# Patient Record
Sex: Female | Born: 1955 | Hispanic: No | Smoking: Former smoker
Health system: Southern US, Community
[De-identification: ages and names within clinical notes are randomized; demographics above are authoritative.]

## PROBLEM LIST (undated history)

## (undated) DIAGNOSIS — N3281 Overactive bladder: Secondary | ICD-10-CM

## (undated) DIAGNOSIS — E785 Hyperlipidemia, unspecified: Secondary | ICD-10-CM

## (undated) DIAGNOSIS — T7840XA Allergy, unspecified, initial encounter: Secondary | ICD-10-CM

## (undated) DIAGNOSIS — M8580 Other specified disorders of bone density and structure, unspecified site: Secondary | ICD-10-CM

## (undated) DIAGNOSIS — S42009A Fracture of unspecified part of unspecified clavicle, initial encounter for closed fracture: Secondary | ICD-10-CM

## (undated) DIAGNOSIS — D649 Anemia, unspecified: Secondary | ICD-10-CM

## (undated) DIAGNOSIS — J45909 Unspecified asthma, uncomplicated: Secondary | ICD-10-CM

## (undated) DIAGNOSIS — D72819 Decreased white blood cell count, unspecified: Secondary | ICD-10-CM

## (undated) HISTORY — DX: Hyperlipidemia, unspecified: E78.5

## (undated) HISTORY — DX: Decreased white blood cell count, unspecified: D72.819

## (undated) HISTORY — PX: ABDOMINAL HYSTERECTOMY: SHX81

## (undated) HISTORY — DX: Allergy, unspecified, initial encounter: T78.40XA

## (undated) HISTORY — DX: Other specified disorders of bone density and structure, unspecified site: M85.80

## (undated) HISTORY — DX: Unspecified asthma, uncomplicated: J45.909

---

## 2000-11-09 ENCOUNTER — Other Ambulatory Visit: Admission: RE | Admit: 2000-11-09 | Discharge: 2000-11-09 | Payer: Self-pay | Admitting: Family Medicine

## 2005-07-21 ENCOUNTER — Ambulatory Visit (HOSPITAL_COMMUNITY): Admission: RE | Admit: 2005-07-21 | Discharge: 2005-07-21 | Payer: Self-pay | Admitting: Gastroenterology

## 2005-09-10 ENCOUNTER — Ambulatory Visit: Payer: Self-pay | Admitting: Internal Medicine

## 2006-08-18 ENCOUNTER — Encounter: Payer: Self-pay | Admitting: Internal Medicine

## 2011-10-09 ENCOUNTER — Telehealth: Payer: Self-pay

## 2011-10-09 NOTE — Telephone Encounter (Signed)
I will set up the colonoscopy but I have to have a reason

## 2011-10-09 NOTE — Telephone Encounter (Signed)
Pt is going to come into the walkin clinic on 7/24 to see dr Milus Glazier. But pt wants to know if dr can go ahead and make a referral for the patient to have a colonoscopy Please call pt to advise

## 2011-10-10 NOTE — Telephone Encounter (Signed)
Left message on machine letting patient know we can set up a colonoscopy but to call back.  When patient calls back please ask if patient has had any GI symptoms or an abnormal colonoscopy in the past.

## 2011-10-10 NOTE — Telephone Encounter (Signed)
Pt CB and LM on nurse VM. Tried to call pt back but had to [facility] to CB

## 2011-10-22 ENCOUNTER — Ambulatory Visit (INDEPENDENT_AMBULATORY_CARE_PROVIDER_SITE_OTHER): Payer: BC Managed Care – PPO | Admitting: Family Medicine

## 2011-10-22 VITALS — BP 126/78 | HR 59 | Temp 98.9°F | Resp 16 | Ht 66.25 in | Wt 151.6 lb

## 2011-10-22 DIAGNOSIS — Z0000 Encounter for general adult medical examination without abnormal findings: Secondary | ICD-10-CM

## 2011-10-22 DIAGNOSIS — K635 Polyp of colon: Secondary | ICD-10-CM

## 2011-10-22 LAB — COMPREHENSIVE METABOLIC PANEL
ALT: 16 U/L (ref 0–35)
AST: 17 U/L (ref 0–37)
Albumin: 4.3 g/dL (ref 3.5–5.2)
Alkaline Phosphatase: 51 U/L (ref 39–117)
BUN: 12 mg/dL (ref 6–23)
CO2: 27 mEq/L (ref 19–32)
Calcium: 9.4 mg/dL (ref 8.4–10.5)
Chloride: 103 mEq/L (ref 96–112)
Creat: 0.6 mg/dL (ref 0.50–1.10)
Glucose, Bld: 84 mg/dL (ref 70–99)
Potassium: 4.1 mEq/L (ref 3.5–5.3)
Sodium: 137 mEq/L (ref 135–145)
Total Bilirubin: 0.5 mg/dL (ref 0.3–1.2)
Total Protein: 7.1 g/dL (ref 6.0–8.3)

## 2011-10-22 LAB — POCT CBC
Granulocyte percent: 64.8 %G (ref 37–80)
HCT, POC: 42.5 % (ref 37.7–47.9)
Hemoglobin: 12.9 g/dL (ref 12.2–16.2)
Lymph, poc: 1.3 (ref 0.6–3.4)
MCH, POC: 27.7 pg (ref 27–31.2)
MCHC: 30.4 g/dL — AB (ref 31.8–35.4)
MCV: 91.1 fL (ref 80–97)
MID (cbc): 0.2 (ref 0–0.9)
MPV: 8.7 fL (ref 0–99.8)
POC Granulocyte: 2.8 (ref 2–6.9)
POC LYMPH PERCENT: 30.3 %L (ref 10–50)
POC MID %: 4.9 %M (ref 0–12)
Platelet Count, POC: 311 10*3/uL (ref 142–424)
RBC: 4.66 M/uL (ref 4.04–5.48)
RDW, POC: 14.5 %
WBC: 4.3 10*3/uL — AB (ref 4.6–10.2)

## 2011-10-22 LAB — POCT UA - MICROSCOPIC ONLY
Casts, Ur, LPF, POC: NEGATIVE
Crystals, Ur, HPF, POC: NEGATIVE
Yeast, UA: NEGATIVE

## 2011-10-22 LAB — POCT URINALYSIS DIPSTICK
Bilirubin, UA: NEGATIVE
Blood, UA: NEGATIVE
Glucose, UA: NEGATIVE
Ketones, UA: NEGATIVE
Leukocytes, UA: NEGATIVE
Nitrite, UA: NEGATIVE
Protein, UA: NEGATIVE
Spec Grav, UA: 1.02
Urobilinogen, UA: 0.2
pH, UA: 5

## 2011-10-22 LAB — LIPID PANEL
Cholesterol: 272 mg/dL — ABNORMAL HIGH (ref 0–200)
HDL: 76 mg/dL (ref >39)
LDL Cholesterol: 163 mg/dL — ABNORMAL HIGH (ref 0–99)
Total CHOL/HDL Ratio: 3.6 Ratio
Triglycerides: 167 mg/dL — ABNORMAL HIGH (ref <150)
VLDL: 33 mg/dL (ref 0–40)

## 2011-10-22 MED ORDER — GLUCOSAMINE-CHONDROITIN 500-400 MG PO TABS: 1.0000 | ORAL_TABLET | ORAL | Status: DC

## 2011-10-22 MED ORDER — ESTRADIOL 2 MG PO TABS: 2.0000 mg | ORAL_TABLET | Freq: Every day | ORAL | Status: DC

## 2011-10-22 NOTE — Progress Notes (Signed)
@[email or web address] @  Patient ID: RUT BETTERTON MRN: [id number], DOB: 13-Oct-1955, 56 y.o. Date of Encounter: 10/22/2011, 9:11 AM  Primary Physician: No primary provider on file.  Chief Complaint: Physical (CPE)  HPI: 56 y.o. y/o female with history of noted below here for CPE.  Doing well. No issues/complaints.   Review of Systems: Consitutional: No fever, chills, fatigue, night sweats, lymphadenopathy, or weight changes. Eyes: No visual changes, eye redness, or discharge. ENT/Mouth: Ears: No otalgia, tinnitus, hearing loss, discharge. Nose: No congestion, rhinorrhea, sinus pain, or epistaxis. Throat: No sore throat, post nasal drip, or teeth pain. Cardiovascular: No CP, palpitations, diaphoresis, DOE, edema, orthopnea, PND. Respiratory: No cough, hemoptysis, SOB, or wheezing. Gastrointestinal: No anorexia, dysphagia, reflux, pain, nausea, vomiting, hematemesis, diarrhea, constipation, BRBPR, or melena. Breast: No discharge, pain, swelling, or mass. Genitourinary: No dysuria, frequency, urgency, hematuria, incontinence, nocturia, amenorrhea, vaginal discharge, pruritis, burning, abnormal bleeding, or pain. Musculoskeletal: No decreased ROM, myalgias, stiffness, joint swelling, or weakness. Skin: No rash, erythema, lesion changes, pain, warmth, jaundice, or pruritis. Neurological: No headache, dizziness, syncope, seizures, tremors, memory loss, coordination problems, or paresthesias. Psychological: No anxiety, depression, hallucinations, SI/HI. Endocrine: No fatigue, polydipsia, polyphagia, polyuria, or known diabetes. All other systems were reviewed and are otherwise negative.  No past medical history on file.   Past Surgical History  Procedure Date  . Abdominal hysterectomy     Home Meds:  Prior to Admission medications   Medication Sig Start Date End Date Taking? Authorizing Provider  estradiol (ESTRACE) 2 MG tablet Take 2 mg by mouth daily.   Yes Historical Provider, MD    glucosamine-chondroitin 500-400 MG tablet Take 1 tablet by mouth once a week.   Yes Historical Provider, MD    Allergies:  Allergies  Allergen Reactions  . Codeine     History   Social History  . Marital Status: Divorced    Spouse Name: N/A    Number of Children: N/A  . Years of Education: N/A   Occupational History  . Not on file.   Social History Main Topics  . Smoking status: Former Smoker    Quit date: 10/22/1991  . Smokeless tobacco: Not on file  . Alcohol Use: Not on file  . Drug Use: Not on file  . Sexually Active: Not on file   Other Topics Concern  . Not on file   Social History Narrative  . No narrative on file    Family History  Problem Relation Age of Onset  . Hyperlipidemia Mother   . Cancer Father     Physical Exam: Blood pressure 126/78, pulse 59, temperature 98.9 F (37.2 C), temperature source Oral, resp. rate 16, height 5' 6.25" (1.683 m), weight 151 lb 9.6 oz (68.765 kg), SpO2 98.00%., Body mass index is 24.28 kg/(m^2). General: Well developed, well nourished, in no acute distress. HEENT: Normocephalic, atraumatic. Conjunctiva pink, sclera non-icteric. Pupils 2 mm constricting to 1 mm, round, regular, and equally reactive to light and accomodation. EOMI. Internal auditory canal clear. TMs with good cone of light and without pathology. Nasal mucosa pink. Nares are without discharge. No sinus tenderness. Oral mucosa pink. Dentition good. Pharynx without exudate.   Neck: Supple. Trachea midline. No thyromegaly. Full ROM. No lymphadenopathy. Lungs: Clear to auscultation bilaterally without wheezes, rales, or rhonchi. Breathing is of normal effort and unlabored. Cardiovascular: RRR with S1 S2. No murmurs, rubs, or gallops appreciated. Distal pulses 2+ symmetrically. No carotid or abdominal bruits. Breast: Symmetrical. No masses. Nipples without discharge. Abdomen: Soft, non-tender, non-distended  with normoactive bowel sounds. No hepatosplenomegaly or  masses. No rebound/guarding. No CVA tenderness. Without hernias.   Musculoskeletal: Full range of motion and 5/5 strength throughout. Without swelling, atrophy, tenderness, crepitus, or warmth. Extremities without clubbing, cyanosis, or edema. Calves supple. Skin: Warm and moist without erythema, ecchymosis, wounds, or rash. Neuro: A+Ox3. CN II-XII grossly intact. Moves all extremities spontaneously. Full sensation throughout. Normal gait. DTR 2+ throughout upper and lower extremities. Finger to nose intact. Psych:  Responds to questions appropriately with a normal affect.   Studies: CMET  And lipidspending. Patient is doing well at present Results for orders placed in visit on 10/22/11  POCT CBC      Component Value Range   WBC 4.3 (*) 4.6 - 10.2 K/uL   Lymph, poc 1.3  0.6 - 3.4   POC LYMPH PERCENT 30.3  10 - 50 %L   MID (cbc) 0.2  0 - 0.9   POC MID % 4.9  0 - 12 %M   POC Granulocyte 2.8  2 - 6.9   Granulocyte percent 64.8  37 - 80 %G   RBC 4.66  4.04 - 5.48 M/uL   Hemoglobin 12.9  12.2 - 16.2 g/dL   HCT, POC 16.1  09.6 - 47.9 %   MCV 91.1  80 - 97 fL   MCH, POC 27.7  27 - 31.2 pg   MCHC 30.4 (*) 31.8 - 35.4 g/dL   RDW, POC 04.5     Platelet Count, POC 311  142 - 424 K/uL   MPV 8.7  0 - 99.8 fL  POCT UA - MICROSCOPIC ONLY      Component Value Range   WBC, Ur, HPF, POC 1-2     RBC, urine, microscopic 0-2     Bacteria, U Microscopic small     Mucus, UA tr     Epithelial cells, urine per micros 1-3     Crystals, Ur, HPF, POC neg     Casts, Ur, LPF, POC neg     Yeast, UA neg    POCT URINALYSIS DIPSTICK      Component Value Range   Color, UA yellow     Clarity, UA clear     Glucose, UA neg     Bilirubin, UA neg     Ketones, UA neg     Spec Grav, UA 1.020     Blood, UA neg     pH, UA 5.0     Protein, UA neg     Urobilinogen, UA 0.2     Nitrite, UA neg     Leukocytes, UA Negative       Assessment/Plan:  56 y.o. y/o female here for CPE with no new  problems -  Signed, Elvina Sidle, MD 10/22/2011 9:11 AM

## 2012-01-10 ENCOUNTER — Other Ambulatory Visit: Payer: Self-pay | Admitting: Family Medicine

## 2012-01-12 NOTE — Telephone Encounter (Signed)
Please pull paper chart.  

## 2012-01-13 NOTE — Telephone Encounter (Signed)
Chart pulled to PA pool at nurses station [phone]

## 2012-06-14 ENCOUNTER — Encounter: Payer: Self-pay | Admitting: Family Medicine

## 2012-11-11 ENCOUNTER — Encounter: Payer: Self-pay | Admitting: Family Medicine

## 2012-11-11 ENCOUNTER — Ambulatory Visit (INDEPENDENT_AMBULATORY_CARE_PROVIDER_SITE_OTHER): Payer: BC Managed Care – PPO | Admitting: Family Medicine

## 2012-11-11 VITALS — BP 113/75 | HR 83 | Temp 97.3°F | Resp 16 | Ht 65.5 in | Wt 154.0 lb

## 2012-11-11 DIAGNOSIS — Z0000 Encounter for general adult medical examination without abnormal findings: Secondary | ICD-10-CM

## 2012-11-11 LAB — CBC
HCT: 43.1 % (ref 36.0–46.0)
Hemoglobin: 14.6 g/dL (ref 12.0–15.0)
MCH: 28.6 pg (ref 26.0–34.0)
MCHC: 33.9 g/dL (ref 30.0–36.0)
MCV: 84.3 fL (ref 78.0–100.0)
Platelets: 347 10*3/uL (ref 150–400)
RBC: 5.11 MIL/uL (ref 3.87–5.11)
RDW: 13.8 % (ref 11.5–15.5)
WBC: 4.2 10*3/uL (ref 4.0–10.5)

## 2012-11-11 LAB — COMPREHENSIVE METABOLIC PANEL
ALT: 17 U/L (ref 0–35)
AST: 15 U/L (ref 0–37)
Albumin: 4.2 g/dL (ref 3.5–5.2)
Alkaline Phosphatase: 63 U/L (ref 39–117)
BUN: 11 mg/dL (ref 6–23)
CO2: 24 mEq/L (ref 19–32)
Calcium: 9.5 mg/dL (ref 8.4–10.5)
Chloride: 102 mEq/L (ref 96–112)
Creat: 0.68 mg/dL (ref 0.50–1.10)
Glucose, Bld: 90 mg/dL (ref 70–99)
Potassium: 4.3 mEq/L (ref 3.5–5.3)
Sodium: 135 mEq/L (ref 135–145)
Total Bilirubin: 0.4 mg/dL (ref 0.3–1.2)
Total Protein: 7 g/dL (ref 6.0–8.3)

## 2012-11-11 LAB — LIPID PANEL
Cholesterol: 279 mg/dL — ABNORMAL HIGH (ref 0–200)
HDL: 80 mg/dL (ref >39)
LDL Cholesterol: 171 mg/dL — ABNORMAL HIGH (ref 0–99)
Total CHOL/HDL Ratio: 3.5 Ratio
Triglycerides: 138 mg/dL (ref <150)
VLDL: 28 mg/dL (ref 0–40)

## 2012-11-11 NOTE — Progress Notes (Signed)
Patient ID: Megan Hooper MRN: [id number], DOB: March 31, 1956, 57 y.o. Date of Encounter: 11/11/2012, 11:47 AM  Primary Physician: No primary provider on file.  Chief Complaint: Physical (CPE)  HPI: 57 y.o. y/o female with history of noted below here for CPE.  Patient was engaged last year but she found that her future partner was a heavy drinker and one thing led to another and the marriage was called off. She is raising 2 adopted daughters, her nieces (brother died of drug addiction), and she's a Runner, broadcasting/film/video at [employer]. Her daughters will be attending [employer] this year. No new problems at this point  Years been very stressful because she hadn't leave the house she had bought with her proposed future partner, and her finances were quite strained. LMP:  S/P hysterectomy MMG:  Within a year Colonoscopy:  Referred but because of stress, postponed it.  Review of Systems: Consitutional: No fever, chills, fatigue, night sweats, lymphadenopathy, or weight changes. Eyes: No visual changes, eye redness, or discharge. ENT/Mouth: Ears: No otalgia, tinnitus, hearing loss, discharge. Nose: No congestion, rhinorrhea, sinus pain, or epistaxis. Throat: No sore throat, post nasal drip, or teeth pain. Cardiovascular: No CP, palpitations, diaphoresis, DOE, edema, orthopnea, PND. Respiratory: No cough, hemoptysis, SOB, or wheezing. Gastrointestinal: No anorexia, dysphagia, reflux, pain, nausea, vomiting, hematemesis, diarrhea, constipation, BRBPR, or melena. Breast: No discharge, pain, swelling, or mass. Genitourinary: No dysuria, frequency, urgency, hematuria, incontinence, nocturia, amenorrhea, vaginal discharge, pruritis, burning, abnormal bleeding, or pain. Musculoskeletal: No decreased ROM, myalgias, stiffness, joint swelling, or weakness. Skin: No rash, erythema, lesion changes, pain, warmth, jaundice, or pruritis. Neurological: No headache, dizziness, syncope, seizures, tremors, memory loss,  coordination problems, or paresthesias. Psychological: No anxiety, depression, hallucinations, SI/HI. Endocrine: No fatigue, polydipsia, polyphagia, polyuria, or known diabetes. All other systems were reviewed and are otherwise negative.  History reviewed. No pertinent past medical history.   Past Surgical History  Procedure Laterality Date  . Abdominal hysterectomy      Home Meds:  Prior to Admission medications   Medication Sig Start Date End Date Taking? Authorizing Provider  estradiol (ESTRACE) 2 MG tablet Take 1 tablet (2 mg total) by mouth daily. 10/22/11  Yes Elvina Sidle, MD  oxybutynin (DITROPAN-XL) 5 MG 24 hr tablet Take 5 mg by mouth daily.   Yes Historical Provider, MD  DITROPAN XL 5 MG 24 hr tablet TAKE 1 TABLET ONCE DAILY. 01/10/12   Heather Jaquita Rector, PA-C  glucosamine-chondroitin 500-400 MG tablet Take 1 tablet by mouth once a week. 10/22/11   Elvina Sidle, MD    Allergies:  Allergies  Allergen Reactions  . Codeine     History   Social History  . Marital Status: Divorced    Spouse Name: N/A    Number of Children: N/A  . Years of Education: N/A   Occupational History  . Not on file.   Social History Main Topics  . Smoking status: Former Smoker    Quit date: 10/22/1991  . Smokeless tobacco: Not on file  . Alcohol Use: Not on file  . Drug Use: Not on file  . Sexual Activity: Not on file   Other Topics Concern  . Not on file   Social History Narrative  . No narrative on file    Family History  Problem Relation Age of Onset  . Hyperlipidemia Mother   . Cancer Father     Physical Exam: Blood pressure 113/75, pulse 83, temperature 97.3 F (36.3 C), resp. rate 16, height 5'  5.5" (1.664 m), weight 154 lb (69.854 kg)., Body mass index is 25.23 kg/(m^2). General: Well developed, well nourished, in no acute distress. HEENT: Normocephalic, atraumatic. Conjunctiva pink, sclera non-icteric. Pupils 2 mm constricting to 1 mm, round, regular, and equally  reactive to light and accomodation. EOMI. Internal auditory canal clear. TMs with good cone of light and without pathology. Nasal mucosa pink. Nares are without discharge. No sinus tenderness. Oral mucosa pink. Dentition good . Pharynx without exudate.   Neck: Supple. Trachea midline. No thyromegaly. Full ROM. No lymphadenopathy. Lungs: Clear to auscultation bilaterally without wheezes, rales, or rhonchi. Breathing is of normal effort and unlabored. Cardiovascular: RRR with S1 S2. No murmurs, rubs, or gallops appreciated. Distal pulses 2+ symmetrically. No carotid or abdominal bruits. Breast: Symmetrical. No masses. Nipples without discharge. Abdomen: Soft, non-tender, non-distended with normoactive bowel sounds. No hepatosplenomegaly or masses. No rebound/guarding. No CVA tenderness. Without hernias.  Musculoskeletal: Full range of motion and 5/5 strength throughout. Without swelling, atrophy, tenderness, crepitus, or warmth. Extremities without clubbing, cyanosis, or edema. Calves supple. Skin: Warm and moist without erythema, ecchymosis, wounds, or rash. Neuro: A+Ox3. CN II-XII grossly intact. Moves all extremities spontaneously. Full sensation throughout. Normal gait. DTR 2+ throughout upper and lower extremities. Finger to nose intact. Psych:  Responds to questions appropriately with a normal affect.    Assessment/Plan:  57 y.o. y/o female here for CPE Annual physical exam - Plan: CBC, Comprehensive metabolic panel, Lipid panel   Signed, Elvina Sidle, MD 11/11/2012 11:47 AM

## 2012-12-26 ENCOUNTER — Other Ambulatory Visit: Payer: Self-pay | Admitting: Family Medicine

## 2013-01-26 ENCOUNTER — Encounter: Payer: Self-pay | Admitting: Family Medicine

## 2013-02-18 ENCOUNTER — Other Ambulatory Visit: Payer: Self-pay | Admitting: Physician Assistant

## 2013-04-23 ENCOUNTER — Other Ambulatory Visit: Payer: Self-pay | Admitting: Physician Assistant

## 2013-04-25 NOTE — Telephone Encounter (Signed)
Dr L, it looks like this pt comes to see you for CPEs. I don't see that you have ever Rxd this in EPIC. Do you want to RF for pt? I've pended w/note needs OV, but just refuse if not wanted.

## 2013-04-28 ENCOUNTER — Telehealth: Payer: Self-pay

## 2013-04-28 MED ORDER — OXYBUTYNIN CHLORIDE ER 5 MG PO TB24: ORAL_TABLET | ORAL | Status: DC

## 2013-04-28 NOTE — Telephone Encounter (Signed)
Patient needs a refill on Oxybutynin 5mg  sent to [facility].  [phone]

## 2013-04-28 NOTE — Telephone Encounter (Signed)
I have refilled the oxybutinin

## 2013-04-29 NOTE — Telephone Encounter (Signed)
Pt advised.

## 2013-09-03 ENCOUNTER — Other Ambulatory Visit: Payer: Self-pay | Admitting: Physician Assistant

## 2013-11-14 ENCOUNTER — Encounter: Payer: BC Managed Care – PPO | Admitting: Family Medicine

## 2013-12-01 ENCOUNTER — Encounter: Payer: Self-pay | Admitting: Family Medicine

## 2013-12-19 ENCOUNTER — Ambulatory Visit (INDEPENDENT_AMBULATORY_CARE_PROVIDER_SITE_OTHER): Payer: BC Managed Care – PPO | Admitting: Family Medicine

## 2013-12-19 ENCOUNTER — Encounter: Payer: Self-pay | Admitting: Family Medicine

## 2013-12-19 VITALS — BP 108/72 | HR 62 | Temp 97.6°F | Resp 16 | Ht 65.25 in | Wt 147.8 lb

## 2013-12-19 DIAGNOSIS — Z23 Encounter for immunization: Secondary | ICD-10-CM

## 2013-12-19 DIAGNOSIS — R82998 Other abnormal findings in urine: Secondary | ICD-10-CM

## 2013-12-19 DIAGNOSIS — Z0000 Encounter for general adult medical examination without abnormal findings: Secondary | ICD-10-CM

## 2013-12-19 DIAGNOSIS — Z131 Encounter for screening for diabetes mellitus: Secondary | ICD-10-CM

## 2013-12-19 DIAGNOSIS — Z13220 Encounter for screening for lipoid disorders: Secondary | ICD-10-CM

## 2013-12-19 DIAGNOSIS — Z124 Encounter for screening for malignant neoplasm of cervix: Secondary | ICD-10-CM

## 2013-12-19 DIAGNOSIS — R8281 Pyuria: Secondary | ICD-10-CM

## 2013-12-19 LAB — POCT URINALYSIS DIPSTICK
Bilirubin, UA: NEGATIVE
Blood, UA: NEGATIVE
Glucose, UA: NEGATIVE
Ketones, UA: NEGATIVE
Nitrite, UA: NEGATIVE
Protein, UA: NEGATIVE
Spec Grav, UA: 1.02
Urobilinogen, UA: 0.2
pH, UA: 5

## 2013-12-19 LAB — COMPLETE METABOLIC PANEL WITH GFR
ALT: 16 U/L (ref 0–35)
AST: 17 U/L (ref 0–37)
Albumin: 4.7 g/dL (ref 3.5–5.2)
Alkaline Phosphatase: 65 U/L (ref 39–117)
BUN: 14 mg/dL (ref 6–23)
CO2: 27 mEq/L (ref 19–32)
Calcium: 9.7 mg/dL (ref 8.4–10.5)
Chloride: 104 mEq/L (ref 96–112)
Creat: 0.68 mg/dL (ref 0.50–1.10)
GFR, Est African American: >89 mL/min
GFR, Est Non African American: >89 mL/min
Glucose, Bld: 85 mg/dL (ref 70–99)
Potassium: 4 mEq/L (ref 3.5–5.3)
Sodium: 139 mEq/L (ref 135–145)
Total Bilirubin: 0.6 mg/dL (ref 0.2–1.2)
Total Protein: 7.2 g/dL (ref 6.0–8.3)

## 2013-12-19 LAB — CBC WITH DIFFERENTIAL/PLATELET
Basophils Absolute: 0 10*3/uL (ref 0.0–0.1)
Basophils Relative: 0 % (ref 0–1)
Eosinophils Absolute: 0.2 10*3/uL (ref 0.0–0.7)
Eosinophils Relative: 6 % — ABNORMAL HIGH (ref 0–5)
HCT: 41.5 % (ref 36.0–46.0)
Hemoglobin: 14.3 g/dL (ref 12.0–15.0)
Lymphocytes Relative: 39 % (ref 12–46)
Lymphs Abs: 1.2 10*3/uL (ref 0.7–4.0)
MCH: 29.5 pg (ref 26.0–34.0)
MCHC: 34.5 g/dL (ref 30.0–36.0)
MCV: 85.7 fL (ref 78.0–100.0)
Monocytes Absolute: 0.2 10*3/uL (ref 0.1–1.0)
Monocytes Relative: 5 % (ref 3–12)
Neutro Abs: 1.6 10*3/uL — ABNORMAL LOW (ref 1.7–7.7)
Neutrophils Relative %: 50 % (ref 43–77)
Platelets: 274 10*3/uL (ref 150–400)
RBC: 4.84 MIL/uL (ref 3.87–5.11)
RDW: 13.1 % (ref 11.5–15.5)
WBC: 3.1 10*3/uL — ABNORMAL LOW (ref 4.0–10.5)

## 2013-12-19 LAB — POCT UA - MICROSCOPIC ONLY
Casts, Ur, LPF, POC: NEGATIVE
Crystals, Ur, HPF, POC: NEGATIVE
Mucus, UA: POSITIVE
Yeast, UA: NEGATIVE

## 2013-12-19 LAB — LIPID PANEL
Cholesterol: 259 mg/dL — ABNORMAL HIGH (ref 0–200)
HDL: 72 mg/dL (ref >39)
LDL Cholesterol: 165 mg/dL — ABNORMAL HIGH (ref 0–99)
Total CHOL/HDL Ratio: 3.6 Ratio
Triglycerides: 112 mg/dL (ref <150)
VLDL: 22 mg/dL (ref 0–40)

## 2013-12-19 LAB — POCT GLYCOSYLATED HEMOGLOBIN (HGB A1C): Hemoglobin A1C: 5.3

## 2013-12-19 LAB — IFOBT (OCCULT BLOOD): IFOBT: NEGATIVE

## 2013-12-19 MED ORDER — ESTRADIOL 2 MG PO TABS: ORAL_TABLET | ORAL | Status: DC

## 2013-12-19 MED ORDER — OXYBUTYNIN CHLORIDE ER 5 MG PO TB24: 5.0000 mg | ORAL_TABLET | Freq: Every day | ORAL | Status: DC

## 2013-12-19 NOTE — Progress Notes (Signed)
   Subjective:    Patient ID: Megan Hooper, female    DOB: [birth year], 58 y.o.   MRN: [id number]  HPI    Review of Systems  Constitutional: Positive for diaphoresis.  HENT: Negative.   Eyes: Negative.   Respiratory: Negative.   Cardiovascular: Negative.   Gastrointestinal: Negative.   Endocrine: Negative.   Genitourinary: Negative.   Musculoskeletal: Positive for arthralgias.  Skin: Negative.   Allergic/Immunologic: Negative.   Neurological: Negative.   Hematological: Negative.   Psychiatric/Behavioral: Negative.        Objective:   Physical Exam        Assessment & Plan:

## 2013-12-19 NOTE — Progress Notes (Signed)
@UMFCLOGO @  Patient ID: Megan Hooper MRN: [id number], DOB: 06-24-1955, 58 y.o. Date of Encounter: 12/19/2013, 8:24 AM  Primary Physician: No primary provider on file.  Chief Complaint:  Need for annual exam  HPI: 58 y.o. year old female with history below who presents to [facility] in need of a physical.   Works at [employer]; has two adopted teenage daughters at [place].  She is considering working two more years, as her job is getting very stressful and unrewarding. She is still single. Walks for exercise. We discussed the colonoscopy.  Patient is frightened of this and, despite my appeal, has decided against proceeding with this. She has recently had a mammogram.   Past Medical History  Diagnosis Date  . Osteopenia   . Asthma   . Leukopenia     mild  . Dyslipidemia   . Allergy      Home Meds: Prior to Admission medications   Medication Sig Start Date End Date Taking? Authorizing Provider  glucosamine-chondroitin 500-400 MG tablet Take 1 tablet by mouth once a week. 10/22/11  Yes Robyn Haber, MD  oxybutynin (DITROPAN-XL) 5 MG 24 hr tablet Take 5 mg by mouth daily.   Yes Historical Provider, MD  oxybutynin (DITROPAN-XL) 5 MG 24 hr tablet One daily 04/28/13  Yes Robyn Haber, MD  estradiol (ESTRACE) 2 MG tablet TAKE 1 TABLET ONCE DAILY. 02/18/13   Rise Mu, PA-C    Allergies:  Allergies  Allergen Reactions  . Codeine     History   Social History  . Marital Status: Divorced    Spouse Name: N/A    Number of Children: N/A  . Years of Education: N/A   Occupational History  . teacher    Social History Main Topics  . Smoking status: Former Smoker    Quit date: 10/22/1991  . Smokeless tobacco: Not on file  . Alcohol Use: Not on file  . Drug Use: Not on file  . Sexual Activity: Not on file   Other Topics Concern  . Not on file   Social History Narrative  . No narrative on file     Review of Systems: Constitutional: negative for chills, fever, night  sweats, weight changes, or fatigue  HEENT: negative for vision changes, hearing loss, congestion, rhinorrhea, ST, epistaxis, or sinus pressure Cardiovascular: negative for chest pain or palpitations Respiratory: negative for hemoptysis, wheezing, shortness of breath, or cough Abdominal: negative for abdominal pain, nausea, vomiting, diarrhea, or constipation Dermatological: negative for rash Neurologic: negative for headache, dizziness, or syncope All other systems reviewed and are otherwise negative with the exception to those above and in the HPI.   Physical Exam: Blood pressure 108/72, pulse 62, temperature 97.6 F (36.4 C), temperature source Oral, resp. rate 16, height 5' 5.25" (1.657 m), weight 147 lb 12.8 oz (67.042 kg), SpO2 99.00%., Body mass index is 24.42 kg/(m^2). General: Well developed, well nourished, in no acute distress. Head: Normocephalic, atraumatic, eyes without discharge, sclera non-icteric, nares are without discharge. Bilateral auditory canals clear, TM's are without perforation, pearly grey and translucent with reflective cone of light bilaterally. Oral cavity moist, posterior pharynx without exudate, erythema, peritonsillar abscess, or post nasal drip.  Neck: Supple. No thyromegaly. Full ROM. No lymphadenopathy. Lungs: Clear bilaterally to auscultation without wheezes, rales, or rhonchi. Breathing is unlabored. Heart: RRR with S1 S2. No murmurs, rubs, or gallops appreciated. Abdomen: Soft, non-tender, non-distended with normoactive bowel sounds. No hepatomegaly. No rebound/guarding. No obvious abdominal masses. Msk:  Strength and tone normal  for age. Extremities/Skin: Warm and dry. No clubbing or cyanosis. No edema. No rashes or suspicious lesions. Neuro: Alert and oriented X 3. Moves all extremities spontaneously. Gait is normal. CNII-XII grossly in tact. Psych:  Responds to questions appropriately with a normal affect.   Breast exam:  No suspicious nodules or  masses Axillae:  No adenopathy Pelvic:  NEFG, normal vagina, s/p hyst, tender bimanual with no masses. Results for orders placed in visit on 12/19/13  IFOBT (OCCULT BLOOD)      Result Value Ref Range   IFOBT Negative    POCT UA - MICROSCOPIC ONLY      Result Value Ref Range   WBC, Ur, HPF, POC 2-4     RBC, urine, microscopic 8-9     Bacteria, U Microscopic 2+     Mucus, UA pos     Epithelial cells, urine per micros 2-3     Crystals, Ur, HPF, POC neg     Casts, Ur, LPF, POC neg     Yeast, UA neg    POCT URINALYSIS DIPSTICK      Result Value Ref Range   Color, UA yellow     Clarity, UA clear     Glucose, UA neg     Bilirubin, UA neg     Ketones, UA neg     Spec Grav, UA 1.020     Blood, UA neg     pH, UA 5.0     Protein, UA neg     Urobilinogen, UA 0.2     Nitrite, UA neg     Leukocytes, UA moderate (2+)    POCT GLYCOSYLATED HEMOGLOBIN (HGB A1C)      Result Value Ref Range   Hemoglobin A1C 5.3       ASSESSMENT AND PLAN:  58 y.o. year old female with Screening for cholesterol level - Plan: Lipid panel  Routine general medical examination at a health care facility - Plan: IFOBT POC (occult bld, rslt in office), POCT UA - Microscopic Only, POCT urinalysis dipstick, CBC with Differential, COMPLETE METABOLIC PANEL WITH GFR, Lipid panel, Pap IG (Image Guided), CANCELED: Pap IG (Image Guided)  Screening for diabetes mellitus - Plan: POCT glycosylated hemoglobin (Hb A1C)  Given info on colonoscopy.   Signed, Robyn Haber, MD 12/19/2013 8:24 AM

## 2013-12-19 NOTE — Patient Instructions (Signed)
Colonoscopy A colonoscopy is an exam to look at the entire large intestine (colon). This exam can help find problems such as tumors, polyps, inflammation, and areas of bleeding. The exam takes about 1 hour.  LET [facility] CARE PROVIDER KNOW ABOUT:   Any allergies you have.  All medicines you are taking, including vitamins, herbs, eye drops, creams, and over-the-counter medicines.  Previous problems you or members of your family have had with the use of anesthetics.  Any blood disorders you have.  Previous surgeries you have had.  Medical conditions you have. RISKS AND COMPLICATIONS  Generally, this is a safe procedure. However, as with any procedure, complications can occur. Possible complications include:  Bleeding.  Tearing or rupture of the colon wall.  Reaction to medicines given during the exam.  Infection (rare). BEFORE THE PROCEDURE   Ask your health care provider about changing or stopping your regular medicines.  You may be prescribed an oral bowel prep. This involves drinking a large amount of medicated liquid, starting the day before your procedure. The liquid will cause you to have multiple loose stools until your stool is almost clear or light green. This cleans out your colon in preparation for the procedure.  Do not eat or drink anything else once you have started the bowel prep, unless your health care provider tells you it is safe to do so.  Arrange for someone to drive you home after the procedure. PROCEDURE   You will be given medicine to help you relax (sedative).  You will lie on your side with your knees bent.  A long, flexible tube with a light and camera on the end (colonoscope) will be inserted through the rectum and into the colon. The camera sends video back to a computer screen as it moves through the colon. The colonoscope also releases carbon dioxide gas to inflate the colon. This helps your health care provider see the area better.  During  the exam, your health care provider may take a small tissue sample (biopsy) to be examined under a microscope if any abnormalities are found.  The exam is finished when the entire colon has been viewed. AFTER THE PROCEDURE   Do not drive for 24 hours after the exam.  You may have a small amount of blood in your stool.  You may pass moderate amounts of gas and have mild abdominal cramping or bloating. This is caused by the gas used to inflate your colon during the exam.  Ask when your test results will be ready and how you will get your results. Make sure you get your test results. Document Released: 03/14/2000 Document Revised: 01/05/2013 Document Reviewed: 11/22/2012 [facility] Patient Information 2015 [place], [place]. This information is not intended to replace advice given to you by your health care provider. Make sure you discuss any questions you have with your health care provider. Health Maintenance Adopting a healthy lifestyle and getting preventive care can go a long way to promote health and wellness. Talk with your health care provider about what schedule of regular examinations is right for you. This is a good chance for you to check in with your provider about disease prevention and staying healthy. In between checkups, there are plenty of things you can do on your own. Experts have done a lot of research about which lifestyle changes and preventive measures are most likely to keep you healthy. Ask your health care provider for more information. WEIGHT AND DIET  Eat a healthy diet  Be sure to include plenty of vegetables, fruits, low-fat dairy products, and lean protein.  Do not eat a lot of foods high in solid fats, added sugars, or salt.  Get regular exercise. This is one of the most important things you can do for your health.  Most adults should exercise for at least 150 minutes each week. The exercise should increase your heart rate and make you sweat (moderate-intensity  exercise).  Most adults should also do strengthening exercises at least twice a week. This is in addition to the moderate-intensity exercise.  Maintain a healthy weight  Body mass index (BMI) is a measurement that can be used to identify possible weight problems. It estimates body fat based on height and weight. Your health care provider can help determine your BMI and help you achieve or maintain a healthy weight.  For females 60 years of age and older:   A BMI below 18.5 is considered underweight.  A BMI of 18.5 to 24.9 is normal.  A BMI of 25 to 29.9 is considered overweight.  A BMI of 30 and above is considered obese.  Watch levels of cholesterol and blood lipids  You should start having your blood tested for lipids and cholesterol at 57 years of age, then have this test every 5 years.  You may need to have your cholesterol levels checked more often if:  Your lipid or cholesterol levels are high.  You are older than 57 years of age.  You are at high risk for heart disease.  CANCER SCREENING   Lung Cancer  Lung cancer screening is recommended for adults 81-9 years old who are at high risk for lung cancer because of a history of smoking.  A yearly low-dose CT scan of the lungs is recommended for people who:  Currently smoke.  Have quit within the past 15 years.  Have at least a 30-pack-year history of smoking. A pack year is smoking an average of one pack of cigarettes a day for 1 year.  Yearly screening should continue until it has been 15 years since you quit.  Yearly screening should stop if you develop a health problem that would prevent you from having lung cancer treatment.  Breast Cancer  Practice breast self-awareness. This means understanding how your breasts normally appear and feel.  It also means doing regular breast self-exams. Let your health care provider know about any changes, no matter how small.  If you are in your 20s or 30s, you should  have a clinical breast exam (CBE) by a health care provider every 1-3 years as part of a regular health exam.  If you are 101 or older, have a CBE every year. Also consider having a breast X-ray (mammogram) every year.  If you have a family history of breast cancer, talk to your health care provider about genetic screening.  If you are at high risk for breast cancer, talk to your health care provider about having an MRI and a mammogram every year.  Breast cancer gene (BRCA) assessment is recommended for women who have family members with BRCA-related cancers. BRCA-related cancers include:  Breast.  Ovarian.  Tubal.  Peritoneal cancers.  Results of the assessment will determine the need for genetic counseling and BRCA1 and BRCA2 testing. Cervical Cancer Routine pelvic examinations to screen for cervical cancer are no longer recommended for nonpregnant women who are considered low risk for cancer of the pelvic organs (ovaries, uterus, and vagina) and who do not have symptoms. A  pelvic examination may be necessary if you have symptoms including those associated with pelvic infections. Ask your health care provider if a screening pelvic exam is right for you.   The Pap test is the screening test for cervical cancer for women who are considered at risk.  If you had a hysterectomy for a problem that was not cancer or a condition that could lead to cancer, then you no longer need Pap tests.  If you are older than 65 years, and you have had normal Pap tests for the past 10 years, you no longer need to have Pap tests.  If you have had past treatment for cervical cancer or a condition that could lead to cancer, you need Pap tests and screening for cancer for at least 20 years after your treatment.  If you no longer get a Pap test, assess your risk factors if they change (such as having a new sexual partner). This can affect whether you should start being screened again.  Some women have medical  problems that increase their chance of getting cervical cancer. If this is the case for you, your health care provider may recommend more frequent screening and Pap tests.  The human papillomavirus (HPV) test is another test that may be used for cervical cancer screening. The HPV test looks for the virus that can cause cell changes in the cervix. The cells collected during the Pap test can be tested for HPV.  The HPV test can be used to screen women 10 years of age and older. Getting tested for HPV can extend the interval between normal Pap tests from three to five years.  An HPV test also should be used to screen women of any age who have unclear Pap test results.  After 57 years of age, women should have HPV testing as often as Pap tests.  Colorectal Cancer  This type of cancer can be detected and often prevented.  Routine colorectal cancer screening usually begins at 57 years of age and continues through 57 years of age.  Your health care provider may recommend screening at an earlier age if you have risk factors for colon cancer.  Your health care provider may also recommend using home test kits to check for hidden blood in the stool.  A small camera at the end of a tube can be used to examine your colon directly (sigmoidoscopy or colonoscopy). This is done to check for the earliest forms of colorectal cancer.  Routine screening usually begins at age 31.  Direct examination of the colon should be repeated every 5-10 years through 57 years of age. However, you may need to be screened more often if early forms of precancerous polyps or small growths are found. Skin Cancer  Check your skin from head to toe regularly.  Tell your health care provider about any new moles or changes in moles, especially if there is a change in a mole's shape or color.  Also tell your health care provider if you have a mole that is larger than the size of a pencil eraser.  Always use sunscreen. Apply  sunscreen liberally and repeatedly throughout the day.  Protect yourself by wearing long sleeves, pants, a wide-brimmed hat, and sunglasses whenever you are outside. HEART DISEASE, DIABETES, AND HIGH BLOOD PRESSURE   Have your blood pressure checked at least every 1-2 years. High blood pressure causes heart disease and increases the risk of stroke.  If you are between 13 years and 91 years old,  ask your health care provider if you should take aspirin to prevent strokes.  Have regular diabetes screenings. This involves taking a blood sample to check your fasting blood sugar level.  If you are at a normal weight and have a low risk for diabetes, have this test once every three years after 57 years of age.  If you are overweight and have a high risk for diabetes, consider being tested at a younger age or more often. PREVENTING INFECTION  Hepatitis B  If you have a higher risk for hepatitis B, you should be screened for this virus. You are considered at high risk for hepatitis B if:  You were born in a country where hepatitis B is common. Ask your health care provider which countries are considered high risk.  Your parents were born in a high-risk country, and you have not been immunized against hepatitis B (hepatitis B vaccine).  You have HIV or AIDS.  You use needles to inject street drugs.  You live with someone who has hepatitis B.  You have had sex with someone who has hepatitis B.  You get hemodialysis treatment.  You take certain medicines for conditions, including cancer, organ transplantation, and autoimmune conditions. Hepatitis C  Blood testing is recommended for:  Everyone born from [birth year] through 1964.  Anyone with known risk factors for hepatitis C. Sexually transmitted infections (STIs)  You should be screened for sexually transmitted infections (STIs) including gonorrhea and chlamydia if:  You are sexually active and are younger than 57 years of age.  You are  older than 57 years of age and your health care provider tells you that you are at risk for this type of infection.  Your sexual activity has changed since you were last screened and you are at an increased risk for chlamydia or gonorrhea. Ask your health care provider if you are at risk.  If you do not have HIV, but are at risk, it may be recommended that you take a prescription medicine daily to prevent HIV infection. This is called pre-exposure prophylaxis (PrEP). You are considered at risk if:  You are sexually active and do not regularly use condoms or know the HIV status of your partner(s).  You take drugs by injection.  You are sexually active with a partner who has HIV. Talk with your health care provider about whether you are at high risk of being infected with HIV. If you choose to begin PrEP, you should first be tested for HIV. You should then be tested every 3 months for as long as you are taking PrEP.  PREGNANCY   If you are premenopausal and you may become pregnant, ask your health care provider about preconception counseling.  If you may become pregnant, take 400 to 800 micrograms (mcg) of folic acid every day.  If you want to prevent pregnancy, talk to your health care provider about birth control (contraception). OSTEOPOROSIS AND MENOPAUSE   Osteoporosis is a disease in which the bones lose minerals and strength with aging. This can result in serious bone fractures. Your risk for osteoporosis can be identified using a bone density scan.  If you are 65 years of age or older, or if you are at risk for osteoporosis and fractures, ask your health care provider if you should be screened.  Ask your health care provider whether you should take a calcium or vitamin D supplement to lower your risk for osteoporosis.  Menopause may have certain physical symptoms and risks.  Hormone replacement therapy may reduce some of these symptoms and risks. Talk to your health care provider  about whether hormone replacement therapy is right for you.  HOME CARE INSTRUCTIONS   Schedule regular health, dental, and eye exams.  Stay current with your immunizations.   Do not use any tobacco products including cigarettes, chewing tobacco, or electronic cigarettes.  If you are pregnant, do not drink alcohol.  If you are breastfeeding, limit how much and how often you drink alcohol.  Limit alcohol intake to no more than 1 drink per day for nonpregnant women. One drink equals 12 ounces of beer, 5 ounces of wine, or 1 ounces of hard liquor.  Do not use street drugs.  Do not share needles.  Ask your health care provider for help if you need support or information about quitting drugs.  Tell your health care provider if you often feel depressed.  Tell your health care provider if you have ever been abused or do not feel safe at home. Document Released: 09/30/2010 Document Revised: 08/01/2013 Document Reviewed: 02/16/2013 [facility] Patient Information 2015 [place], [place]. This information is not intended to replace advice given to you by your health care provider. Make sure you discuss any questions you have with your health care provider.

## 2013-12-20 LAB — PAP IG (IMAGE GUIDED)

## 2013-12-20 LAB — URINE CULTURE
Colony Count: NO GROWTH
Organism ID, Bacteria: NO GROWTH

## 2014-09-19 ENCOUNTER — Encounter: Payer: Self-pay | Admitting: Physician Assistant

## 2014-09-19 ENCOUNTER — Other Ambulatory Visit: Payer: Self-pay | Admitting: Physician Assistant

## 2014-09-19 ENCOUNTER — Ambulatory Visit (INDEPENDENT_AMBULATORY_CARE_PROVIDER_SITE_OTHER): Payer: BC Managed Care – PPO | Admitting: Physician Assistant

## 2014-09-19 VITALS — BP 114/77 | HR 75 | Temp 97.8°F | Resp 16 | Ht 64.5 in | Wt 150.0 lb

## 2014-09-19 DIAGNOSIS — D72819 Decreased white blood cell count, unspecified: Secondary | ICD-10-CM | POA: Diagnosis not present

## 2014-09-19 DIAGNOSIS — E785 Hyperlipidemia, unspecified: Secondary | ICD-10-CM

## 2014-09-19 DIAGNOSIS — Z1211 Encounter for screening for malignant neoplasm of colon: Secondary | ICD-10-CM | POA: Diagnosis not present

## 2014-09-19 DIAGNOSIS — M8580 Other specified disorders of bone density and structure, unspecified site: Secondary | ICD-10-CM

## 2014-09-19 DIAGNOSIS — Z87448 Personal history of other diseases of urinary system: Secondary | ICD-10-CM

## 2014-09-19 DIAGNOSIS — Z1389 Encounter for screening for other disorder: Secondary | ICD-10-CM

## 2014-09-19 DIAGNOSIS — N951 Menopausal and female climacteric states: Secondary | ICD-10-CM

## 2014-09-19 DIAGNOSIS — R232 Flushing: Secondary | ICD-10-CM

## 2014-09-19 DIAGNOSIS — Z0000 Encounter for general adult medical examination without abnormal findings: Secondary | ICD-10-CM

## 2014-09-19 LAB — CBC
HCT: 39.4 % (ref 36.0–46.0)
Hemoglobin: 12.9 g/dL (ref 12.0–15.0)
MCH: 29.5 pg (ref 26.0–34.0)
MCHC: 32.7 g/dL (ref 30.0–36.0)
MCV: 90 fL (ref 78.0–100.0)
MPV: 9.5 fL (ref 8.6–12.4)
Platelets: 293 10*3/uL (ref 150–400)
RBC: 4.38 MIL/uL (ref 3.87–5.11)
RDW: 13.6 % (ref 11.5–15.5)
WBC: 2.8 10*3/uL — ABNORMAL LOW (ref 4.0–10.5)

## 2014-09-19 LAB — POCT UA - MICROSCOPIC ONLY
Bacteria, U Microscopic: NEGATIVE
CASTS, UR, LPF, POC: NEGATIVE
CRYSTALS, UR, HPF, POC: NEGATIVE
Epithelial cells, urine per micros: NEGATIVE
MUCUS UA: NEGATIVE
RBC, urine, microscopic: NEGATIVE

## 2014-09-19 LAB — LIPID PANEL
CHOLESTEROL: 281 mg/dL — AB (ref 0–200)
HDL: 86 mg/dL (ref >=46)
LDL Cholesterol: 157 mg/dL — ABNORMAL HIGH (ref 0–99)
Total CHOL/HDL Ratio: 3.3 Ratio
Triglycerides: 188 mg/dL — ABNORMAL HIGH (ref <150)
VLDL: 38 mg/dL (ref 0–40)

## 2014-09-19 LAB — COMPREHENSIVE METABOLIC PANEL
ALT: 12 U/L (ref 0–35)
AST: 15 U/L (ref 0–37)
Albumin: 4.1 g/dL (ref 3.5–5.2)
Alkaline Phosphatase: 45 U/L (ref 39–117)
BUN: 10 mg/dL (ref 6–23)
CALCIUM: 9.3 mg/dL (ref 8.4–10.5)
CO2: 29 mEq/L (ref 19–32)
CREATININE: 0.62 mg/dL (ref 0.50–1.10)
Chloride: 104 mEq/L (ref 96–112)
Glucose, Bld: 95 mg/dL (ref 70–99)
Potassium: 4.9 mEq/L (ref 3.5–5.3)
Sodium: 143 mEq/L (ref 135–145)
Total Bilirubin: 0.4 mg/dL (ref 0.2–1.2)
Total Protein: 6.7 g/dL (ref 6.0–8.3)

## 2014-09-19 LAB — POCT URINALYSIS DIPSTICK
Bilirubin, UA: NEGATIVE
GLUCOSE UA: NEGATIVE
Nitrite, UA: NEGATIVE
Protein, UA: NEGATIVE
RBC UA: NEGATIVE
Spec Grav, UA: 1.02
UROBILINOGEN UA: 0.2
pH, UA: 5

## 2014-09-19 MED ORDER — OXYBUTYNIN CHLORIDE ER 5 MG PO TB24: 5.0000 mg | ORAL_TABLET | Freq: Every day | ORAL | Status: DC

## 2014-09-19 MED ORDER — ESTRADIOL 2 MG PO TABS: ORAL_TABLET | ORAL | Status: DC

## 2014-09-19 NOTE — Patient Instructions (Addendum)
Your exam was normal today. We have referred you for a bone density scan today. Please obtain a stool sample and return to clinic. We drew labs today and are checking urine today, I'll let you know these results.  I have refilled your meds for one year. As we discussed let's try to decrease the estradiol gradually over time.    Health Maintenance Adopting a healthy lifestyle and getting preventive care can go a long way to promote health and wellness. Talk with your health care provider about what schedule of regular examinations is right for you. This is a good chance for you to check in with your provider about disease prevention and staying healthy. In between checkups, there are plenty of things you can do on your own. Experts have done a lot of research about which lifestyle changes and preventive measures are most likely to keep you healthy. Ask your health care provider for more information. WEIGHT AND DIET  Eat a healthy diet  Be sure to include plenty of vegetables, fruits, low-fat dairy products, and lean protein.  Do not eat a lot of foods high in solid fats, added sugars, or salt.  Get regular exercise. This is one of the most important things you can do for your health.  Most adults should exercise for at least 150 minutes each week. The exercise should increase your heart rate and make you sweat (moderate-intensity exercise).  Most adults should also do strengthening exercises at least twice a week. This is in addition to the moderate-intensity exercise.  Maintain a healthy weight  Body mass index (BMI) is a measurement that can be used to identify possible weight problems. It estimates body fat based on height and weight. Your health care provider can help determine your BMI and help you achieve or maintain a healthy weight.  For females 72 years of age and older:   A BMI below 18.5 is considered underweight.  A BMI of 18.5 to 24.9 is normal.  A BMI of 25 to 29.9 is  considered overweight.  A BMI of 30 and above is considered obese.  Watch levels of cholesterol and blood lipids  You should start having your blood tested for lipids and cholesterol at 58 years of age, then have this test every 5 years.  You may need to have your cholesterol levels checked more often if:  Your lipid or cholesterol levels are high.  You are older than 58 years of age.  You are at high risk for heart disease.  CANCER SCREENING   Lung Cancer  Lung cancer screening is recommended for adults 43-45 years old who are at high risk for lung cancer because of a history of smoking.  A yearly low-dose CT scan of the lungs is recommended for people who:  Currently smoke.  Have quit within the past 15 years.  Have at least a 30-pack-year history of smoking. A pack year is smoking an average of one pack of cigarettes a day for 1 year.  Yearly screening should continue until it has been 15 years since you quit.  Yearly screening should stop if you develop a health problem that would prevent you from having lung cancer treatment.  Breast Cancer  Practice breast self-awareness. This means understanding how your breasts normally appear and feel.  It also means doing regular breast self-exams. Let your health care provider know about any changes, no matter how small.  If you are in your 20s or 30s, you should have a  clinical breast exam (CBE) by a health care provider every 1-3 years as part of a regular health exam.  If you are 35 or older, have a CBE every year. Also consider having a breast X-ray (mammogram) every year.  If you have a family history of breast cancer, talk to your health care provider about genetic screening.  If you are at high risk for breast cancer, talk to your health care provider about having an MRI and a mammogram every year.  Breast cancer gene (BRCA) assessment is recommended for women who have family members with BRCA-related cancers.  BRCA-related cancers include:  Breast.  Ovarian.  Tubal.  Peritoneal cancers.  Results of the assessment will determine the need for genetic counseling and BRCA1 and BRCA2 testing. Cervical Cancer Routine pelvic examinations to screen for cervical cancer are no longer recommended for nonpregnant women who are considered low risk for cancer of the pelvic organs (ovaries, uterus, and vagina) and who do not have symptoms. A pelvic examination may be necessary if you have symptoms including those associated with pelvic infections. Ask your health care provider if a screening pelvic exam is right for you.   The Pap test is the screening test for cervical cancer for women who are considered at risk.  If you had a hysterectomy for a problem that was not cancer or a condition that could lead to cancer, then you no longer need Pap tests.  If you are older than 65 years, and you have had normal Pap tests for the past 10 years, you no longer need to have Pap tests.  If you have had past treatment for cervical cancer or a condition that could lead to cancer, you need Pap tests and screening for cancer for at least 20 years after your treatment.  If you no longer get a Pap test, assess your risk factors if they change (such as having a new sexual partner). This can affect whether you should start being screened again.  Some women have medical problems that increase their chance of getting cervical cancer. If this is the case for you, your health care provider may recommend more frequent screening and Pap tests.  The human papillomavirus (HPV) test is another test that may be used for cervical cancer screening. The HPV test looks for the virus that can cause cell changes in the cervix. The cells collected during the Pap test can be tested for HPV.  The HPV test can be used to screen women 47 years of age and older. Getting tested for HPV can extend the interval between normal Pap tests from three to  five years.  An HPV test also should be used to screen women of any age who have unclear Pap test results.  After 58 years of age, women should have HPV testing as often as Pap tests.  Colorectal Cancer  This type of cancer can be detected and often prevented.  Routine colorectal cancer screening usually begins at 58 years of age and continues through 58 years of age.  Your health care provider may recommend screening at an earlier age if you have risk factors for colon cancer.  Your health care provider may also recommend using home test kits to check for hidden blood in the stool.  A small camera at the end of a tube can be used to examine your colon directly (sigmoidoscopy or colonoscopy). This is done to check for the earliest forms of colorectal cancer.  Routine screening usually begins at  age 5.  Direct examination of the colon should be repeated every 5-10 years through 58 years of age. However, you may need to be screened more often if early forms of precancerous polyps or small growths are found. Skin Cancer  Check your skin from head to toe regularly.  Tell your health care provider about any new moles or changes in moles, especially if there is a change in a mole's shape or color.  Also tell your health care provider if you have a mole that is larger than the size of a pencil eraser.  Always use sunscreen. Apply sunscreen liberally and repeatedly throughout the day.  Protect yourself by wearing long sleeves, pants, a wide-brimmed hat, and sunglasses whenever you are outside. HEART DISEASE, DIABETES, AND HIGH BLOOD PRESSURE   Have your blood pressure checked at least every 1-2 years. High blood pressure causes heart disease and increases the risk of stroke.  If you are between 70 years and 61 years old, ask your health care provider if you should take aspirin to prevent strokes.  Have regular diabetes screenings. This involves taking a blood sample to check your  fasting blood sugar level.  If you are at a normal weight and have a low risk for diabetes, have this test once every three years after 58 years of age.  If you are overweight and have a high risk for diabetes, consider being tested at a younger age or more often. PREVENTING INFECTION  Hepatitis B  If you have a higher risk for hepatitis B, you should be screened for this virus. You are considered at high risk for hepatitis B if:  You were born in a country where hepatitis B is common. Ask your health care provider which countries are considered high risk.  Your parents were born in a high-risk country, and you have not been immunized against hepatitis B (hepatitis B vaccine).  You have HIV or AIDS.  You use needles to inject street drugs.  You live with someone who has hepatitis B.  You have had sex with someone who has hepatitis B.  You get hemodialysis treatment.  You take certain medicines for conditions, including cancer, organ transplantation, and autoimmune conditions. Hepatitis C  Blood testing is recommended for:  Everyone born from [birth year] through 1964.  Anyone with known risk factors for hepatitis C. Sexually transmitted infections (STIs)  You should be screened for sexually transmitted infections (STIs) including gonorrhea and chlamydia if:  You are sexually active and are younger than 58 years of age.  You are older than 58 years of age and your health care provider tells you that you are at risk for this type of infection.  Your sexual activity has changed since you were last screened and you are at an increased risk for chlamydia or gonorrhea. Ask your health care provider if you are at risk.  If you do not have HIV, but are at risk, it may be recommended that you take a prescription medicine daily to prevent HIV infection. This is called pre-exposure prophylaxis (PrEP). You are considered at risk if:  You are sexually active and do not regularly use condoms or  know the HIV status of your partner(s).  You take drugs by injection.  You are sexually active with a partner who has HIV. Talk with your health care provider about whether you are at high risk of being infected with HIV. If you choose to begin PrEP, you should first be tested for HIV. You  should then be tested every 3 months for as long as you are taking PrEP.  PREGNANCY   If you are premenopausal and you may become pregnant, ask your health care provider about preconception counseling.  If you may become pregnant, take 400 to 800 micrograms (mcg) of folic acid every day.  If you want to prevent pregnancy, talk to your health care provider about birth control (contraception). OSTEOPOROSIS AND MENOPAUSE   Osteoporosis is a disease in which the bones lose minerals and strength with aging. This can result in serious bone fractures. Your risk for osteoporosis can be identified using a bone density scan.  If you are 73 years of age or older, or if you are at risk for osteoporosis and fractures, ask your health care provider if you should be screened.  Ask your health care provider whether you should take a calcium or vitamin D supplement to lower your risk for osteoporosis.  Menopause may have certain physical symptoms and risks.  Hormone replacement therapy may reduce some of these symptoms and risks. Talk to your health care provider about whether hormone replacement therapy is right for you.  HOME CARE INSTRUCTIONS   Schedule regular health, dental, and eye exams.  Stay current with your immunizations.   Do not use any tobacco products including cigarettes, chewing tobacco, or electronic cigarettes.  If you are pregnant, do not drink alcohol.  If you are breastfeeding, limit how much and how often you drink alcohol.  Limit alcohol intake to no more than 1 drink per day for nonpregnant women. One drink equals 12 ounces of beer, 5 ounces of wine, or 1 ounces of hard liquor.  Do  not use street drugs.  Do not share needles.  Ask your health care provider for help if you need support or information about quitting drugs.  Tell your health care provider if you often feel depressed.  Tell your health care provider if you have ever been abused or do not feel safe at home. Document Released: 09/30/2010 Document Revised: 08/01/2013 Document Reviewed: 02/16/2013 [facility] Patient Information 2015 [place], [place]. This information is not intended to replace advice given to you by your health care provider. Make sure you discuss any questions you have with your health care provider.

## 2014-09-19 NOTE — Progress Notes (Signed)
Subjective:    Patient ID: Megan Hooper, female    DOB: [birth year], 59 y.o.   MRN: [id number]  Chief Complaint  Patient presents with  . Annual Exam   Patient Active Problem List   Diagnosis Date Noted  . Hyperlipidemia 09/19/2014  . Hot flashes 09/19/2014   Prior to Admission medications   Medication Sig Start Date End Date Taking? Authorizing Provider  estradiol (ESTRACE) 2 MG tablet TAKE 1 TABLET ONCE DAILY. 09/19/14  Yes Evone Arseneau, PA  oxybutynin (DITROPAN-XL) 5 MG 24 hr tablet Take 1 tablet (5 mg total) by mouth daily. 09/19/14  Yes Araceli Bouche, PA   Medications, allergies, past medical history, surgical history, family history, social history and problem list reviewed and updated.  HPI  52 yof presents for cpe.   We last saw her for cpe 9/15. Today she states doing well since then. No new issues or complaints. 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 She has two 62 yo twin daughters who live with her at home and keep her very busy.   Labs drawn 8/15 --> normal cmp, neg fobt, cbc with mild leukopenia, high ldl on lipids, ua with rbcs and leukocytes but urine culture was neg.  Exercise: Walking 3 miles day 5 days week.  Diet: None specific. Rare meat intake as one daughter is vegetarian.   Preventative:  Mammo: utd Bds: hx osteopenia, last bds 2014, repeat due Colonoscopy: pt has declined past several yrs, today she states she actually had one 5 yrs ago with Dr Collene Mares and was normal. Neg fobt last year. Denies constipation, blood in stool, painful BMs.  Tdap is utd.   Meds: Estradiol: Takes 2 mg qd for hot flashes. Has done this for 3-4 yrs.  Oxybutynin: For overactive bladder.   Review of Systems Negative per ROS sheet.     Objective:   Physical Exam  Constitutional: She is oriented to person, place, and time. She appears well-developed and well-nourished.  Non-toxic appearance. She does not have a sickly appearance. She does not appear ill. No  distress.  BP 114/77 mmHg  Pulse 75  Temp(Src) 97.8 F (36.6 C)  Resp 16  Ht 5' 4.5" (1.638 m)  Wt 150 lb (68.04 kg)  BMI 25.36 kg/m2  SpO2 98%   HENT:  Right Ear: Tympanic membrane normal.  Left Ear: Tympanic membrane normal.  Mouth/Throat: Uvula is midline, oropharynx is clear and moist and mucous membranes are normal.  Eyes: Conjunctivae and EOM are normal. Pupils are equal, round, and reactive to light.  Neck: Trachea normal and normal range of motion. Carotid bruit is not present. No thyroid mass and no thyromegaly present.  Cardiovascular: Normal rate, regular rhythm and normal heart sounds.   Pulses:      Posterior tibial pulses are 2+ on the right side, and 2+ on the left side.  Pulmonary/Chest: Effort normal and breath sounds normal. No tachypnea.  Abdominal: Soft. Normal appearance and bowel sounds are normal. There is no tenderness. There is no CVA tenderness.  Neurological: She is alert and oriented to person, place, and time. She has normal strength. No cranial nerve deficit or sensory deficit. She displays a negative Romberg sign.  Psychiatric: She has a normal mood and affect. Her speech is normal and behavior is normal.       Assessment & Plan:   59 yof presents for cpe.   Annual physical exam --normal exam, vitals --rtc one year, sooner with issues  Screening for nephropathy - Plan: Comprehensive metabolic panel  H/O hematuria - Plan: POCT urinalysis dipstick, POCT UA - Microscopic Only  Leukopenia - Plan: CBC  Special screening for malignant neoplasms, colon - Plan: POC Hemoccult Bld/Stl (3-Cd Home Screen)  Hyperlipidemia - Plan: Lipid panel  Hot flashes - Plan: estradiol (ESTRACE) 2 MG tablet --discussed with pt starting to think about titrating down her dose and eventually off --has been on 3-4 yrs, discussed plan to try to dc estradiol by age 10, she is thinking about slowly decreasing her dose to 1 mg qd over the course of the summer  Osteopenia  - Plan: DG Bone Density  Julieta Gutting, PA-C Physician Assistant-Certified [facility]  09/20/2014 9:27 AM

## 2014-09-20 ENCOUNTER — Telehealth: Payer: Self-pay | Admitting: Physician Assistant

## 2014-09-20 NOTE — Telephone Encounter (Signed)
Labs reviewed from 09/19/14. LDL cholesterol still elevated. 10 year risk only 1.8% per acc/aha calculator. Will hold off on statin. UA showed no hematuria but continued leukocytes in urine. Leuks have decreased from moderate 9 months ago to mild today. Since she had a negative urine culture 9 months ago despite moderate leuks will pursue no further w/u at this time.

## 2014-09-21 ENCOUNTER — Telehealth: Payer: Self-pay | Admitting: Physician Assistant

## 2014-09-21 DIAGNOSIS — D72819 Decreased white blood cell count, unspecified: Secondary | ICD-10-CM

## 2014-09-21 LAB — POC HEMOCCULT BLD/STL (HOME/3-CARD/SCREEN): Card #1 Date: NEGATIVE

## 2014-09-21 LAB — PATHOLOGIST SMEAR REVIEW

## 2014-09-21 NOTE — Addendum Note (Signed)
Addended by: Yvette Rack on: 09/21/2014 11:46 AM   Modules accepted: Orders

## 2014-09-21 NOTE — Telephone Encounter (Signed)
Please let pt know we are referring her to hematology. Her white blood cell has been slowly dropping over the past couple years. This can be completely normal but we want her to see the specialists to ensure everything is ok.

## 2014-09-22 NOTE — Telephone Encounter (Signed)
Spoke with pt, advised message from [place], Pt understood.

## 2014-09-29 ENCOUNTER — Telehealth: Payer: Self-pay | Admitting: Hematology & Oncology

## 2014-09-29 NOTE — Telephone Encounter (Signed)
Lt mess regarding new pt appt.

## 2014-10-05 ENCOUNTER — Telehealth: Payer: Self-pay

## 2014-10-05 NOTE — Telephone Encounter (Signed)
Spoke with Kristeen Miss to confirm new pt appt

## 2014-10-23 ENCOUNTER — Telehealth: Payer: Self-pay

## 2014-10-23 NOTE — Telephone Encounter (Signed)
Confirmed new pt appt for 7.26.16 with [facility]

## 2014-10-24 ENCOUNTER — Other Ambulatory Visit (HOSPITAL_BASED_OUTPATIENT_CLINIC_OR_DEPARTMENT_OTHER): Payer: BC Managed Care – PPO

## 2014-10-24 ENCOUNTER — Ambulatory Visit (HOSPITAL_BASED_OUTPATIENT_CLINIC_OR_DEPARTMENT_OTHER): Payer: BC Managed Care – PPO | Admitting: Hematology & Oncology

## 2014-10-24 ENCOUNTER — Ambulatory Visit: Payer: BC Managed Care – PPO

## 2014-10-24 ENCOUNTER — Encounter: Payer: Self-pay | Admitting: Hematology & Oncology

## 2014-10-24 VITALS — BP 118/76 | HR 64 | Temp 97.6°F | Resp 14 | Ht 65.0 in | Wt 154.0 lb

## 2014-10-24 DIAGNOSIS — D72819 Decreased white blood cell count, unspecified: Secondary | ICD-10-CM

## 2014-10-24 LAB — CBC WITH DIFFERENTIAL (CANCER CENTER ONLY)
BASO#: 0 10*3/uL (ref 0.0–0.2)
BASO%: 0.3 % (ref 0.0–2.0)
EOS ABS: 0.3 10*3/uL (ref 0.0–0.5)
EOS%: 8.3 % — AB (ref 0.0–7.0)
HCT: 39.7 % (ref 34.8–46.6)
HGB: 13.2 g/dL (ref 11.6–15.9)
LYMPH#: 1.1 10*3/uL (ref 0.9–3.3)
LYMPH%: 32 % (ref 14.0–48.0)
MCH: 29.5 pg (ref 26.0–34.0)
MCHC: 33.2 g/dL (ref 32.0–36.0)
MCV: 89 fL (ref 81–101)
MONO#: 0.2 10*3/uL (ref 0.1–0.9)
MONO%: 4.5 % (ref 0.0–13.0)
NEUT#: 1.9 10*3/uL (ref 1.5–6.5)
NEUT%: 54.9 % (ref 39.6–80.0)
Platelets: 292 10*3/uL (ref 145–400)
RBC: 4.47 10*6/uL (ref 3.70–5.32)
RDW: 12.8 % (ref 11.1–15.7)
WBC: 3.4 10*3/uL — AB (ref 3.9–10.0)

## 2014-10-24 LAB — CHCC SATELLITE - SMEAR

## 2014-10-24 NOTE — Progress Notes (Signed)
Referral MD  Reason for Referral: Leukopenia  Chief Complaint  Patient presents with  . NEW PATIENT  : My white blood cells are low.  HPI: Megan Hooper is a very nice 59 year old white female. She actually is part [place]. She is originally from [place]. 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 She has felt a little bit more fatigued. Patient has 2 teenage daughters that are twins. They're certainly keep her busy.  She was seen by her family doctor. She was found to have a low white blood cell count.  Going back 2 years ago, she was found have a white blood cell count of 4.3. She had a hemoglobin 11.9.  10 months ago, a CBC was done which showed a white cell count 3.1. Hemoglobin 14.3. Platelet count 274.  Back in June, her white cell count was down at 2.8. Hemoglobin 12.9. Platelet count 293. She had a normal MCV.  Unfortunately, I white cell differential was not done.  She was felt to be in need of a hematology referral. As such, she was collate referred to [facility] for an evaluation.  She's had no rashes. She's had no joint issues. She's had no weight loss or weight gain. She's had no thyroid problems.  She had a hysterectomy about 15 years ago.  She is to smoke but stopped about 20 years ago. She is a former alcoholic. She has not had a drink now for 24 years. She does try to exercise. She does walk. There is she's had no change in bowel or bladder habits. She is up-to-date with her mammograms. She is a little behind on her colonoscopies. She has a colonoscopy but has not had a follow-up colonoscopy. She has some polyps but has not yet gone back to have another one.   Overall, her performance status is ECOG 0. Overall,          Past Medical History  Diagnosis Date  . Osteopenia   . Asthma   . Leukopenia     mild  . Dyslipidemia   . Allergy   :  Past Surgical History  Procedure Laterality Date  . Abdominal  hysterectomy    :   Current outpatient prescriptions:  .  estradiol (ESTRACE) 2 MG tablet, TAKE 1 TABLET ONCE DAILY., Disp: 30 tablet, Rfl: 11 .  oxybutynin (DITROPAN-XL) 5 MG 24 hr tablet, Take 1 tablet (5 mg total) by mouth daily., Disp: 90 tablet, Rfl: 3 .  [DISCONTINUED] DITROPAN XL 5 MG 24 hr tablet, TAKE 1 TABLET ONCE DAILY., Disp: 90 tablet, Rfl: PRN:  :  Allergies  Allergen Reactions  . Codeine   :  Family History  Problem Relation Age of Onset  . Hyperlipidemia Mother   . Cancer Father   :  History   Social History  . Marital Status: Divorced    Spouse Name: N/A  . Number of Children: N/A  . Years of Education: N/A   Occupational History  . teacher    Social History Main Topics  . Smoking status: Former Smoker -- 0.50 packs/day for 13 years    Types: Cigarettes    Start date: 05/26/1978    Quit date: 10/22/1991  . Smokeless tobacco: Never Used     Comment: quit  13 years ago  . Alcohol Use: No  . Drug Use: No  . Sexual Activity: No   Other Topics Concern  . Not on file   Social History  Narrative  :  Pertinent items are noted in HPI.  Exam: _0 @ Well developed and well-nourished white female in no obvious distress. Vital signs show temperature of 97.6. Pulse 64. Blood pressure 118/76. Weight is 154 pounds. Head and neck exam shows no ocular or oral lesions. There are no palpable cervical or supraclavicular lymph nodes. Lungs are clear bilaterally. Cardiac exam regular rate and rhythm with no murmurs, rubs or bruits. Abdomen is soft. She has good bowel sounds. There is no fluid wave. There is no palpable liver or spleen tip. Back exam shows no tenderness over the spine, ribs or hips. Extremities shows no clubbing, cyanosis or edema. Skin exam shows no rashes, ecchymoses or petechia. Neurological exam shows no focal neurological deficits.    Recent Labs  10/24/14 1108  WBC 3.4*  HGB 13.2  HCT 39.7  PLT 292   No results for input(s): NA, K,  CL, CO2, GLUCOSE, BUN, CREATININE, CALCIUM in the last 72 hours.  Blood smear review: normochromic and normocytic palpation of red blood cells. She has no nucleated red blood cells. I see no teardrop cells. She has no schistocytes. There is no spherocytes. I see no rouleau formation. White cells are slightly decreased in number. She has mature polys. I see no hypersegmented neutrophils. There is a few large lymphocytes. She has a couple large granular lymphocytes. There have been a couple eosinophils. I saw no blasts. I saw no immature myeloid cells. Platelets looked adequate in number.  athology: None     Assessment and Plan:  Ms. Darwish is a very nice 59 year old white female. She has mild leukopenia. Her blood smear is quite reassuring.  I'm not sure as to why she would have the leukopenia. It is possible that the fact that she is part [place] might be a reason. I think there probably are situations in which benign leukopenia can be seen with various ethnic groups and [place] could be one of those groups.  I did see some large lymphocytes. I suppose that large granular lymphocytic leukemia would be possible. Again, I see nothing on her exam that would suggest this. I don't think we have to send off flow cytometry.  I don't find anything on her exam that would suggest a rheumatologic condition. Systemic lupus, rheumatoid arthritis, scleroderma I suppose all could potentially lead to decreased white blood cells.  I'll see anything that would suggest a bone marrow disorder. Again, I don't see that she would need to have a bone marrow biopsy done.  For now, I think we can just follow her along. I think that we can get her back to see [place] in about 4 months would be reasonable.  I spent about 45 minutes with her. I went over her lab work. I explained to her my recommendations. She understands what is going on.    I don't see

## 2014-12-29 ENCOUNTER — Encounter: Payer: Self-pay | Admitting: Physician Assistant

## 2015-03-15 ENCOUNTER — Ambulatory Visit (INDEPENDENT_AMBULATORY_CARE_PROVIDER_SITE_OTHER): Payer: BC Managed Care – PPO | Admitting: Emergency Medicine

## 2015-03-15 VITALS — BP 120/90 | HR 91 | Temp 97.4°F | Resp 16 | Ht 66.0 in | Wt 156.8 lb

## 2015-03-15 DIAGNOSIS — J0140 Acute pansinusitis, unspecified: Secondary | ICD-10-CM | POA: Diagnosis not present

## 2015-03-15 DIAGNOSIS — J209 Acute bronchitis, unspecified: Secondary | ICD-10-CM | POA: Diagnosis not present

## 2015-03-15 MED ORDER — HYDROCOD POLST-CPM POLST ER 10-8 MG/5ML PO SUER: 5.0000 mL | Freq: Two times a day (BID) | ORAL | Status: DC

## 2015-03-15 MED ORDER — AMOXICILLIN-POT CLAVULANATE 875-125 MG PO TABS: 1.0000 | ORAL_TABLET | Freq: Two times a day (BID) | ORAL | Status: DC

## 2015-03-15 MED ORDER — PSEUDOEPHEDRINE-GUAIFENESIN ER 60-600 MG PO TB12: 1.0000 | ORAL_TABLET | Freq: Two times a day (BID) | ORAL | Status: DC

## 2015-03-15 NOTE — Patient Instructions (Signed)

## 2015-03-15 NOTE — Progress Notes (Signed)
Subjective:  Patient ID: Megan Hooper, female    DOB: [birth year]  Age: 59 y.o. MRN: [id number]  CC: Sore Throat   HPI Megan Hooper presents   Patient dry describes a history of nasal congestion and postnasal drainage. She has purulent nasal drainage and sore throat. She denies any fever chills. She has no wheezing or shortness of breath but does have a cough productive of mucopurulent sputum. She has no nausea vomiting or stool change. No rash. Been unable to control Megan Hooper symptoms with over-the-counter medication. She been taking Benadryl with no improvement she is I'm convinced that Megan Hooper feeling hot and cold is related to stopping Megan Hooper estradiol  History Megan Hooper has a past medical history of Osteopenia; Asthma; Leukopenia; Dyslipidemia; and Allergy.   She has past surgical history that includes Abdominal hysterectomy.   Megan Hooper  family history includes Cancer in Megan Hooper father; Hyperlipidemia in Megan Hooper mother.  She   reports that she quit smoking about 23 years ago. Megan Hooper smoking use included Cigarettes. She started smoking about 36 years ago. She has a 6.5 pack-year smoking history. She has never used smokeless tobacco. She reports that she does not drink alcohol or use illicit drugs.  Outpatient Prescriptions Prior to Visit  Medication Sig Dispense Refill  . estradiol (ESTRACE) 2 MG tablet TAKE 1 TABLET ONCE DAILY. 30 tablet 11  . oxybutynin (DITROPAN-XL) 5 MG 24 hr tablet Take 1 tablet (5 mg total) by mouth daily. 90 tablet 3   No facility-administered medications prior to visit.    Social History   Social History  . Marital Status: Divorced    Spouse Name: N/A  . Number of Children: N/A  . Years of Education: N/A   Occupational History  . teacher    Social History Main Topics  . Smoking status: Former Smoker -- 0.50 packs/day for 13 years    Types: Cigarettes    Start date: 05/26/1978    Quit date: 10/22/1991  . Smokeless tobacco: Never Used     Comment: quit  13 years ago    . Alcohol Use: No  . Drug Use: No  . Sexual Activity: No   Other Topics Concern  . None   Social History Narrative     Review of Systems  Constitutional: Negative for fever, chills and appetite change.  HENT: Positive for congestion, rhinorrhea and sore throat. Negative for ear pain, postnasal drip and sinus pressure.   Eyes: Negative for pain and redness.  Respiratory: Positive for cough and shortness of breath. Negative for wheezing.   Cardiovascular: Negative for leg swelling.  Gastrointestinal: Negative for nausea, vomiting, abdominal pain, diarrhea, constipation and blood in stool.  Endocrine: Negative for polyuria.  Genitourinary: Negative for dysuria, urgency, frequency and flank pain.  Musculoskeletal: Negative for gait problem.  Skin: Negative for rash.  Neurological: Negative for weakness and headaches.  Psychiatric/Behavioral: Negative for confusion and decreased concentration. The patient is not nervous/anxious.     Objective:  BP 120/90 mmHg  Pulse 91  Temp(Src) 97.4 F (36.3 C) (Oral)  Resp 16  Ht 5\' 6"  (1.676 m)  Wt 156 lb 12.8 oz (71.124 kg)  BMI 25.32 kg/m2  SpO2 97%  Physical Exam  Constitutional: She is oriented to person, place, and time. She appears well-developed and well-nourished. No distress.  HENT:  Head: Normocephalic and atraumatic.  Right Ear: External ear normal.  Left Ear: External ear normal.  Nose: Nose normal.  Eyes: Conjunctivae and EOM are normal. Pupils are equal,  round, and reactive to light. No scleral icterus.  Neck: Normal range of motion. Neck supple. No tracheal deviation present.  Cardiovascular: Normal rate, regular rhythm and normal heart sounds.   Pulmonary/Chest: Effort normal. No respiratory distress. She has no wheezes. She has no rales.  Abdominal: She exhibits no mass. There is no tenderness. There is no rebound and no guarding.  Musculoskeletal: She exhibits no edema.  Lymphadenopathy:    She has no cervical  adenopathy.  Neurological: She is alert and oriented to person, place, and time. Coordination normal.  Skin: Skin is warm and dry. No rash noted.  Psychiatric: She has a normal mood and affect. Megan Hooper behavior is normal.      Assessment & Plan:   Megan Hooper was seen today for sore throat.  Diagnoses and all orders for this visit:  Acute bronchitis, unspecified organism  Acute pansinusitis, recurrence not specified  Other orders -     pseudoephedrine-guaifenesin (MUCINEX D) 60-600 MG 12 hr tablet; Take 1 tablet by mouth every 12 (twelve) hours. -     chlorpheniramine-HYDROcodone (TUSSIONEX PENNKINETIC ER) 10-8 MG/5ML SUER; Take 5 mLs by mouth 2 (two) times daily. -     amoxicillin-clavulanate (AUGMENTIN) 875-125 MG tablet; Take 1 tablet by mouth 2 (two) times daily.  I am having Megan Hooper start on pseudoephedrine-guaifenesin, chlorpheniramine-HYDROcodone, and amoxicillin-clavulanate. I am also having Megan Hooper maintain Megan Hooper estradiol and oxybutynin.  Meds ordered this encounter  Medications  . pseudoephedrine-guaifenesin (MUCINEX D) 60-600 MG 12 hr tablet    Sig: Take 1 tablet by mouth every 12 (twelve) hours.    Dispense:  18 tablet    Refill:  0  . chlorpheniramine-HYDROcodone (TUSSIONEX PENNKINETIC ER) 10-8 MG/5ML SUER    Sig: Take 5 mLs by mouth 2 (two) times daily.    Dispense:  60 mL    Refill:  0  . amoxicillin-clavulanate (AUGMENTIN) 875-125 MG tablet    Sig: Take 1 tablet by mouth 2 (two) times daily.    Dispense:  20 tablet    Refill:  0    Appropriate red flag conditions were discussed with the patient as well as actions that should be taken.  Patient expressed his understanding.  Follow-up: Return if symptoms worsen or fail to improve.  Roselee Culver, MD

## 2015-03-16 ENCOUNTER — Telehealth: Payer: Self-pay

## 2015-03-16 ENCOUNTER — Other Ambulatory Visit: Payer: Self-pay | Admitting: Emergency Medicine

## 2015-03-16 MED ORDER — LEVOFLOXACIN 500 MG PO TABS: 500.0000 mg | ORAL_TABLET | Freq: Every day | ORAL | Status: AC

## 2015-03-16 NOTE — Telephone Encounter (Signed)
Pt advised.

## 2015-03-16 NOTE — Telephone Encounter (Signed)
Pt was seen on 12/15 by Dr. Ouida Sills for Acute bronchitis, unspecified organism - Primary. She feels the amoxicillin-clavulanate (AUGMENTIN) 875-125 MG tablet [id number] she was given is making her sick to her stomach. Whenever she takes this script, she will throw it up. She would like the liquid version of this. [facility] [employer] [facility]. CB # [phone]

## 2015-03-16 NOTE — Telephone Encounter (Signed)
Dr. Anderson, please advise.

## 2015-03-16 NOTE — Telephone Encounter (Signed)
Stop the antibiotic and start levaquin prescription sent

## 2015-03-27 ENCOUNTER — Ambulatory Visit: Payer: BC Managed Care – PPO | Admitting: Hematology & Oncology

## 2015-03-27 ENCOUNTER — Other Ambulatory Visit: Payer: BC Managed Care – PPO

## 2015-04-20 ENCOUNTER — Ambulatory Visit (INDEPENDENT_AMBULATORY_CARE_PROVIDER_SITE_OTHER): Payer: BC Managed Care – PPO | Admitting: Family Medicine

## 2015-04-20 VITALS — BP 124/80 | HR 95 | Temp 97.9°F | Resp 20 | Ht 66.0 in | Wt 159.0 lb

## 2015-04-20 DIAGNOSIS — R6884 Jaw pain: Secondary | ICD-10-CM

## 2015-04-20 DIAGNOSIS — R0789 Other chest pain: Secondary | ICD-10-CM

## 2015-04-20 NOTE — Progress Notes (Signed)
° °  Subjective:    Patient ID: Megan Hooper, female    DOB: [birth year], 60 y.o.   MRN: [id number] By signing my name below, I, Judithe Modest, attest that this documentation has been prepared under the direction and in the presence of Robyn Haber, MD. Electronically Signed: Judithe Modest, ER Scribe. 04/20/2015. 4:24 PM.  Chief Complaint  Patient presents with   Chest Pain    started last night   Flu Vaccine    HPI HPI Comments: Megan Hooper is a 60 y.o. female who presents to [facility] complaining of chest pain last night. Last night she was lying down watching TV and experienced chest pain that radiated to her jaw, with diaphoresis, SOB and nausea. She had no sx when she woke up this morning. She states she ate dinner at 11pm last night, and thought her sx might have been indigestion.    Past Medical History  Diagnosis Date   Osteopenia    Asthma    Leukopenia     mild   Dyslipidemia    Allergy    Allergies  Allergen Reactions   Codeine    Current Outpatient Prescriptions on File Prior to Visit  Medication Sig Dispense Refill   estradiol (ESTRACE) 2 MG tablet TAKE 1 TABLET ONCE DAILY. 30 tablet 11   oxybutynin (DITROPAN-XL) 5 MG 24 hr tablet Take 1 tablet (5 mg total) by mouth daily. 90 tablet 3   chlorpheniramine-HYDROcodone (TUSSIONEX PENNKINETIC ER) 10-8 MG/5ML SUER Take 5 mLs by mouth 2 (two) times daily. (Patient not taking: Reported on 04/20/2015) 60 mL 0   pseudoephedrine-guaifenesin (MUCINEX D) 60-600 MG 12 hr tablet Take 1 tablet by mouth every 12 (twelve) hours. (Patient not taking: Reported on 04/20/2015) 18 tablet 0   [DISCONTINUED] DITROPAN XL 5 MG 24 hr tablet TAKE 1 TABLET ONCE DAILY. 90 tablet PRN   No current facility-administered medications on file prior to visit.    Review of Systems  Constitutional: Positive for diaphoresis. Negative for fever and chills.  Respiratory: Positive for shortness of breath. Negative for chest tightness.    Cardiovascular: Positive for chest pain.  Gastrointestinal: Positive for nausea. Negative for vomiting.       Objective:  BP 124/80 mmHg   Pulse 95   Temp(Src) 97.9 F (36.6 C) (Oral)   Resp 20   Ht 5\' 6"  (1.676 m)   Wt 159 lb (72.122 kg)   BMI 25.68 kg/m2   SpO2 97%  Physical Exam  Constitutional: She is oriented to person, place, and time. She appears well-developed and well-nourished. No distress.  HENT:  Head: Normocephalic and atraumatic.  Eyes: Pupils are equal, round, and reactive to light.  Neck: Neck supple.  Cardiovascular: Normal rate.   Pulmonary/Chest: Effort normal. No respiratory distress.  Musculoskeletal: Normal range of motion.  Neurological: She is alert and oriented to person, place, and time. Coordination normal.  Skin: Skin is warm and dry. She is not diaphoretic.  Psychiatric: She has a normal mood and affect. Her behavior is normal.  Nursing note and vitals reviewed.    EKG:  NSR Assessment & Plan:   This chart was scribed in my presence and reviewed by me personally.    ICD-9-CM ICD-10-CM   1. Other chest pain 786.59 R07.89 EKG 12-Lead  2. Jaw pain 784.92 R68.84 EKG 12-Lead   Reflux gastroesophagitis  Signed, Robyn Haber, MD

## 2015-07-25 DIAGNOSIS — F4323 Adjustment disorder with mixed anxiety and depressed mood: Secondary | ICD-10-CM | POA: Diagnosis not present

## 2015-09-04 DIAGNOSIS — F4323 Adjustment disorder with mixed anxiety and depressed mood: Secondary | ICD-10-CM | POA: Diagnosis not present

## 2015-09-27 ENCOUNTER — Encounter: Payer: Self-pay | Admitting: Family Medicine

## 2015-09-27 ENCOUNTER — Ambulatory Visit (INDEPENDENT_AMBULATORY_CARE_PROVIDER_SITE_OTHER): Payer: BC Managed Care – PPO | Admitting: Family Medicine

## 2015-09-27 VITALS — BP 126/78 | HR 80 | Temp 97.8°F | Resp 16 | Ht 64.75 in | Wt 156.4 lb

## 2015-09-27 DIAGNOSIS — N3946 Mixed incontinence: Secondary | ICD-10-CM

## 2015-09-27 DIAGNOSIS — E785 Hyperlipidemia, unspecified: Secondary | ICD-10-CM

## 2015-09-27 DIAGNOSIS — Z1329 Encounter for screening for other suspected endocrine disorder: Secondary | ICD-10-CM | POA: Diagnosis not present

## 2015-09-27 DIAGNOSIS — Z114 Encounter for screening for human immunodeficiency virus [HIV]: Secondary | ICD-10-CM

## 2015-09-27 DIAGNOSIS — M8580 Other specified disorders of bone density and structure, unspecified site: Secondary | ICD-10-CM | POA: Diagnosis not present

## 2015-09-27 DIAGNOSIS — D72819 Decreased white blood cell count, unspecified: Secondary | ICD-10-CM

## 2015-09-27 DIAGNOSIS — R6889 Other general symptoms and signs: Secondary | ICD-10-CM

## 2015-09-27 DIAGNOSIS — Z0000 Encounter for general adult medical examination without abnormal findings: Secondary | ICD-10-CM | POA: Diagnosis not present

## 2015-09-27 DIAGNOSIS — Z1159 Encounter for screening for other viral diseases: Secondary | ICD-10-CM | POA: Diagnosis not present

## 2015-09-27 LAB — POC MICROSCOPIC URINALYSIS (UMFC): Mucus: ABSENT

## 2015-09-27 LAB — LIPID PANEL
CHOL/HDL RATIO: 3.5 ratio (ref <=5.0)
Cholesterol: 262 mg/dL — ABNORMAL HIGH (ref 125–200)
HDL: 75 mg/dL (ref >=46)
LDL CALC: 169 mg/dL — AB (ref <130)
Triglycerides: 91 mg/dL (ref <150)
VLDL: 18 mg/dL (ref <30)

## 2015-09-27 LAB — POCT URINALYSIS DIP (MANUAL ENTRY)
BILIRUBIN UA: NEGATIVE
Blood, UA: NEGATIVE
Glucose, UA: NEGATIVE
Ketones, POC UA: NEGATIVE
NITRITE UA: NEGATIVE
PH UA: 5
PROTEIN UA: NEGATIVE
Spec Grav, UA: <=1.005
UROBILINOGEN UA: 0.2

## 2015-09-27 LAB — CBC WITH DIFFERENTIAL/PLATELET
BASOS ABS: 0 {cells}/uL (ref 0–200)
Basophils Relative: 0 %
Eosinophils Absolute: 217 cells/uL (ref 15–500)
Eosinophils Relative: 7 %
HEMATOCRIT: 36.2 % (ref 35.0–45.0)
HEMOGLOBIN: 11.8 g/dL (ref 11.7–15.5)
LYMPHS ABS: 1116 {cells}/uL (ref 850–3900)
Lymphocytes Relative: 36 %
MCH: 28.2 pg (ref 27.0–33.0)
MCHC: 32.6 g/dL (ref 32.0–36.0)
MCV: 86.6 fL (ref 80.0–100.0)
MONO ABS: 124 {cells}/uL — AB (ref 200–950)
MPV: 9.5 fL (ref 7.5–12.5)
Monocytes Relative: 4 %
NEUTROS PCT: 53 %
Neutro Abs: 1643 cells/uL (ref 1500–7800)
Platelets: 330 10*3/uL (ref 140–400)
RBC: 4.18 MIL/uL (ref 3.80–5.10)
RDW: 15.2 % — ABNORMAL HIGH (ref 11.0–15.0)
WBC: 3.1 10*3/uL — AB (ref 3.8–10.8)

## 2015-09-27 LAB — COMPLETE METABOLIC PANEL WITH GFR
ALBUMIN: 4 g/dL (ref 3.6–5.1)
ALK PHOS: 60 U/L (ref 33–130)
ALT: 17 U/L (ref 6–29)
AST: 16 U/L (ref 10–35)
BUN: 12 mg/dL (ref 7–25)
CO2: 23 mmol/L (ref 20–31)
Calcium: 9.2 mg/dL (ref 8.6–10.4)
Chloride: 105 mmol/L (ref 98–110)
Creat: 0.61 mg/dL (ref 0.50–1.05)
GFR, Est African American: >89 mL/min (ref >=60)
GLUCOSE: 98 mg/dL (ref 65–99)
Potassium: 4.2 mmol/L (ref 3.5–5.3)
SODIUM: 140 mmol/L (ref 135–146)
Total Bilirubin: 0.4 mg/dL (ref 0.2–1.2)
Total Protein: 6.5 g/dL (ref 6.1–8.1)

## 2015-09-27 LAB — TSH: TSH: 2.8 m[IU]/L

## 2015-09-27 MED ORDER — OXYBUTYNIN CHLORIDE ER 10 MG PO TB24: 10.0000 mg | ORAL_TABLET | Freq: Every day | ORAL | Status: DC
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
## 2015-09-27 NOTE — Patient Instructions (Addendum)
IF you received an x-ray today, you will receive an invoice from [facility]. Please contact [facility] at [phone] with questions or concerns regarding your invoice.   IF you received labwork today, you will receive an invoice from [employer]. Please contact [facility] at [phone] with questions or concerns regarding your invoice.   Our billing staff will not be able to assist you with questions regarding bills from these companies.  You will be contacted with the lab results as soon as they are available. The fastest way to get your results is to activate your My Chart account. Instructions are located on the last page of this paperwork. If you have not heard from [place] regarding the results in 2 weeks, please contact this office.    Call Dr. Lorie Apley office to see when your next colonoscopy is due.  Increase ditropan to 10mg  each day, and kegel exercises. If not improved, would recommend urology eval - I will refer you.  Call to schedule your bone density test and mammogram.  Calcium 1200-1500mg  per day and 800iu of vitamin D per day. If your blood count is low again, would recommend following back up with Dr. Marin Olp.   Urinary Incontinence Urinary incontinence is the involuntary loss of urine from your bladder. CAUSES  There are many causes of urinary incontinence. They include:  Medicines.  Infections.  Prostatic enlargement, leading to overflow of urine from your bladder.  Surgery.  Neurological diseases.  Emotional factors. SIGNS AND SYMPTOMS Urinary Incontinence can be divided into four types: 1. Urge incontinence. Urge incontinence is the involuntary loss of urine before you have the opportunity to go to the bathroom. There is a sudden urge to void but not enough time to reach a bathroom. 2. Stress incontinence. Stress incontinence is the sudden loss of urine with any activity that forces urine to pass. It is commonly  caused by anatomical changes to the pelvis and sphincter areas of your body. 3. Overflow incontinence. Overflow incontinence is the loss of urine from an obstructed opening to your bladder. This results in a backup of urine and a resultant buildup of pressure within the bladder. When the pressure within the bladder exceeds the closing pressure of the sphincter, the urine overflows, which causes incontinence, similar to water overflowing a dam. 4. Total incontinence. Total incontinence is the loss of urine as a result of the inability to store urine within your bladder. DIAGNOSIS  Evaluating the cause of incontinence may require:  A thorough and complete medical and obstetric history.  A complete physical exam.  Laboratory tests such as a urine culture and sensitivities. When additional tests are indicated, they can include:  An ultrasound exam.  Kidney and bladder X-rays.  Cystoscopy. This is an exam of the bladder using a narrow scope.  Urodynamic testing to test the nerve function to the bladder and sphincter areas. TREATMENT  Treatment for urinary incontinence depends on the cause:  For urge incontinence caused by a bacterial infection, antibiotics will be prescribed. If the urge incontinence is related to medicines you take, your health care provider may have you change the medicine.  For stress incontinence, surgery to re-establish anatomical support to the bladder or sphincter, or both, will often correct the condition.  For overflow incontinence caused by an enlarged prostate, an operation to open the channel through the enlarged prostate will allow the flow of urine out of the bladder. In women with fibroids, a hysterectomy may be recommended.  For total incontinence, surgery on your urinary sphincter may help. An artificial urinary sphincter (an inflatable cuff placed around the urethra) may be required. In women who have developed a hole-like passage between their bladder and  vagina (vesicovaginal fistula), surgery to close the fistula often is required. HOME CARE INSTRUCTIONS  Normal daily hygiene and the use of pads or adult diapers that are changed regularly will help prevent odors and skin damage.  Avoid caffeine. It can overstimulate your bladder.  Use the bathroom regularly. Try about every 2-3 hours to go to the bathroom, even if you do not feel the need to do so. Take time to empty your bladder completely. After urinating, wait a minute. Then try to urinate again.  For causes involving nerve dysfunction, keep a log of the medicines you take and a journal of the times you go to the bathroom. SEEK MEDICAL CARE IF:  You experience worsening of pain instead of improvement in pain after your procedure.  Your incontinence becomes worse instead of better. SEE IMMEDIATE MEDICAL CARE IF:  You experience fever or shaking chills.  You are unable to pass your urine.  You have redness spreading into your groin or down into your thighs. MAKE SURE YOU:   Understand these instructions.   Will watch your condition.  Will get help right away if you are not doing well or get worse.   This information is not intended to replace advice given to you by your health care provider. Make sure you discuss any questions you have with your health care provider.   Document Released: 04/24/2004 Document Revised: 04/07/2014 Document Reviewed: 08/24/2012 Elsevier Interactive Patient Education 2016 [place] [employer]  Get These Tests  Blood Pressure- Have your blood pressure checked by your healthcare provider at least once a year.  Normal blood pressure is 120/80.  Weight- Have your body mass index (BMI) calculated to screen for obesity.  BMI is a measure of body fat based on height and weight.  You can calculate your own BMI at [employer].it  Cholesterol- Have your cholesterol checked every year.  Diabetes- Have your blood sugar  checked every year if you have high blood pressure, high cholesterol, a family history of diabetes or if you are overweight.  Pap Test - Have a pap test every 1 to 5 years if you have been sexually active.  If you are older than 65 and recent pap tests have been normal you may not need additional pap tests.  In addition, if you have had a hysterectomy  for benign disease additional pap tests are not necessary.  Mammogram-Yearly mammograms are essential for early detection of breast cancer  Screening for Colon Cancer- Colonoscopy starting at age 10. Screening may begin sooner depending on your family history and other health conditions.  Follow up colonoscopy as directed by your Gastroenterologist.  Screening for Osteoporosis- Screening begins at age 70 with bone density scanning, sooner if you are at higher risk for developing Osteoporosis.  Get these medicines  Calcium with Vitamin D- Your body requires 1200-1500 mg of Calcium a day and [phone] IU of Vitamin D a day.  You can only absorb 500 mg of Calcium at a time therefore Calcium must be taken in 2 or 3 separate doses throughout the day.  Hormones- Hormone therapy has been associated with increased risk for certain cancers and heart disease.  Talk to your healthcare provider about if you need relief from menopausal symptoms.  Aspirin- Ask  your healthcare provider about taking Aspirin to prevent Heart Disease and Stroke.  Get these Immuniztions  Flu shot- Every fall  Pneumonia shot- Once after the age of 85; if you are younger ask your healthcare provider if you need a pneumonia shot.  Tetanus- Every ten years.  Zostavax- Once after the age of 72 to prevent shingles.  Take these steps  Don't smoke- Your healthcare provider can help you quit. For tips on how to quit, ask your healthcare provider or go to www.smokefree.gov or call 1-800 QUIT-NOW.  Be physically active- Exercise 5 days a week for a minimum of 30 minutes.  If you are  not already physically active, start slow and gradually work up to 30 minutes of moderate physical activity.  Try walking, dancing, bike riding, swimming, etc.  Eat a healthy diet- Eat a variety of healthy foods such as fruits, vegetables, whole grains, low fat milk, low fat cheeses, yogurt, lean meats, chicken, fish, eggs, dried beans, tofu, etc.  For more information go to www.thenutritionsource.org  Dental visit- Brush and floss teeth twice daily; visit your dentist twice a year.  Eye exam- Visit your Optometrist or Ophthalmologist yearly.  Drink alcohol in moderation- Limit alcohol intake to one drink or less a day.  Never drink and drive.  Depression- Your emotional health is as important as your physical health.  If you're feeling down or losing interest in things you normally enjoy, please talk to your healthcare provider.  Seat Belts- can save your life; always wear one  Smoke/Carbon Monoxide detectors- These detectors need to be installed on the appropriate level of your home.  Replace batteries at least once a year.  Violence- If anyone is threatening or hurting you, please tell your healthcare provider.  Living Will/ Health care power of attorney- Discuss with your healthcare provider and family.

## 2015-09-28 LAB — HEPATITIS C ANTIBODY: HCV Ab: NEGATIVE

## 2015-09-28 LAB — HIV ANTIBODY (ROUTINE TESTING W REFLEX): HIV: NONREACTIVE

## 2015-10-15 DIAGNOSIS — F4323 Adjustment disorder with mixed anxiety and depressed mood: Secondary | ICD-10-CM | POA: Diagnosis not present

## 2015-10-31 DIAGNOSIS — F4323 Adjustment disorder with mixed anxiety and depressed mood: Secondary | ICD-10-CM | POA: Diagnosis not present

## 2015-12-11 DIAGNOSIS — Z1231 Encounter for screening mammogram for malignant neoplasm of breast: Secondary | ICD-10-CM | POA: Diagnosis not present

## 2015-12-11 LAB — HM MAMMOGRAPHY: HM Mammogram: NORMAL (ref 0–4)

## 2015-12-18 ENCOUNTER — Ambulatory Visit (INDEPENDENT_AMBULATORY_CARE_PROVIDER_SITE_OTHER): Payer: BC Managed Care – PPO | Admitting: Family Medicine

## 2015-12-18 VITALS — BP 120/80 | HR 84 | Temp 97.9°F | Resp 16 | Ht 64.75 in | Wt 157.0 lb

## 2015-12-18 DIAGNOSIS — J3081 Allergic rhinitis due to animal (cat) (dog) hair and dander: Secondary | ICD-10-CM | POA: Diagnosis not present

## 2015-12-18 DIAGNOSIS — J4520 Mild intermittent asthma, uncomplicated: Secondary | ICD-10-CM | POA: Diagnosis not present

## 2015-12-18 DIAGNOSIS — Z23 Encounter for immunization: Secondary | ICD-10-CM | POA: Diagnosis not present

## 2015-12-18 MED ORDER — BECLOMETHASONE DIPROPIONATE 80 MCG/ACT IN AERS: 1.0000 | INHALATION_SPRAY | Freq: Two times a day (BID) | RESPIRATORY_TRACT | 2 refills | Status: DC

## 2015-12-18 MED ORDER — FLUTICASONE PROPIONATE 50 MCG/ACT NA SUSP: 2.0000 | Freq: Every day | NASAL | 6 refills | Status: DC

## 2015-12-18 MED ORDER — ALBUTEROL SULFATE HFA 108 (90 BASE) MCG/ACT IN AERS: 1.0000 | INHALATION_SPRAY | RESPIRATORY_TRACT | 0 refills | Status: DC | PRN

## 2015-12-18 NOTE — Patient Instructions (Addendum)
I will refer you to an allergist, but can try Zyrtec 10 mg once per day, Flonase nasal spray 1-2 sprays per nostril once per day, and for asthma, Qvar 1 puff twice per day. If you have breakthrough asthma symptoms with wheezing, he can use the albuterol red inhaler up to every 4 hours. However if albuterol needed every day, or more than once per day, return to discuss other options. Try to avoid the cats as much as possible.  If you're finding that you have no wheezing or asthma symptoms, can try coming off of the Qvar in 1 month, but then if wheezing noted - restart that medicine.   Return to the clinic or go to the nearest emergency room if any of your symptoms worsen or new symptoms occur.  Follow-up within the next 3 months to determine control of your symptoms. Sooner if worse.   Allergies An allergy is an abnormal reaction to a substance by the body's defense system (immune system). Allergies can develop at any age. WHAT CAUSES ALLERGIES? An allergic reaction happens when the immune system mistakenly reacts to a normally harmless substance, called an allergen, as if it were harmful. The immune system releases antibodies to fight the substance. Antibodies eventually release a chemical called histamine into the bloodstream. The release of histamine is meant to protect the body from infection, but it also causes discomfort. An allergic reaction can be triggered by:  Eating an allergen.  Inhaling an allergen.  Touching an allergen. WHAT TYPES OF ALLERGIES ARE THERE? There are many types of allergies. Common types include:  Seasonal allergies. People with this type of allergy are usually allergic to substances that are only present during certain seasons, such as molds and pollens.  Food allergies.  Drug allergies.  Insect allergies.  Animal dander allergies. WHAT ARE SYMPTOMS OF ALLERGIES? Possible allergy symptoms include:  Swelling of the lips, face, tongue, mouth, or  throat.  Sneezing, coughing, or wheezing.  Nasal congestion.  Tingling in the mouth.  Rash.  Itching.  Itchy, red, swollen areas of skin (hives).  Watery eyes.  Vomiting.  Diarrhea.  Dizziness.  Lightheadedness.  Fainting.  Trouble breathing or swallowing.  Chest tightness.  Rapid heartbeat. HOW ARE ALLERGIES DIAGNOSED? Allergies are diagnosed with a medical and family history and one or more of the following:  Skin tests.  Blood tests.  A food diary. A food diary is a record of all the foods and drinks you have in a day and of all the symptoms you experience.  The results of an elimination diet. An elimination diet involves eliminating foods from your diet and then adding them back in one by one to find out if a certain food causes an allergic reaction. HOW ARE ALLERGIES TREATED? There is no cure for allergies, but allergic reactions can be treated with medicine. Severe reactions usually need to be treated at a hospital. HOW CAN REACTIONS BE PREVENTED? The best way to prevent an allergic reaction is by avoiding the substance you are allergic to. Allergy shots and medicines can also help prevent reactions in some cases. People with severe allergic reactions may be able to prevent a life-threatening reaction called anaphylaxis with a medicine given right after exposure to the allergen.   This information is not intended to replace advice given to you by your health care provider. Make sure you discuss any questions you have with your health care provider.   Document Released: 06/10/2002 Document Revised: 04/07/2014 Document Reviewed: 12/27/2013 Elsevier  Interactive Patient Education [employer].    IF you received an x-ray today, you will receive an invoice from [facility]. Please contact [facility] at [phone] with questions or concerns regarding your invoice.   IF you received labwork today, you will receive an invoice from  [employer]. Please contact [facility] at [phone] with questions or concerns regarding your invoice.   Our billing staff will not be able to assist you with questions regarding bills from these companies.  You will be contacted with the lab results as soon as they are available. The fastest way to get your results is to activate your My Chart account. Instructions are located on the last page of this paperwork. If you have not heard from [place] regarding the results in 2 weeks, please contact this office.

## 2015-12-18 NOTE — Progress Notes (Signed)
. By signing my name below I, Tereasa Coop, attest that this documentation has been prepared under the direction and in the presence of [name], MD. Electonically Signed. Tereasa Coop, Scribe 12/18/2015 at 6:16 PM  Subjective:    Patient ID: Megan Hooper, female    DOB: November 16, 1955, 60 y.o.   MRN: [id number]  Chief Complaint  Patient presents with  . 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    HPI Megan Hooper is a 60 y.o. 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 Pt states that she spent 5 hrs with the cats within the past week and needed to use her daughters albuterol inhaler. Pt took zyrtec and flonase an hour prior to going to the house and she started to have wheezing at the end of the 5 hrs. Pt was hoping to start allergy shots to control symptoms.   Pt has history of asthma. Pt states that the only time she ever needs to use an albuterol inhaler whenever she is around cats.   Last seen in June 2017 for physical.     Patient Active Problem List   Diagnosis Date Noted  . Hyperlipidemia 09/19/2014  . Hot flashes 09/19/2014   Past Medical History:  Diagnosis Date  . Allergy   . Asthma   . Dyslipidemia   . Leukopenia    mild  . Osteopenia    Past Surgical History:  Procedure Laterality Date  . ABDOMINAL HYSTERECTOMY     Allergies  Allergen Reactions  . Codeine    Prior to Admission medications   Medication Sig Start Date End Date Taking? Authorizing Provider  oxybutynin (DITROPAN-XL) 10 MG 24 hr tablet Take 1 tablet (10 mg total) by mouth daily. 09/27/15  Yes [name], MD   Social History   Social History  . Marital status: Divorced    Spouse name: N/A  . Number of children: N/A  . Years of education: N/A   Occupational History  . teacher    Social History Main Topics  .  Smoking status: Former Smoker    Packs/day: 0.50    Years: 13.00    Types: Cigarettes    Start date: 05/26/1978    Quit date: 10/22/1991  . Smokeless tobacco: Never Used     Comment: quit  13 years ago  . Alcohol use No  . Drug use: No  . Sexual activity: No   Other Topics Concern  . Not on file   Social History Narrative  . No narrative on file      Review of Systems  Constitutional: Negative for fever.  Respiratory: Positive for wheezing (resolved currently).   Gastrointestinal: Negative for nausea.  Neurological: Negative for headaches.       Objective:   Physical Exam  Constitutional: She is oriented to person, place, and time. She appears well-developed and well-nourished. No distress.  HENT:  Head: Normocephalic and atraumatic.  Right Ear: Hearing, tympanic membrane, external ear and ear canal normal.  Left Ear: Hearing, tympanic membrane, external ear and ear canal normal.  Nose: Nose normal.  Mouth/Throat: Oropharynx is clear and moist. No oropharyngeal exudate or posterior oropharyngeal erythema.  Eyes: Conjunctivae and EOM are normal. Pupils are equal, round, and reactive to light.  Cardiovascular: Normal rate, regular rhythm,  normal heart sounds and intact distal pulses.  Exam reveals no gallop and no friction rub.   No murmur heard. Pulmonary/Chest: Effort normal and breath sounds normal. No accessory muscle usage. No respiratory distress. She has no decreased breath sounds. She has no wheezes. She has no rhonchi. She has no rales.  Neurological: She is alert and oriented to person, place, and time.  Skin: Skin is warm and dry. No rash noted.  Psychiatric: She has a normal mood and affect. Her behavior is normal.  Vitals reviewed.   Vitals:   12/18/15 1631  BP: 120/80  Pulse: 84  Resp: 16  Temp: 97.9 F (36.6 C)  TempSrc: Oral  SpO2: 98%  Weight: 157 lb (71.2 kg)  Height: 5' 4.75" (1.645 m)         Assessment & Plan:    Megan Hooper is a  60 y.o. female Flu vaccine need - Plan: Flu Vaccine QUAD 36+ mos IM given  Allergic rhinitis due to animal hair and dander - Plan: fluticasone (FLONASE) 50 MCG/ACT nasal spray  - History of cat pollen allergy. We'll be working in a home with 3 cats. Trigger avoidance discussed as much as possible, but due to continuous contact with cats, recommended starting Flonase every day, 0 Tevdek or Allegra once per day. Washing hands frequently if touching furniture or cats, and washing face and hair and she gets home. Will refer to allergist to discuss potential for allergy injections.  Asthma, mild intermittent, uncomplicated - Plan: Ambulatory referral to Allergy, albuterol (PROVENTIL HFA;VENTOLIN HFA) 108 (90 Base) MCG/ACT inhaler, beclomethasone (QVAR) 80 MCG/ACT inhaler  - Mild intermittent by history, but symptoms did flare with recent exposure to cats. We'll start inhaled steroid Qvar to begin with, but may need to adjust treatment depending on her symptoms. Albuterol prescribed for breakthrough symptoms and RTC precautions. Allergy referral as above.  Meds ordered this encounter  Medications  . fluticasone (FLONASE) 50 MCG/ACT nasal spray    Sig: Place 2 sprays into both nostrils daily.    Dispense:  16 g    Refill:  6  . albuterol (PROVENTIL HFA;VENTOLIN HFA) 108 (90 Base) MCG/ACT inhaler    Sig: Inhale 1-2 puffs into the lungs every 4 (four) hours as needed for wheezing or shortness of breath.    Dispense:  18 g    Refill:  0  . beclomethasone (QVAR) 80 MCG/ACT inhaler    Sig: Inhale 1 puff into the lungs 2 (two) times daily.    Dispense:  1 Inhaler    Refill:  2   Patient Instructions    I will refer you to an allergist, but can try Zyrtec 10 mg once per day, Flonase nasal spray 1-2 sprays per nostril once per day, and for asthma, Qvar 1 puff twice per day. If you have breakthrough asthma symptoms with wheezing, he can use the albuterol red inhaler up to every 4 hours. However if  albuterol needed every day, or more than once per day, return to discuss other options. Try to avoid the cats as much as possible.  If you're finding that you have no wheezing or asthma symptoms, can try coming off of the Qvar in 1 month, but then if wheezing noted - restart that medicine.   Return to the clinic or go to the nearest emergency room if any of your symptoms worsen or new symptoms occur.  Follow-up within the next 3 months to determine control of your symptoms. Sooner if worse.  Allergies An allergy is an abnormal reaction to a substance by the body's defense system (immune system). Allergies can develop at any age. WHAT CAUSES ALLERGIES? An allergic reaction happens when the immune system mistakenly reacts to a normally harmless substance, called an allergen, as if it were harmful. The immune system releases antibodies to fight the substance. Antibodies eventually release a chemical called histamine into the bloodstream. The release of histamine is meant to protect the body from infection, but it also causes discomfort. An allergic reaction can be triggered by:  Eating an allergen.  Inhaling an allergen.  Touching an allergen. WHAT TYPES OF ALLERGIES ARE THERE? There are many types of allergies. Common types include:  Seasonal allergies. People with this type of allergy are usually allergic to substances that are only present during certain seasons, such as molds and pollens.  Food allergies.  Drug allergies.  Insect allergies.  Animal dander allergies. WHAT ARE SYMPTOMS OF ALLERGIES? Possible allergy symptoms include:  Swelling of the lips, face, tongue, mouth, or throat.  Sneezing, coughing, or wheezing.  Nasal congestion.  Tingling in the mouth.  Rash.  Itching.  Itchy, red, swollen areas of skin (hives).  Watery eyes.  Vomiting.  Diarrhea.  Dizziness.  Lightheadedness.  Fainting.  Trouble breathing or swallowing.  Chest  tightness.  Rapid heartbeat. HOW ARE ALLERGIES DIAGNOSED? Allergies are diagnosed with a medical and family history and one or more of the following:  Skin tests.  Blood tests.  A food diary. A food diary is a record of all the foods and drinks you have in a day and of all the symptoms you experience.  The results of an elimination diet. An elimination diet involves eliminating foods from your diet and then adding them back in one by one to find out if a certain food causes an allergic reaction. HOW ARE ALLERGIES TREATED? There is no cure for allergies, but allergic reactions can be treated with medicine. Severe reactions usually need to be treated at a hospital. HOW CAN REACTIONS BE PREVENTED? The best way to prevent an allergic reaction is by avoiding the substance you are allergic to. Allergy shots and medicines can also help prevent reactions in some cases. People with severe allergic reactions may be able to prevent a life-threatening reaction called anaphylaxis with a medicine given right after exposure to the allergen.   This information is not intended to replace advice given to you by your health care provider. Make sure you discuss any questions you have with your health care provider.   Document Released: 06/10/2002 Document Revised: 04/07/2014 Document Reviewed: 12/27/2013 Elsevier Interactive Patient Education 2016 [employer].    IF you received an x-ray today, you will receive an invoice from [facility]. Please contact [facility] at [phone] with questions or concerns regarding your invoice.   IF you received labwork today, you will receive an invoice from [employer]. Please contact [facility] at [phone] with questions or concerns regarding your invoice.   Our billing staff will not be able to assist you with questions regarding bills from these companies.  You will be contacted with the lab results as soon as they  are available. The fastest way to get your results is to activate your My Chart account. Instructions are located on the last page of this paperwork. If you have not heard from [place] regarding the results in 2 weeks, please contact this office.       I personally performed the services described  in this documentation, which was scribed in my presence. The recorded information has been reviewed and considered, and addended by me as needed.   Signed,   [name], MD [facility].  12/19/15 10:54 PM

## 2015-12-19 DIAGNOSIS — J4520 Mild intermittent asthma, uncomplicated: Secondary | ICD-10-CM | POA: Insufficient documentation

## 2015-12-19 DIAGNOSIS — J3081 Allergic rhinitis due to animal (cat) (dog) hair and dander: Secondary | ICD-10-CM | POA: Insufficient documentation

## 2015-12-20 ENCOUNTER — Encounter: Payer: Self-pay | Admitting: Family Medicine

## 2015-12-20 DIAGNOSIS — M8589 Other specified disorders of bone density and structure, multiple sites: Secondary | ICD-10-CM | POA: Diagnosis not present

## 2015-12-20 DIAGNOSIS — M8580 Other specified disorders of bone density and structure, unspecified site: Secondary | ICD-10-CM | POA: Insufficient documentation

## 2016-01-18 ENCOUNTER — Telehealth: Payer: Self-pay | Admitting: Family Medicine

## 2016-01-18 ENCOUNTER — Encounter: Payer: Self-pay | Admitting: Family Medicine

## 2016-01-18 NOTE — Telephone Encounter (Signed)
Call patient. Bone density testing on September 22 indicated osteopenia but not osteoporosis. Make sure she is getting at least 1200-1500 mg of calcium per day, and 800 units of vitamin D per day. Plan to recheck levels in 2 years. Let me know if there are any questions. Density testing done at [facility] mammography, signed for scanning into chart.

## 2016-01-21 NOTE — Telephone Encounter (Signed)
Attempted to call pt, left VM for pt to call back  

## 2016-01-28 NOTE — Telephone Encounter (Signed)
LMVM to CB. 

## 2016-02-08 NOTE — Telephone Encounter (Signed)
LMOVM for pt to call back on dexa results and recommendations.

## 2016-02-11 NOTE — Telephone Encounter (Signed)
Patient advised.

## 2016-04-06 ENCOUNTER — Other Ambulatory Visit: Payer: Self-pay | Admitting: Family Medicine

## 2016-04-06 DIAGNOSIS — N3946 Mixed incontinence: Secondary | ICD-10-CM

## 2016-04-09 ENCOUNTER — Ambulatory Visit (INDEPENDENT_AMBULATORY_CARE_PROVIDER_SITE_OTHER): Payer: Self-pay | Admitting: Family Medicine

## 2016-04-09 ENCOUNTER — Encounter: Payer: Self-pay | Admitting: Family Medicine

## 2016-04-09 VITALS — BP 132/84 | HR 84 | Temp 97.5°F | Resp 16 | Ht 64.75 in | Wt 148.6 lb

## 2016-04-09 DIAGNOSIS — Z130 Encounter for screening for diseases of the blood and blood-forming organs and certain disorders involving the immune mechanism: Secondary | ICD-10-CM | POA: Diagnosis not present

## 2016-04-09 DIAGNOSIS — N3946 Mixed incontinence: Secondary | ICD-10-CM | POA: Diagnosis not present

## 2016-04-09 DIAGNOSIS — R209 Unspecified disturbances of skin sensation: Secondary | ICD-10-CM

## 2016-04-09 DIAGNOSIS — Z131 Encounter for screening for diabetes mellitus: Secondary | ICD-10-CM

## 2016-04-09 DIAGNOSIS — R6889 Other general symptoms and signs: Secondary | ICD-10-CM | POA: Diagnosis not present

## 2016-04-09 DIAGNOSIS — Z13220 Encounter for screening for lipoid disorders: Secondary | ICD-10-CM

## 2016-04-09 MED ORDER — OXYBUTYNIN CHLORIDE ER 10 MG PO TB24: 10.0000 mg | ORAL_TABLET | Freq: Every day | ORAL | 3 refills | Status: DC

## 2016-04-09 NOTE — Patient Instructions (Addendum)
You will be notified of your lab results.  You will be contacted regarding scheduling an appointment to see urology.  I have refilled your oxybutynin  For 1 year.    IF you received an x-ray today, you will receive an invoice from [facility]. Please contact [facility] at [phone] with questions or concerns regarding your invoice.   IF you received labwork today, you will receive an invoice from [place]. Please contact [facility] at [phone] with questions or concerns regarding your invoice.   Our billing staff will not be able to assist you with questions regarding bills from these companies.  You will be contacted with the lab results as soon as they are available. The fastest way to get your results is to activate your My Chart account. Instructions are located on the last page of this paperwork. If you have not heard from [place] regarding the results in 2 weeks, please contact this office.     Exercising to Stay Healthy Introduction Exercising regularly is important. It has many health benefits, such as:  Improving your overall fitness, flexibility, and endurance.  Increasing your bone density.  Helping with weight control.  Decreasing your body fat.  Increasing your muscle strength.  Reducing stress and tension.  Improving your overall health. In order to become healthy and stay healthy, it is recommended that you do moderate-intensity and vigorous-intensity exercise. You can tell that you are exercising at a moderate intensity if you have a higher heart rate and faster breathing, but you are still able to hold a conversation. You can tell that you are exercising at a vigorous intensity if you are breathing much harder and faster and cannot hold a conversation while exercising. How often should I exercise? Choose an activity that you enjoy and set realistic goals. Your health care provider can help you to make an activity plan that works for you.  Exercise regularly as directed by your health care provider. This may include:  Doing resistance training twice each week, such as:  Push-ups.  Sit-ups.  Lifting weights.  Using resistance bands.  Doing a given intensity of exercise for a given amount of time. Choose from these options:  150 minutes of moderate-intensity exercise every week.  75 minutes of vigorous-intensity exercise every week.  A mix of moderate-intensity and vigorous-intensity exercise every week. Children, pregnant women, people who are out of shape, people who are overweight, and older adults may need to consult a health care provider for individual recommendations. If you have any sort of medical condition, be sure to consult your health care provider before starting a new exercise program. What are some exercise ideas? Some moderate-intensity exercise ideas include:  Walking at a rate of 1 mile in 15 minutes.  Biking.  Hiking.  Golfing.  Dancing. Some vigorous-intensity exercise ideas include:  Walking at a rate of at least 4.5 miles per hour.  Jogging or running at a rate of 5 miles per hour.  Biking at a rate of at least 10 miles per hour.  Lap swimming.  Roller-skating or in-line skating.  Cross-country skiing.  Vigorous competitive sports, such as football, basketball, and soccer.  Jumping rope.  Aerobic dancing. What are some everyday activities that can help me to get exercise?  Yard work, such as:  Psychologist, educational.  Raking and bagging leaves.  Washing and waxing your car.  Pushing a stroller.  Shoveling snow.  Gardening.  Washing windows or floors. How can I be more active in  my day-to-day activities?  Use the stairs instead of the elevator.  Take a walk during your lunch break.  If you drive, park your car farther away from work or school.  If you take public transportation, get off one stop early and walk the rest of the way.  Make all of your phone  calls while standing up and walking around.  Get up, stretch, and walk around every 30 minutes throughout the day. What guidelines should I follow while exercising?  Do not exercise so much that you hurt yourself, feel dizzy, or get very short of breath.  Consult your health care provider before starting a new exercise program.  Wear comfortable clothes and shoes with good support.  Drink plenty of water while you exercise to prevent dehydration or heat stroke. Body water is lost during exercise and must be replaced.  Work out until you breathe faster and your heart beats faster. This information is not intended to replace advice given to you by your health care provider. Make sure you discuss any questions you have with your health care provider. Document Released: 04/19/2010 Document Revised: 08/23/2015 Document Reviewed: 08/18/2013  2017 Elsevier

## 2016-04-09 NOTE — Progress Notes (Signed)
Patient ID: Megan Hooper, female    DOB: 1955-04-16, 61 y.o.   MRN: [id number]  PCP: Jenny Reichmann, MD  Chief Complaint  Patient presents with  . Medication Refill    Subjective:  HPI 61 year old female presents for evaluation of medication refill. Past medical history includes, hx of smoking, allergy induced asthma, hyperlipemia, and mixed incontinence.  Mixed Incontinence  Taking Ditropan has solved urge incontinence although she would like to be evaluated for bladder sling surgery. She denies episodes of incontinence or frequency since dose was increased to 10 mg.  Cold Extremities  Reports progressively worsening cold extremities even with socks and lined boots. Within the last year, she has changed diet to meat once daily every other day. Uncertain if this is associated with condition. Denies any known or previous vascular disease.   Social History   Social History  . Marital status: Divorced    Spouse name: N/A  . Number of children: N/A  . Years of education: N/A   Occupational History  . teacher    Social History Main Topics  . Smoking status: Former Smoker    Packs/day: 0.50    Years: 13.00    Types: Cigarettes    Start date: 05/26/1978    Quit date: 10/22/1991  . Smokeless tobacco: Never Used     Comment: quit  13 years ago  . Alcohol use No  . Drug use: No  . Sexual activity: No   Other Topics Concern  . Not on file   Social History Narrative  . No narrative on file    Family History  Problem Relation Age of Onset  . Hyperlipidemia Mother   . Cancer Father    Review of Systems  See HPI  Patient Active Problem List   Diagnosis Date Noted  . Osteopenia 12/20/2015  . Allergy to cats 12/19/2015  . Asthma, mild intermittent 12/19/2015  . Hyperlipidemia 09/19/2014  . Hot flashes 09/19/2014    Allergies  Allergen Reactions  . Codeine     Prior to Admission medications   Medication Sig Start Date End Date Taking? Authorizing  Provider  albuterol (PROVENTIL HFA;VENTOLIN HFA) 108 (90 Base) MCG/ACT inhaler Inhale 1-2 puffs into the lungs every 4 (four) hours as needed for wheezing or shortness of breath. 12/18/15   [name], MD  beclomethasone (QVAR) 80 MCG/ACT inhaler Inhale 1 puff into the lungs 2 (two) times daily. 12/18/15   [name], MD  fluticasone (FLONASE) 50 MCG/ACT nasal spray Place 2 sprays into both nostrils daily. 12/18/15   [name], MD  oxybutynin (DITROPAN-XL) 10 MG 24 hr tablet Take 1 tablet (10 mg total) by mouth daily. 09/27/15   [name], MD    Past Medical, Surgical Family and Social History reviewed and updated.    Objective:  There were no vitals filed for this visit.  Wt Readings from Last 3 Encounters:  12/18/15 157 lb (71.2 kg)  09/27/15 156 lb 6.4 oz (70.9 kg)  04/20/15 159 lb (72.1 kg)    Physical Exam  Constitutional: She appears well-developed and well-nourished.  HENT:  Head: Normocephalic and atraumatic.  Eyes: Conjunctivae are normal. Pupils are equal, round, and reactive to light.  Neck: Normal range of motion. Neck supple.  Cardiovascular: Normal rate, regular rhythm, normal heart sounds and intact distal pulses.   Pulmonary/Chest: Effort normal and breath sounds normal.  Neurological: She is alert.  Skin: Skin is warm.  Extremities color symmetrical  with ethnic background. Skin is warm   Psychiatric: She has a normal mood and affect. Her behavior is normal. Judgment and thought content normal.       Assessment & Plan:   1. Mixed incontinence urge and stress, stable  - Ambulatory referral to Urology - Care order/instruction: - oxybutynin (DITROPAN-XL) 10 MG 24 hr tablet; Take 1 tablet (10 mg total) by mouth daily.  Dispense: 90 tablet; Refill: 3  2. Screening for diabetes mellitus - Comprehensive metabolic panel  3. Screening for deficiency anemia - CBC with Differential/Platelet  4. Screening cholesterol level - Lipid panel  5.  Cold extremities - Vitamin B12 - Vitamin D, 25-hydroxy   You will be notified of lab results.  Follow-up as needed.  Carroll Sage. Kenton Kingfisher, MSN, FNP-C [facility] [place]

## 2016-04-10 LAB — CBC WITH DIFFERENTIAL/PLATELET
BASOS: 1 %
Basophils Absolute: 0 10*3/uL (ref 0.0–0.2)
EOS (ABSOLUTE): 0.2 10*3/uL (ref 0.0–0.4)
EOS: 6 %
HEMATOCRIT: 39.3 % (ref 34.0–46.6)
HEMOGLOBIN: 12.8 g/dL (ref 11.1–15.9)
IMMATURE GRANULOCYTES: 0 %
Immature Grans (Abs): 0 10*3/uL (ref 0.0–0.1)
LYMPHS ABS: 1.3 10*3/uL (ref 0.7–3.1)
Lymphs: 39 %
MCH: 25.4 pg — ABNORMAL LOW (ref 26.6–33.0)
MCHC: 32.6 g/dL (ref 31.5–35.7)
MCV: 78 fL — AB (ref 79–97)
MONOCYTES: 6 %
MONOS ABS: 0.2 10*3/uL (ref 0.1–0.9)
Neutrophils Absolute: 1.6 10*3/uL (ref 1.4–7.0)
Neutrophils: 48 %
Platelets: 347 10*3/uL (ref 150–379)
RBC: 5.03 x10E6/uL (ref 3.77–5.28)
RDW: 15.3 % (ref 12.3–15.4)
WBC: 3.4 10*3/uL (ref 3.4–10.8)

## 2016-04-10 LAB — VITAMIN B12: VITAMIN B 12: 454 pg/mL (ref 232–1245)

## 2016-04-10 LAB — COMPREHENSIVE METABOLIC PANEL
ALBUMIN: 4.5 g/dL (ref 3.5–5.5)
ALK PHOS: 89 IU/L (ref 39–117)
ALT: 14 IU/L (ref 0–32)
AST: 13 IU/L (ref 0–40)
Albumin/Globulin Ratio: 1.6 (ref 1.2–2.2)
BUN / CREAT RATIO: 16 (ref 9–23)
BUN: 10 mg/dL (ref 6–24)
Bilirubin Total: 0.3 mg/dL (ref 0.0–1.2)
CALCIUM: 9.7 mg/dL (ref 8.7–10.2)
CO2: 25 mmol/L (ref 18–29)
CREATININE: 0.63 mg/dL (ref 0.57–1.00)
Chloride: 100 mmol/L (ref 96–106)
GFR, EST AFRICAN AMERICAN: 114 mL/min/{1.73_m2} (ref >59)
GFR, EST NON AFRICAN AMERICAN: 98 mL/min/{1.73_m2} (ref >59)
GLOBULIN, TOTAL: 2.8 g/dL (ref 1.5–4.5)
GLUCOSE: 92 mg/dL (ref 65–99)
Potassium: 4.2 mmol/L (ref 3.5–5.2)
SODIUM: 141 mmol/L (ref 134–144)
TOTAL PROTEIN: 7.3 g/dL (ref 6.0–8.5)

## 2016-04-10 LAB — LIPID PANEL
CHOL/HDL RATIO: 3.9 ratio (ref 0.0–4.4)
Cholesterol, Total: 278 mg/dL — ABNORMAL HIGH (ref 100–199)
HDL: 71 mg/dL (ref >39)
LDL CALC: 179 mg/dL — AB (ref 0–99)
TRIGLYCERIDES: 138 mg/dL (ref 0–149)
VLDL Cholesterol Cal: 28 mg/dL (ref 5–40)

## 2016-04-10 LAB — VITAMIN D 25 HYDROXY (VIT D DEFICIENCY, FRACTURES): Vit D, 25-Hydroxy: 19.2 ng/mL — ABNORMAL LOW (ref 30.0–100.0)

## 2016-04-10 MED ORDER — VITAMIN D (ERGOCALCIFEROL) 1.25 MG (50000 UNIT) PO CAPS: 50000.0000 [IU] | ORAL_CAPSULE | ORAL | 1 refills | Status: DC

## 2016-04-10 NOTE — Addendum Note (Signed)
Addended by: Scot Jun on: 04/10/2016 06:51 PM   Modules accepted: Orders

## 2016-05-20 ENCOUNTER — Telehealth: Payer: Self-pay

## 2016-05-20 NOTE — Telephone Encounter (Signed)
Pt was seen for bladder issues and was prescribed oxybutynin. Says she is now doubling up on the prescription amount but symptoms are getting worse. She would like advice on what to do next. She has an appointment with urologist in a few days.

## 2016-05-20 NOTE — Telephone Encounter (Signed)
Left message

## 2016-05-20 NOTE — Telephone Encounter (Signed)
I will send over a prescription for 30 mg of Ditropan. She should not exceed this dose as this is the maximum dose per day. Advise her to keep her upcoming appointment with urology for further evaluation for a bladder sling.  Carroll Sage. Kenton Kingfisher, MSN, FNP-C [facility] [place]

## 2016-05-23 DIAGNOSIS — R350 Frequency of micturition: Secondary | ICD-10-CM | POA: Diagnosis not present

## 2016-05-23 DIAGNOSIS — N3946 Mixed incontinence: Secondary | ICD-10-CM | POA: Diagnosis not present

## 2016-05-23 DIAGNOSIS — R8271 Bacteriuria: Secondary | ICD-10-CM | POA: Diagnosis not present

## 2016-05-23 DIAGNOSIS — R351 Nocturia: Secondary | ICD-10-CM | POA: Diagnosis not present

## 2016-06-03 ENCOUNTER — Telehealth: Payer: Self-pay | Admitting: Family Medicine

## 2016-06-03 DIAGNOSIS — Z1211 Encounter for screening for malignant neoplasm of colon: Secondary | ICD-10-CM

## 2016-06-03 NOTE — Telephone Encounter (Signed)
Pt calling wanting a referral for a colonoscopy please respond

## 2016-06-03 NOTE — Telephone Encounter (Signed)
Colonscopy ordered

## 2016-06-11 DIAGNOSIS — R351 Nocturia: Secondary | ICD-10-CM | POA: Diagnosis not present

## 2016-06-11 DIAGNOSIS — R350 Frequency of micturition: Secondary | ICD-10-CM | POA: Diagnosis not present

## 2016-06-11 DIAGNOSIS — N3946 Mixed incontinence: Secondary | ICD-10-CM | POA: Diagnosis not present

## 2016-07-09 ENCOUNTER — Encounter: Payer: Self-pay | Admitting: Family Medicine

## 2016-08-07 DIAGNOSIS — R350 Frequency of micturition: Secondary | ICD-10-CM | POA: Diagnosis not present

## 2016-08-07 DIAGNOSIS — N3946 Mixed incontinence: Secondary | ICD-10-CM | POA: Diagnosis not present

## 2016-09-12 DIAGNOSIS — N3946 Mixed incontinence: Secondary | ICD-10-CM | POA: Diagnosis not present

## 2016-09-12 DIAGNOSIS — R350 Frequency of micturition: Secondary | ICD-10-CM | POA: Diagnosis not present

## 2016-11-10 ENCOUNTER — Telehealth: Payer: Self-pay | Admitting: Physician Assistant

## 2016-11-10 DIAGNOSIS — Z1211 Encounter for screening for malignant neoplasm of colon: Secondary | ICD-10-CM

## 2016-11-10 NOTE — Telephone Encounter (Signed)
Pt called requesting cologuard test. She said she was referred to have a colonoscopy by Kimberly Harris but had not been able to make an appt because of her work schedule. She wanted to know if she could get a cologuard kit to do herself verses going for a colonoscopy. Please advise. Pt can be contacted at [phone]. Thanks.  °

## 2016-11-14 NOTE — Telephone Encounter (Signed)
LVM and advised pt to reestablish care and schedule and appointment.

## 2016-12-10 DIAGNOSIS — Z1231 Encounter for screening mammogram for malignant neoplasm of breast: Secondary | ICD-10-CM | POA: Diagnosis not present

## 2016-12-10 LAB — HM MAMMOGRAPHY

## 2016-12-16 ENCOUNTER — Ambulatory Visit (INDEPENDENT_AMBULATORY_CARE_PROVIDER_SITE_OTHER): Payer: BLUE CROSS/BLUE SHIELD | Admitting: Urgent Care

## 2016-12-16 DIAGNOSIS — Z23 Encounter for immunization: Secondary | ICD-10-CM | POA: Diagnosis not present

## 2016-12-19 ENCOUNTER — Ambulatory Visit (INDEPENDENT_AMBULATORY_CARE_PROVIDER_SITE_OTHER): Payer: BLUE CROSS/BLUE SHIELD | Admitting: Family Medicine

## 2016-12-19 ENCOUNTER — Encounter: Payer: Self-pay | Admitting: Family Medicine

## 2016-12-19 VITALS — BP 118/83 | HR 73 | Temp 97.8°F | Resp 18 | Ht 64.75 in | Wt 156.8 lb

## 2016-12-19 DIAGNOSIS — B351 Tinea unguium: Secondary | ICD-10-CM | POA: Diagnosis not present

## 2016-12-19 MED ORDER — TERBINAFINE HCL 250 MG PO TABS: 250.0000 mg | ORAL_TABLET | Freq: Every day | ORAL | 1 refills | Status: DC

## 2016-12-19 NOTE — Progress Notes (Addendum)
Subjective:  By signing my name below, I, [name], attest that this documentation has been prepared under the direction and in the presence of [name], MD. Electronically Signed: [name], [place]. 12/19/2016 , 9:22 AM .  Patient was seen in Room 12 .   Patient ID: Megan Hooper, female    DOB: Dec 24, 1955, 61 y.o.   MRN: [id number] Chief Complaint  Patient presents with  . Toe nail fungus   HPI Megan Hooper is a 61 y.o. female Here for toenail fungus. Patient states she's had thickened discolored toenails for several years. She's been trying applying tea tree oil and soaking her nails in the clear Listerine. She noticed a piece had broken off and gave in to be seen for it. She has put this off due to previous expensive cost of treatment.   She notes being sober for 26 years now.   Patient Active Problem List   Diagnosis Date Noted  . Osteopenia 12/20/2015  . Allergy to cats 12/19/2015  . Asthma, mild intermittent 12/19/2015  . Hyperlipidemia 09/19/2014  . Hot flashes 09/19/2014   Past Medical History:  Diagnosis Date  . Allergy   . Asthma   . Dyslipidemia   . Leukopenia    mild  . Osteopenia    Past Surgical History:  Procedure Laterality Date  . ABDOMINAL HYSTERECTOMY     Allergies  Allergen Reactions  . Codeine    Prior to Admission medications   Medication Sig Start Date End Date Taking? Authorizing Provider  albuterol (PROVENTIL HFA;VENTOLIN HFA) 108 (90 Base) MCG/ACT inhaler Inhale 1-2 puffs into the lungs every 4 (four) hours as needed for wheezing or shortness of breath. 12/18/15   [name], MD  beclomethasone (QVAR) 80 MCG/ACT inhaler Inhale 1 puff into the lungs 2 (two) times daily. Patient not taking: Reported on 04/09/2016 12/18/15   [name], MD  fluticasone [facility]) 50 MCG/ACT nasal spray Place 2 sprays into both nostrils daily. Patient not taking: Reported on 04/09/2016 12/18/15   [name], MD  oxybutynin  (DITROPAN-XL) 10 MG 24 hr tablet Take 1 tablet (10 mg total) by mouth daily. 04/09/16   Scot Jun, FNP  Vitamin D, Ergocalciferol, (DRISDOL) 50000 units CAPS capsule Take 1 capsule (50,000 Units total) by mouth every 7 (seven) days. 04/10/16   Scot Jun, FNP   Social History   Social History  . Marital status: Divorced    Spouse name: N/A  . Number of children: N/A  . Years of education: N/A   Occupational History  . teacher    Social History Main Topics  . Smoking status: Former Smoker    Packs/day: 0.50    Years: 13.00    Types: Cigarettes    Start date: 05/26/1978    Quit date: 10/22/1991  . Smokeless tobacco: Never Used     Comment: quit  13 years ago  . Alcohol use No  . Drug use: No  . Sexual activity: No   Other Topics Concern  . Not on file   Social History Narrative  . No narrative on file   Review of Systems  Constitutional: Negative for chills, fatigue, fever and unexpected weight change.  Respiratory: Negative for cough.   Gastrointestinal: Negative for constipation, diarrhea, nausea and vomiting.  Skin: Negative for rash and wound.       Toenail fungus  Neurological: Negative for dizziness, weakness and headaches.       Objective:  Physical Exam  Constitutional: She is oriented to person, place, and time. She appears well-developed and well-nourished. No distress.  HENT:  Head: Normocephalic and atraumatic.  Eyes: Pupils are equal, round, and reactive to light. EOM are normal.  Neck: Neck supple.  Cardiovascular: Normal rate.   Pulmonary/Chest: Effort normal. No respiratory distress.  Musculoskeletal: Normal range of motion.  Neurological: She is alert and oriented to person, place, and time.  Skin: Skin is warm and dry.  She has some thickened discolored toenails in her left 5th toenail and great toenail with some broken nail distal aspect of the distal great toenail; she also has some thickened discolored toenails in her right great  toenail and 5th toenail; no surrounding skin erythema and no discharge  Psychiatric: She has a normal mood and affect. Her behavior is normal.  Nursing note and vitals reviewed.   Vitals:   12/19/16 0857  BP: 118/83  Pulse: 73  Resp: 18  Temp: 97.8 F (36.6 C)  TempSrc: Oral  SpO2: 97%  Weight: 156 lb 12.8 oz (71.1 kg)  Height: 5' 4.75" (1.645 m)      Assessment & Plan:    Megan Hooper is a 61 y.o. female Onychomycosis - Plan: terbinafine (LAMISIL) 250 MG tablet, Hepatic Function Panel, Hepatic Function Panel  - Lack of relief to multiple over-the-counter treatments. Options discussed with topical versus oral medication. We'll start Lamisil 250 mg daily, check liver function testing, return for lab only visit for liver function tests in 6 weeks with office visit follow-up in 3 months to determine if further treatment needed.  Meds ordered this encounter           . terbinafine (LAMISIL) 250 MG tablet    Sig: Take 1 tablet (250 mg total) by mouth daily.    Dispense:  30 tablet    Refill:  1   Patient Instructions   Start Lamisil once per day. Recheck for lab only visit 6 weeks, then follow-up with me in 3 months to determine if other treatment as needed. If liver test is okay in 6 weeks, I will send in the additional 30 days of Lamisil.   Fungal Nail Infection Fungal nail infection is a common fungal infection of the toenails or fingernails. This condition affects toenails more often than fingernails. More than one nail may be infected. The condition can be passed from person to person (is contagious). What are the causes? This condition is caused by a fungus. Several types of funguses can cause the infection. These funguses are common in moist and warm areas. If your hands or feet come into contact with the fungus, it may get into a crack in your fingernail or toenail and cause the infection. What increases the risk? The following factors may make you more likely to  develop this condition:  Being female.  Having diabetes.  Being of older age.  Living with someone who has the fungus.  Walking barefoot in areas where the fungus thrives, such as showers or locker rooms.  Having poor circulation.  Wearing shoes and socks that cause your feet to sweat.  Having athlete's foot.  Having a nail injury or history of a recent nail surgery.  Having psoriasis.  Having a weak body defense system (immune system).  What are the signs or symptoms? Symptoms of this condition include:  A pale spot on the nail.  Thickening of the nail.  A nail that becomes yellow or brown.  A brittle or ragged  nail edge.  A crumbling nail.  A nail that has lifted away from the nail bed.  How is this diagnosed? This condition is diagnosed with a physical exam. Your health care provider may take a scraping or clipping from your nail to test for the fungus. How is this treated? Mild infections do not need treatment. If you have significant nail changes, treatment may include:  Oral antifungal medicines. You may need to take the medicine for several weeks or several months, and you may not see the results for a long time. These medicines can cause side effects. Ask your health care provider what problems to watch for.  Antifungal nail polish and nail cream. These may be used along with oral antifungal medicines.  Laser treatment of the nail.  Surgery to remove the nail. This may be needed for the most severe infections.  Treatment takes a long time, and the infection may come back. Follow these instructions at home: Medicines  Take or apply over-the-counter and prescription medicines only as told by your health care provider.  Ask your health care provider about using over-the-counter mentholated ointment on your nails. Lifestyle   Do not share personal items, such as towels or nail clippers.  Trim your nails often.  Wash and dry your hands and feet every  day.  Wear absorbent socks, and change your socks frequently.  Wear shoes that allow air to circulate, such as sandals or canvas tennis shoes. Throw out old shoes.  Wear rubber gloves if you are working with your hands in wet areas.  Do not walk barefoot in shower rooms or locker rooms.  Do not use a nail salon that does not use clean instruments.  Do not use artificial nails. General instructions  Keep all follow-up visits as told by your health care provider. This is important.  Use antifungal foot powder on your feet and in your shoes. Contact a health care provider if: Your infection is not getting better or it is getting worse after several months. This information is not intended to replace advice given to you by your health care provider. Make sure you discuss any questions you have with your health care provider. Document Released: 03/14/2000 Document Revised: 08/23/2015 Document Reviewed: 09/18/2014 Elsevier Interactive Patient Education  2018 [employer].    IF you received an x-ray today, you will receive an invoice from [facility]. Please contact [facility] at [phone] with questions or concerns regarding your invoice.   IF you received labwork today, you will receive an invoice from [place]. Please contact [facility] at [phone] with questions or concerns regarding your invoice.   Our billing staff will not be able to assist you with questions regarding bills from these companies.  You will be contacted with the lab results as soon as they are available. The fastest way to get your results is to activate your My Chart account. Instructions are located on the last page of this paperwork. If you have not heard from [place] regarding the results in 2 weeks, please contact this office.       I personally performed the services described in this documentation, which was scribed in my presence. The recorded information has been reviewed and  considered for accuracy and completeness, addended by me as needed, and agree with information above.  Signed,   [name], MD [facility] [place].  12/19/16 9:32 AM

## 2016-12-19 NOTE — Patient Instructions (Addendum)
Start Lamisil once per day. Recheck for lab only visit 6 weeks, then follow-up with me in 3 months to determine if other treatment as needed. If liver test is okay in 6 weeks, I will send in the additional 30 days of Lamisil.   Fungal Nail Infection Fungal nail infection is a common fungal infection of the toenails or fingernails. This condition affects toenails more often than fingernails. More than one nail may be infected. The condition can be passed from person to person (is contagious). What are the causes? This condition is caused by a fungus. Several types of funguses can cause the infection. These funguses are common in moist and warm areas. If your hands or feet come into contact with the fungus, it may get into a crack in your fingernail or toenail and cause the infection. What increases the risk? The following factors may make you more likely to develop this condition:  Being female.  Having diabetes.  Being of older age.  Living with someone who has the fungus.  Walking barefoot in areas where the fungus thrives, such as showers or locker rooms.  Having poor circulation.  Wearing shoes and socks that cause your feet to sweat.  Having athlete's foot.  Having a nail injury or history of a recent nail surgery.  Having psoriasis.  Having a weak body defense system (immune system).  What are the signs or symptoms? Symptoms of this condition include:  A pale spot on the nail.  Thickening of the nail.  A nail that becomes yellow or brown.  A brittle or ragged nail edge.  A crumbling nail.  A nail that has lifted away from the nail bed.  How is this diagnosed? This condition is diagnosed with a physical exam. Your health care provider may take a scraping or clipping from your nail to test for the fungus. How is this treated? Mild infections do not need treatment. If you have significant nail changes, treatment may include:  Oral antifungal medicines. You may need  to take the medicine for several weeks or several months, and you may not see the results for a long time. These medicines can cause side effects. Ask your health care provider what problems to watch for.  Antifungal nail polish and nail cream. These may be used along with oral antifungal medicines.  Laser treatment of the nail.  Surgery to remove the nail. This may be needed for the most severe infections.  Treatment takes a long time, and the infection may come back. Follow these instructions at home: Medicines  Take or apply over-the-counter and prescription medicines only as told by your health care provider.  Ask your health care provider about using over-the-counter mentholated ointment on your nails. Lifestyle   Do not share personal items, such as towels or nail clippers.  Trim your nails often.  Wash and dry your hands and feet every day.  Wear absorbent socks, and change your socks frequently.  Wear shoes that allow air to circulate, such as sandals or canvas tennis shoes. Throw out old shoes.  Wear rubber gloves if you are working with your hands in wet areas.  Do not walk barefoot in shower rooms or locker rooms.  Do not use a nail salon that does not use clean instruments.  Do not use artificial nails. General instructions  Keep all follow-up visits as told by your health care provider. This is important.  Use antifungal foot powder on your feet and in your shoes. Contact  a health care provider if: Your infection is not getting better or it is getting worse after several months. This information is not intended to replace advice given to you by your health care provider. Make sure you discuss any questions you have with your health care provider. Document Released: 03/14/2000 Document Revised: 08/23/2015 Document Reviewed: 09/18/2014 Elsevier Interactive Patient Education  2018 [employer].    IF you received an x-ray today, you will receive an invoice  from [facility]. Please contact [facility] at [phone] with questions or concerns regarding your invoice.   IF you received labwork today, you will receive an invoice from [facility]. Please contact [facility] at [phone] with questions or concerns regarding your invoice.   Our billing staff will not be able to assist you with questions regarding bills from these companies.  You will be contacted with the lab results as soon as they are available. The fastest way to get your results is to activate your My Chart account. Instructions are located on the last page of this paperwork. If you have not heard from [place] regarding the results in 2 weeks, please contact this office.

## 2016-12-20 LAB — HEPATIC FUNCTION PANEL
ALBUMIN: 4.6 g/dL (ref 3.5–5.5)
ALT: 17 IU/L (ref 0–32)
AST: 21 IU/L (ref 0–40)
Alkaline Phosphatase: 93 IU/L (ref 39–117)
BILIRUBIN TOTAL: 0.2 mg/dL (ref 0.0–1.2)
BILIRUBIN, DIRECT: 0.07 mg/dL (ref 0.00–0.40)
TOTAL PROTEIN: 7.1 g/dL (ref 6.0–8.5)

## 2017-01-15 DIAGNOSIS — R350 Frequency of micturition: Secondary | ICD-10-CM | POA: Diagnosis not present

## 2017-01-15 DIAGNOSIS — N3946 Mixed incontinence: Secondary | ICD-10-CM | POA: Diagnosis not present

## 2017-02-02 ENCOUNTER — Ambulatory Visit (INDEPENDENT_AMBULATORY_CARE_PROVIDER_SITE_OTHER): Payer: BLUE CROSS/BLUE SHIELD | Admitting: Family Medicine

## 2017-02-02 DIAGNOSIS — B351 Tinea unguium: Secondary | ICD-10-CM | POA: Diagnosis not present

## 2017-02-02 NOTE — Progress Notes (Signed)
Hepatic function panel

## 2017-02-03 LAB — HEPATIC FUNCTION PANEL
ALK PHOS: 98 IU/L (ref 39–117)
ALT: 14 IU/L (ref 0–32)
AST: 14 IU/L (ref 0–40)
Albumin: 4.3 g/dL (ref 3.5–5.5)
BILIRUBIN, DIRECT: 0.07 mg/dL (ref 0.00–0.40)
Bilirubin Total: <0.2 mg/dL (ref 0.0–1.2)
Total Protein: 6.9 g/dL (ref 6.0–8.5)

## 2017-02-08 ENCOUNTER — Other Ambulatory Visit: Payer: Self-pay | Admitting: Family Medicine

## 2017-02-08 DIAGNOSIS — B351 Tinea unguium: Secondary | ICD-10-CM

## 2017-02-08 MED ORDER — TERBINAFINE HCL 250 MG PO TABS: 250.0000 mg | ORAL_TABLET | Freq: Every day | ORAL | 0 refills | Status: DC

## 2017-04-15 ENCOUNTER — Other Ambulatory Visit: Payer: Self-pay | Admitting: Family Medicine

## 2017-04-15 DIAGNOSIS — B351 Tinea unguium: Secondary | ICD-10-CM

## 2017-04-15 NOTE — Telephone Encounter (Signed)
Don't see where she is on the med. (Lamisil)

## 2017-04-15 NOTE — Telephone Encounter (Signed)
Copied from [place] [id number]. Topic: Quick Communication - Rx Refill/Question >> Apr 15, 2017 12:14 PM Aurelio Brash B wrote: Medication:terbinafine (LAMISIL) 250 MG tablet  Has the patient contacted their pharmacy? No  no refills   (Agent: If no, request that the patient contact the pharmacy for the refill.)   Preferred Pharmacy (with phone number or street name):[facility], [place] [phone] (Phone) [phone] (Fax)     Agent: Please be advised that RX refills may take up to 3 business days. We ask that you follow-up with your pharmacy.

## 2017-04-16 NOTE — Telephone Encounter (Signed)
Patient is requesting a refill of the following medications: Requested Prescriptions   Pending Prescriptions Disp Refills  . terbinafine (LAMISIL) 250 MG tablet 30 tablet 0    Sig: Take 1 tablet (250 mg total) by mouth daily.    Date of patient request: 02/13/18 Last office visit: 02/02/17 Date of last refill: 02/08/17 Last refill amount: #30 tablets, no refills Follow up time period per chart: Not specified  Hepatic function panel normal on 02/02/17.  Provider, please advise.

## 2017-04-16 NOTE — Addendum Note (Signed)
Addended by: Delle Reining on: 04/16/2017 05:26 PM   Modules accepted: Orders

## 2017-04-17 MED ORDER — TERBINAFINE HCL 250 MG PO TABS: 250.0000 mg | ORAL_TABLET | Freq: Every day | ORAL | 0 refills | Status: DC

## 2017-04-17 NOTE — Addendum Note (Signed)
Addended by: [name] R on: 04/17/2017 04:33 PM   Modules accepted: Orders

## 2017-04-17 NOTE — Telephone Encounter (Signed)
One additional month of Lamisil provided, but please have her follow up during that time if symptoms have not resolved. We'll need to monitor liver tests prior to any further refills.

## 2017-05-06 ENCOUNTER — Other Ambulatory Visit: Payer: Self-pay | Admitting: Family Medicine

## 2017-05-13 ENCOUNTER — Other Ambulatory Visit: Payer: Self-pay | Admitting: Family Medicine

## 2017-05-13 DIAGNOSIS — B351 Tinea unguium: Secondary | ICD-10-CM

## 2017-05-13 NOTE — Telephone Encounter (Signed)
Refill of Lamisil  LOV 02/02/17   LRF 04/17/17 #30  0 refills  [place], [place]

## 2017-05-13 NOTE — Telephone Encounter (Signed)
  Refill of Lamisil  LOV 02/02/17   LRF 04/17/17 #30  0 refills  [place], [place]

## 2017-05-16 NOTE — Telephone Encounter (Signed)
Last seen in September 2018.  Needs office visit to discuss plan and if repeat Lamisil needed, will need repeat liver function testing.

## 2017-05-22 ENCOUNTER — Other Ambulatory Visit: Payer: Self-pay

## 2017-05-22 ENCOUNTER — Ambulatory Visit (INDEPENDENT_AMBULATORY_CARE_PROVIDER_SITE_OTHER): Payer: BLUE CROSS/BLUE SHIELD | Admitting: Family Medicine

## 2017-05-22 ENCOUNTER — Encounter: Payer: Self-pay | Admitting: Family Medicine

## 2017-05-22 VITALS — BP 136/76 | HR 80 | Temp 97.7°F | Resp 16 | Ht 64.37 in | Wt 156.0 lb

## 2017-05-22 DIAGNOSIS — M2550 Pain in unspecified joint: Secondary | ICD-10-CM | POA: Diagnosis not present

## 2017-05-22 DIAGNOSIS — R7989 Other specified abnormal findings of blood chemistry: Secondary | ICD-10-CM | POA: Diagnosis not present

## 2017-05-22 DIAGNOSIS — Z5181 Encounter for therapeutic drug level monitoring: Secondary | ICD-10-CM | POA: Diagnosis not present

## 2017-05-22 DIAGNOSIS — R6889 Other general symptoms and signs: Secondary | ICD-10-CM | POA: Diagnosis not present

## 2017-05-22 DIAGNOSIS — B351 Tinea unguium: Secondary | ICD-10-CM | POA: Diagnosis not present

## 2017-05-22 DIAGNOSIS — Z130 Encounter for screening for diseases of the blood and blood-forming organs and certain disorders involving the immune mechanism: Secondary | ICD-10-CM | POA: Diagnosis not present

## 2017-05-22 DIAGNOSIS — Z1329 Encounter for screening for other suspected endocrine disorder: Secondary | ICD-10-CM | POA: Diagnosis not present

## 2017-05-22 MED ORDER — TERBINAFINE HCL 250 MG PO TABS: 250.0000 mg | ORAL_TABLET | Freq: Every day | ORAL | 0 refills | Status: DC

## 2017-05-22 NOTE — Patient Instructions (Addendum)
Try another 12 weeks of Lamisil. I will check liver tests today, follow-up with me in 6 weeks for repeat testing.  For cold natured symptoms, I will check thyroid test, blood counts and vitamin D level. Depending on that reading can decide on high potency vitamin D versus 2000 units per day over-the-counter.  For aches and pains, you can try Tylenol as needed, glucosamine/chondroitin supplement that may help with arthritis, but please follow-up to discuss those symptoms further. If you do not achieve any relief in symptoms with 6 weeks of glucosamine, would not recommend using that further. If vitamin D is low, replacing that level can also sometimes help different aches and pains.  Return to the clinic or go to the nearest emergency room if any of your symptoms worsen or new symptoms occur.  Fungal Nail Infection Fungal nail infection is a common fungal infection of the toenails or fingernails. This condition affects toenails more often than fingernails. More than one nail may be infected. The condition can be passed from person to person (is contagious). What are the causes? This condition is caused by a fungus. Several types of funguses can cause the infection. These funguses are common in moist and warm areas. If your hands or feet come into contact with the fungus, it may get into a crack in your fingernail or toenail and cause the infection. What increases the risk? The following factors may make you more likely to develop this condition:  Being female.  Having diabetes.  Being of older age.  Living with someone who has the fungus.  Walking barefoot in areas where the fungus thrives, such as showers or locker rooms.  Having poor circulation.  Wearing shoes and socks that cause your feet to sweat.  Having athlete's foot.  Having a nail injury or history of a recent nail surgery.  Having psoriasis.  Having a weak body defense system (immune system).  What are the signs or  symptoms? Symptoms of this condition include:  A pale spot on the nail.  Thickening of the nail.  A nail that becomes yellow or brown.  A brittle or ragged nail edge.  A crumbling nail.  A nail that has lifted away from the nail bed.  How is this diagnosed? This condition is diagnosed with a physical exam. Your health care provider may take a scraping or clipping from your nail to test for the fungus. How is this treated? Mild infections do not need treatment. If you have significant nail changes, treatment may include:  Oral antifungal medicines. You may need to take the medicine for several weeks or several months, and you may not see the results for a long time. These medicines can cause side effects. Ask your health care provider what problems to watch for.  Antifungal nail polish and nail cream. These may be used along with oral antifungal medicines.  Laser treatment of the nail.  Surgery to remove the nail. This may be needed for the most severe infections.  Treatment takes a long time, and the infection may come back. Follow these instructions at home: Medicines  Take or apply over-the-counter and prescription medicines only as told by your health care provider.  Ask your health care provider about using over-the-counter mentholated ointment on your nails. Lifestyle   Do not share personal items, such as towels or nail clippers.  Trim your nails often.  Wash and dry your hands and feet every day.  Wear absorbent socks, and change your socks frequently.  Wear shoes that allow air to circulate, such as sandals or canvas tennis shoes. Throw out old shoes.  Wear rubber gloves if you are working with your hands in wet areas.  Do not walk barefoot in shower rooms or locker rooms.  Do not use a nail salon that does not use clean instruments.  Do not use artificial nails. General instructions  Keep all follow-up visits as told by your health care provider. This  is important.  Use antifungal foot powder on your feet and in your shoes. Contact a health care provider if: Your infection is not getting better or it is getting worse after several months. This information is not intended to replace advice given to you by your health care provider. Make sure you discuss any questions you have with your health care provider. Document Released: 03/14/2000 Document Revised: 08/23/2015 Document Reviewed: 09/18/2014 Elsevier Interactive Patient Education  2018 [employer].   IF you received an x-ray today, you will receive an invoice from [facility]. Please contact [facility] at [phone] with questions or concerns regarding your invoice.   IF you received labwork today, you will receive an invoice from [facility]. Please contact [facility] at [phone] with questions or concerns regarding your invoice.   Our billing staff will not be able to assist you with questions regarding bills from these companies.  You will be contacted with the lab results as soon as they are available. The fastest way to get your results is to activate your My Chart account. Instructions are located on the last page of this paperwork. If you have not heard from [place] regarding the results in 2 weeks, please contact this office.

## 2017-05-22 NOTE — Progress Notes (Signed)
By signing my name below, I, Megan Hooper, attest that this documentation has been prepared under the direction and in the presence of [name], [name], MD. Electronically Signed: Mayer Hooper, Medical Scribe 05/22/2017 at 1:04 PM.  Subjective:    Patient ID: Megan Hooper, female    DOB: 08-15-1955, 62 y.o.   MRN: [id number]  HPI Chief Complaint  Patient presents with  . Medication Refill    Vitamin D and lamisil refill   Megan Hooper is a 62 y.o. female who presents to Primary Care at [facility] for medication refill. She was treated onychomycosis. Saw her in sept 2018, she had tried otc treatments and thickened discolored toe nails for years. Started lamisil 250 mg QD, initial LFT's were normal. Repeat LFT Nov 2018 were also normal. She used the lamisil but ran out within the last week. She has not missed any doses. There was some improvement, especially in her smaller toes, there are less ridges. However, her big toes are not improved. She denies any complications to her medications.  Vitamin D: She was placed on this after seeing Megan Hooper. Apr 09 2016, her last Vit D was 19.2. She is also requesting a refill for her ergocalciferol (50,000 IUs weekly). She is not taking any OTC vitamin D supplements at the time. She has been out of this supplement for 1 month. 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 Her last Vitamin D supplements worked well for her and improved her symptoms. She denies unexplained weight loss. She has a h/o hysterectomy and was on anemia for this previously, but since she stopped having this issue, she stopped taking iron supplements.   Arthralgias: She also complains of knee pain as well as other joint pains. She was using Vioxx in the past with some improvement.  Patient Active Problem List   Diagnosis Date Noted  . Osteopenia 12/20/2015  . Allergy to cats 12/19/2015  . Asthma, mild intermittent 12/19/2015  .  Hyperlipidemia 09/19/2014  . Hot flashes 09/19/2014   Past Medical History:  Diagnosis Date  . Allergy   . Asthma   . Dyslipidemia   . Leukopenia    mild  . Osteopenia    Past Surgical History:  Procedure Laterality Date  . ABDOMINAL HYSTERECTOMY     Allergies  Allergen Reactions  . Codeine    Prior to Admission medications   Medication Sig Start Date End Date Taking? Authorizing Provider  albuterol (PROVENTIL HFA;VENTOLIN HFA) 108 (90 Base) MCG/ACT inhaler Inhale 1-2 puffs into the lungs every 4 (four) hours as needed for wheezing or shortness of breath. 12/18/15   [name], MD  mirabegron ER (MYRBETRIQ) 50 MG TB24 tablet Take 50 mg by mouth daily.    [provider]  terbinafine (LAMISIL) 250 MG tablet Take 1 tablet (250 mg total) by mouth daily. 04/17/17   [name], MD  DITROPAN XL 5 MG 24 hr tablet TAKE 1 TABLET ONCE DAILY. 01/10/12 04/23/13  [name], PA-C   Social History   Socioeconomic History  . Marital status: Divorced    Spouse name: Not on file  . Number of children: Not on file  . Years of education: Not on file  . Highest education level: Not on file  Social Needs  . Financial resource strain: Not on file  . Food insecurity - worry: Not on file  . Food insecurity - inability: Not on file  . Transportation  needs - medical: Not on file  . Transportation needs - non-medical: Not on file  Occupational History  . Occupation: Pharmacist, hospital  Tobacco Use  . Smoking status: Former Smoker    Packs/day: 0.50    Years: 13.00    Pack years: 6.50    Types: Cigarettes    Start date: 05/26/1978    Last attempt to quit: 10/22/1991    Years since quitting: 25.6  . Smokeless tobacco: Never Used  . Tobacco comment: quit  13 years ago  Substance and Sexual Activity  . Alcohol use: No    Alcohol/week: 0.0 oz  . Drug use: No  . Sexual activity: No  Other Topics Concern  . Not on file  Social History Narrative  . Not on file   Vitals:    05/22/17 1322  BP: 136/76  Pulse: 80  Resp: 16  Temp: 97.7 F (36.5 C)  Weight: 156 lb (70.8 kg)  Height: 5' 4.37" (1.635 m)    Review of Systems  Constitutional: Negative for unexpected weight change.  Endocrine: Positive for cold intolerance.  Musculoskeletal: Positive for arthralgias.       Objective:   Physical Exam  Constitutional: She is oriented to person, place, and time. She appears well-developed and well-nourished.  HENT:  Head: Normocephalic and atraumatic.  Eyes: Conjunctivae and EOM are normal. Pupils are equal, round, and reactive to light.  Neck: Carotid bruit is not present.  Cardiovascular: Normal rate, regular rhythm, normal heart sounds and intact distal pulses.  Pulmonary/Chest: Effort normal and breath sounds normal.  Abdominal: Soft. She exhibits no pulsatile midline mass. There is no tenderness.  Musculoskeletal:  R foot: Thickened discolored nail distal 3/4 on the great toe. Minimal thickening of the 5th toe.  L foot: thickened, discolored nail distal 2/3 of great toe, slight discoloration of 5th toe.  Proximal aspect appears normal.  Neurological: She is alert and oriented to person, place, and time.  Skin: Skin is warm and dry.  Psychiatric: She has a normal mood and affect. Her behavior is normal.  Vitals reviewed.       Assessment & Plan:    Megan Hooper is a 62 y.o. female Low vitamin D level - Plan: Vitamin D, 25-hydroxy  - Previously low at 58. Off supplements for the past 1 month. Recheck vitamin D, then consider 50,000 units once per week versus 2000 units per day. Potentially had some improvement in the past and cold intolerance with vitamin D supplementation, could also potentially have improvement in various arthralgias. Labs pending  Onychomycosis - Plan: terbinafine (LAMISIL) 250 MG tablet, Hepatic Function Panel Medication monitoring encounter - Plan: Hepatic Function Panel  - Improved, still some discoloration of distal great  nails. Continue for another 12 weeks, baseline hepatic panel, recheck 6 weeks.   Cold intolerance - Plan: CBC, TSH  - Vitamin D screening as above, check CBC, TSH. RTC precautions to discuss further if no source found on today's labs   Screening, anemia, deficiency, iron - Plan: CBC  Screening for thyroid disorder - Plan: TSH  Arthralgia, unspecified joint  - There is arthralgias differential includes degenerative changes, but plans to follow-up to discuss further. Did give option of Tylenol, trial of glucosamine/chondroitin, and if low vitamin D, supplementation may also help. Recheck to discuss further.  Meds ordered this encounter  Medications  . terbinafine (LAMISIL) 250 MG tablet    Sig: Take 1 tablet (250 mg total) by mouth daily.    Dispense:  90  tablet    Refill:  0   Patient Instructions    Try another 12 weeks of Lamisil. I will check liver tests today, follow-up with me in 6 weeks for repeat testing.  For cold natured symptoms, I will check thyroid test, blood counts and vitamin D level. Depending on that reading can decide on high potency vitamin D versus 2000 units per day over-the-counter.  For aches and pains, you can try Tylenol as needed, glucosamine/chondroitin supplement that may help with arthritis, but please follow-up to discuss those symptoms further. If you do not achieve any relief in symptoms with 6 weeks of glucosamine, would not recommend using that further. If vitamin D is low, replacing that level can also sometimes help different aches and pains.  Return to the clinic or go to the nearest emergency room if any of your symptoms worsen or new symptoms occur.  Fungal Nail Infection Fungal nail infection is a common fungal infection of the toenails or fingernails. This condition affects toenails more often than fingernails. More than one nail may be infected. The condition can be passed from person to person (is contagious). What are the causes? This  condition is caused by a fungus. Several types of funguses can cause the infection. These funguses are common in moist and warm areas. If your hands or feet come into contact with the fungus, it may get into a crack in your fingernail or toenail and cause the infection. What increases the risk? The following factors may make you more likely to develop this condition:  Being female.  Having diabetes.  Being of older age.  Living with someone who has the fungus.  Walking barefoot in areas where the fungus thrives, such as showers or locker rooms.  Having poor circulation.  Wearing shoes and socks that cause your feet to sweat.  Having athlete's foot.  Having a nail injury or history of a recent nail surgery.  Having psoriasis.  Having a weak body defense system (immune system).  What are the signs or symptoms? Symptoms of this condition include:  A pale spot on the nail.  Thickening of the nail.  A nail that becomes yellow or brown.  A brittle or ragged nail edge.  A crumbling nail.  A nail that has lifted away from the nail bed.  How is this diagnosed? This condition is diagnosed with a physical exam. Your health care provider may take a scraping or clipping from your nail to test for the fungus. How is this treated? Mild infections do not need treatment. If you have significant nail changes, treatment may include:  Oral antifungal medicines. You may need to take the medicine for several weeks or several months, and you may not see the results for a long time. These medicines can cause side effects. Ask your health care provider what problems to watch for.  Antifungal nail polish and nail cream. These may be used along with oral antifungal medicines.  Laser treatment of the nail.  Surgery to remove the nail. This may be needed for the most severe infections.  Treatment takes a long time, and the infection may come back. Follow these instructions at  home: Medicines  Take or apply over-the-counter and prescription medicines only as told by your health care provider.  Ask your health care provider about using over-the-counter mentholated ointment on your nails. Lifestyle   Do not share personal items, such as towels or nail clippers.  Trim your nails often.  Wash and dry  your hands and feet every day.  Wear absorbent socks, and change your socks frequently.  Wear shoes that allow air to circulate, such as sandals or canvas tennis shoes. Throw out old shoes.  Wear rubber gloves if you are working with your hands in wet areas.  Do not walk barefoot in shower rooms or locker rooms.  Do not use a nail salon that does not use clean instruments.  Do not use artificial nails. General instructions  Keep all follow-up visits as told by your health care provider. This is important.  Use antifungal foot powder on your feet and in your shoes. Contact a health care provider if: Your infection is not getting better or it is getting worse after several months. This information is not intended to replace advice given to you by your health care provider. Make sure you discuss any questions you have with your health care provider. Document Released: 03/14/2000 Document Revised: 08/23/2015 Document Reviewed: 09/18/2014 Elsevier Interactive Patient Education  2018 [employer].   IF you received an x-ray today, you will receive an invoice from [facility]. Please contact [facility] at [phone] with questions or concerns regarding your invoice.   IF you received labwork today, you will receive an invoice from [place]. Please contact [facility] at [phone] with questions or concerns regarding your invoice.   Our billing staff will not be able to assist you with questions regarding bills from these companies.  You will be contacted with the lab results as soon as they are available. The fastest way to get your  results is to activate your My Chart account. Instructions are located on the last page of this paperwork. If you have not heard from [place] regarding the results in 2 weeks, please contact this office.       I personally performed the services described in this documentation, which was scribed in my presence. The recorded information has been reviewed and considered for accuracy and completeness, addended by me as needed, and agree with information above.  Signed,   [name], MD Primary Care at [place].  05/22/17 2:34 PM

## 2017-05-23 LAB — HEPATIC FUNCTION PANEL
ALBUMIN: 4.5 g/dL (ref 3.6–4.8)
ALK PHOS: 100 IU/L (ref 39–117)
ALT: 16 IU/L (ref 0–32)
AST: 16 IU/L (ref 0–40)
BILIRUBIN, DIRECT: 0.08 mg/dL (ref 0.00–0.40)
Bilirubin Total: 0.3 mg/dL (ref 0.0–1.2)
TOTAL PROTEIN: 7 g/dL (ref 6.0–8.5)

## 2017-05-23 LAB — CBC
HEMATOCRIT: 40.9 % (ref 34.0–46.6)
HEMOGLOBIN: 13.7 g/dL (ref 11.1–15.9)
MCH: 27.7 pg (ref 26.6–33.0)
MCHC: 33.5 g/dL (ref 31.5–35.7)
MCV: 83 fL (ref 79–97)
Platelets: 351 10*3/uL (ref 150–379)
RBC: 4.94 x10E6/uL (ref 3.77–5.28)
RDW: 15.7 % — ABNORMAL HIGH (ref 12.3–15.4)
WBC: 4.3 10*3/uL (ref 3.4–10.8)

## 2017-05-23 LAB — VITAMIN D 25 HYDROXY (VIT D DEFICIENCY, FRACTURES): VIT D 25 HYDROXY: 43.4 ng/mL (ref 30.0–100.0)

## 2017-05-23 LAB — TSH: TSH: 1.34 u[IU]/mL (ref 0.450–4.500)

## 2017-07-06 ENCOUNTER — Other Ambulatory Visit: Payer: Self-pay

## 2017-07-06 ENCOUNTER — Encounter: Payer: Self-pay | Admitting: Family Medicine

## 2017-07-06 ENCOUNTER — Ambulatory Visit (INDEPENDENT_AMBULATORY_CARE_PROVIDER_SITE_OTHER): Payer: BLUE CROSS/BLUE SHIELD | Admitting: Family Medicine

## 2017-07-06 VITALS — BP 138/77 | HR 79 | Temp 97.6°F | Ht 65.0 in | Wt 153.8 lb

## 2017-07-06 DIAGNOSIS — D225 Melanocytic nevi of trunk: Secondary | ICD-10-CM

## 2017-07-06 DIAGNOSIS — B351 Tinea unguium: Secondary | ICD-10-CM

## 2017-07-06 DIAGNOSIS — D2272 Melanocytic nevi of left lower limb, including hip: Secondary | ICD-10-CM | POA: Diagnosis not present

## 2017-07-06 NOTE — Progress Notes (Signed)
Subjective:  By signing my name below, I, [name], attest that this documentation has been prepared under the direction and in the presence of [name], MD Electronically Signed: Ladene Artist, ED Scribe 07/06/2017 at 9:06 AM.   Patient ID: Megan Hooper, female    DOB: 1955-12-23, 62 y.o.   MRN: [id number]  Chief Complaint  Patient presents with  . Nevus    thinking dematologist about getting removed   HPI Megan Hooper is a 62 y.o. female who presents to Primary Care at [facility] to discuss abnormal moles. Last seen 05/22/17. Pt has had a mole to the back of her neck/R shoulder for a little more than a yr. States the mole feels a little crusty and has felt that way the entire time but is irritated when it rubs again her bra. Denies change in size or bleeding.  Pt also has a new mole to the back of the L leg that she first noticed last week. Reports that the mole feels a little firm. Denies pain, bleeding, redness. No family h/o skin CA. Pt has not been seen by dermatology in the past.  Onychomycosis of Toenails Previously treated with Lamisil. Decided to restart in Feb for another 12 wks. Initial liver test normal. She has not noticed any changes to nails yet.  Patient Active Problem List   Diagnosis Date Noted  . Osteopenia 12/20/2015  . Allergy to cats 12/19/2015  . Asthma, mild intermittent 12/19/2015  . Hyperlipidemia 09/19/2014  . Hot flashes 09/19/2014   Past Medical History:  Diagnosis Date  . Allergy   . Asthma   . Dyslipidemia   . Leukopenia    mild  . Osteopenia    Past Surgical History:  Procedure Laterality Date  . ABDOMINAL HYSTERECTOMY     Allergies  Allergen Reactions  . Codeine    Prior to Admission medications   Medication Sig Start Date End Date Taking? Authorizing Provider  albuterol (PROVENTIL HFA;VENTOLIN HFA) 108 (90 Base) MCG/ACT inhaler Inhale 1-2 puffs into the lungs every 4 (four) hours as needed for wheezing or shortness of  breath. 12/18/15   [name], MD  ergocalciferol (VITAMIN D2) 50000 units capsule Take 50,000 Units by mouth once a week.    [provider]  mirabegron ER (MYRBETRIQ) 50 MG TB24 tablet Take 50 mg by mouth daily.    [provider]  terbinafine (LAMISIL) 250 MG tablet Take 1 tablet (250 mg total) by mouth daily. 05/22/17   [name], MD  DITROPAN XL 5 MG 24 hr tablet TAKE 1 TABLET ONCE DAILY. 01/10/12 04/23/13  [name], PA-C   Social History   Socioeconomic History  . Marital status: Divorced    Spouse name: Not on file  . Number of children: Not on file  . Years of education: Not on file  . Highest education level: Not on file  Occupational History  . Occupation: Pharmacist, hospital  Social Needs  . Financial resource strain: Not on file  . Food insecurity:    Worry: Not on file    Inability: Not on file  . Transportation needs:    Medical: Not on file    Non-medical: Not on file  Tobacco Use  . Smoking status: Former Smoker    Packs/day: 0.50    Years: 13.00    Pack years: 6.50    Types: Cigarettes    Start date: 05/26/1978    Last attempt to quit: 10/22/1991  Years since quitting: 25.7  . Smokeless tobacco: Never Used  . Tobacco comment: quit  13 years ago  Substance and Sexual Activity  . Alcohol use: No    Alcohol/week: 0.0 oz  . Drug use: No  . Sexual activity: Never  Lifestyle  . Physical activity:    Days per week: Not on file    Minutes per session: Not on file  . Stress: Not on file  Relationships  . Social connections:    Talks on phone: Not on file    Gets together: Not on file    Attends religious service: Not on file    Active member of club or organization: Not on file    Attends meetings of clubs or organizations: Not on file    Relationship status: Not on file  . Intimate partner violence:    Fear of current or ex partner: Not on file    Emotionally abused: Not on file    Physically abused: Not on file    Forced  sexual activity: Not on file  Other Topics Concern  . Not on file  Social History Narrative  . Not on file   Review of Systems  Skin: Negative for color change.       + nevus to R shoulder and L leg      Objective:   Physical Exam  Constitutional: She is oriented to person, place, and time. She appears well-developed and well-nourished. No distress.  HENT:  Head: Normocephalic and atraumatic.  Eyes: Conjunctivae and EOM are normal.  Neck: Neck supple. No tracheal deviation present.  Cardiovascular: Normal rate.  Pulmonary/Chest: Effort normal. No respiratory distress.  Musculoskeletal: Normal range of motion.  Neurological: She is alert and oriented to person, place, and time.  Skin: Skin is warm and dry.  L posterior calf: ~3 mm across dark brown/black elevated lesion with multiple elements/projections. Light tan base. R shoulder: nevus measuring 7 mm x 9 mm. Light tan except for upper most 4 mm with has a dark elevated component. Great toenails: thick, L greater than R, but covered with polish.  Psychiatric: She has a normal mood and affect. Her behavior is normal.  Nursing note and vitals reviewed.  Vitals:   07/06/17 0900  BP: 138/77  Pulse: 79  Temp: 97.6 F (36.4 C)  TempSrc: Oral  SpO2: 99%  Weight: 153 lb 12.8 oz (69.8 kg)  Height: 5\' 5"  (1.651 m)      Assessment & Plan:  Megan Hooper is a 62 y.o. female Onychomycosis - Plan: Hepatic Function Panel  -Appears to still have involvement of great toenails.  Continue Lamisil, check hepatic function panel, follow-up in approximately 6 weeks, or prior to Lamisil running out to determine if extended course needed with repeat LFTs  Atypical nevus of back - Plan: Ambulatory referral to Dermatology Nevus of left lower leg - Plan: Ambulatory referral to Dermatology  -Based on size and appearance of back greater than leg lesion would like dermatology to evaluate for possible excisions as well as other skin check.   Referral placed.  Abcde's of skin cancer screening were discussed  No orders of the defined types were placed in this encounter.  Patient Instructions    Based on the appearance of the 2 moles I would like you to see dermatology.  I did place a referral. You should be receiving a phone call in the next 2 weeks.   For the abnormal toenails, continue Lamisil.  I will check  liver testing today.  Follow-up prior to medication running out if you still have abnormal appearance of the toenails so we can repeat liver testing and possibly extend the Lamisil prescription.  Let me know if you have questions in the meantime.   IF you received an x-ray today, you will receive an invoice from [facility]. Please contact [facility] Rad[facility] at [phone] with questions or concerns regarding your invoice.   IF you received labwork today, you will receive an invoice from [place]. Please contact [facility] at [phone] with questions or concerns regarding your invoice.   Our billing staff will not be able to assist you with questions regarding bills from these companies.  You will be contacted with the lab results as soon as they are available. The fastest way to get your results is to activate your My Chart account. Instructions are located on the last page of this paperwork. If you have not heard from [place] regarding the results in 2 weeks, please contact this office.       I personally performed the services described in this documentation, which was scribed in my presence. The recorded information has been reviewed and considered for accuracy and completeness, addended by me as needed, and agree with information above.  Signed,   [name], MD Primary Care at [place].  07/06/17 9:21 AM

## 2017-07-06 NOTE — Patient Instructions (Addendum)
  Based on the appearance of the 2 moles I would like you to see dermatology.  I did place a referral. You should be receiving a phone call in the next 2 weeks.   For the abnormal toenails, continue Lamisil.  I will check liver testing today.  Follow-up prior to medication running out if you still have abnormal appearance of the toenails so we can repeat liver testing and possibly extend the Lamisil prescription.  Let me know if you have questions in the meantime.   IF you received an x-ray today, you will receive an invoice from [facility]. Please contact [facility] Radiology at [phone] with questions or concerns regarding your invoice.   IF you received labwork today, you will receive an invoice from [place]. Please contact [facility] at [phone] with questions or concerns regarding your invoice.   Our billing staff will not be able to assist you with questions regarding bills from these companies.  You will be contacted with the lab results as soon as they are available. The fastest way to get your results is to activate your My Chart account. Instructions are located on the last page of this paperwork. If you have not heard from [place] regarding the results in 2 weeks, please contact this office.

## 2017-07-07 LAB — HEPATIC FUNCTION PANEL
ALBUMIN: 4.4 g/dL (ref 3.6–4.8)
ALT: 14 IU/L (ref 0–32)
AST: 15 IU/L (ref 0–40)
Alkaline Phosphatase: 98 IU/L (ref 39–117)
BILIRUBIN, DIRECT: 0.08 mg/dL (ref 0.00–0.40)
Total Protein: 7.1 g/dL (ref 6.0–8.5)

## 2017-07-09 ENCOUNTER — Other Ambulatory Visit: Payer: Self-pay | Admitting: Family Medicine

## 2017-07-09 ENCOUNTER — Encounter: Payer: Self-pay | Admitting: *Deleted

## 2017-07-09 DIAGNOSIS — B351 Tinea unguium: Secondary | ICD-10-CM

## 2017-08-14 ENCOUNTER — Encounter: Payer: Self-pay | Admitting: Family Medicine

## 2017-08-14 ENCOUNTER — Other Ambulatory Visit: Payer: Self-pay

## 2017-08-14 ENCOUNTER — Ambulatory Visit (INDEPENDENT_AMBULATORY_CARE_PROVIDER_SITE_OTHER): Payer: BLUE CROSS/BLUE SHIELD | Admitting: Family Medicine

## 2017-08-14 VITALS — BP 116/80 | HR 65 | Temp 97.6°F | Ht 65.0 in | Wt 155.8 lb

## 2017-08-14 DIAGNOSIS — B351 Tinea unguium: Secondary | ICD-10-CM

## 2017-08-14 NOTE — Patient Instructions (Addendum)
Call dermatologist as they should have a referral from [place].  I would discuss the moles and treatment options for your toenails. Ok to continue the Lamisil for now.   Dr. Michelene Gardener, MD  Dermatologist in [place], [place]  Phone: [phone]   Fungal Nail Infection Fungal nail infection is a common fungal infection of the toenails or fingernails. This condition affects toenails more often than fingernails. More than one nail may be infected. The condition can be passed from person to person (is contagious). What are the causes? This condition is caused by a fungus. Several types of funguses can cause the infection. These funguses are common in moist and warm areas. If your hands or feet come into contact with the fungus, it may get into a crack in your fingernail or toenail and cause the infection. What increases the risk? The following factors may make you more likely to develop this condition:  Being female.  Having diabetes.  Being of older age.  Living with someone who has the fungus.  Walking barefoot in areas where the fungus thrives, such as showers or locker rooms.  Having poor circulation.  Wearing shoes and socks that cause your feet to sweat.  Having athlete's foot.  Having a nail injury or history of a recent nail surgery.  Having psoriasis.  Having a weak body defense system (immune system).  What are the signs or symptoms? Symptoms of this condition include:  A pale spot on the nail.  Thickening of the nail.  A nail that becomes yellow or brown.  A brittle or ragged nail edge.  A crumbling nail.  A nail that has lifted away from the nail bed.  How is this diagnosed? This condition is diagnosed with a physical exam. Your health care provider may take a scraping or clipping from your nail to test for the fungus. How is this treated? Mild infections do not need treatment. If you have significant nail changes, treatment may  include:  Oral antifungal medicines. You may need to take the medicine for several weeks or several months, and you may not see the results for a long time. These medicines can cause side effects. Ask your health care provider what problems to watch for.  Antifungal nail polish and nail cream. These may be used along with oral antifungal medicines.  Laser treatment of the nail.  Surgery to remove the nail. This may be needed for the most severe infections.  Treatment takes a long time, and the infection may come back. Follow these instructions at home: Medicines  Take or apply over-the-counter and prescription medicines only as told by your health care provider.  Ask your health care provider about using over-the-counter mentholated ointment on your nails. Lifestyle   Do not share personal items, such as towels or nail clippers.  Trim your nails often.  Wash and dry your hands and feet every day.  Wear absorbent socks, and change your socks frequently.  Wear shoes that allow air to circulate, such as sandals or canvas tennis shoes. Throw out old shoes.  Wear rubber gloves if you are working with your hands in wet areas.  Do not walk barefoot in shower rooms or locker rooms.  Do not use a nail salon that does not use clean instruments.  Do not use artificial nails. General instructions  Keep all follow-up visits as told by your health care provider. This is important.  Use antifungal foot powder on your feet and in  your shoes. Contact a health care provider if: Your infection is not getting better or it is getting worse after several months. This information is not intended to replace advice given to you by your health care provider. Make sure you discuss any questions you have with your health care provider. Document Released: 03/14/2000 Document Revised: 08/23/2015 Document Reviewed: 09/18/2014 Elsevier Interactive Patient Education  2018 [employer].    IF you  received an x-ray today, you will receive an invoice from [facility]. Please contact [facility] Radiology at [phone] with questions or concerns regarding your invoice.   IF you received labwork today, you will receive an invoice from [place]. Please contact [facility] at [phone] with questions or concerns regarding your invoice.   Our billing staff will not be able to assist you with questions regarding bills from these companies.  You will be contacted with the lab results as soon as they are available. The fastest way to get your results is to activate your My Chart account. Instructions are located on the last page of this paperwork. If you have not heard from [place] regarding the results in 2 weeks, please contact this office.

## 2017-08-14 NOTE — Progress Notes (Signed)
Subjective:  By signing my name below, I, Megan Hooper, attest that this documentation has been prepared under the direction and in the presence of Megan Ray, MD. Electronically Signed: [name], [place]. 08/14/2017 , 5:39 PM .  Patient was seen in Room 11 .   Patient ID: Megan Hooper, female    DOB: 02/12/1956, 62 y.o.   MRN: [id number] Chief Complaint  Patient presents with  . Nail Problem    follow up. both big toes   HPI Megan Hooper is a 62 y.o. female  Patient had reported discolored toenails for several years, with home treatment of Listerine and tea tree oil on Sept 21, 2018. She was started Lamisil at that time. She was the seen for follow up on February 22nd, 2019, noted she had ran out of it and missed about 1 week of treatment. She had some improvement in the smaller toes, so continued on same dose. She was last seen on April 8th with onychomycosis, and did not notice any changes when discussed at that time. Decided to continue Lamisil for an additional 6 weeks. Here for follow up.   Patient states her toenails look about the same. She denies missing any doses. She denies any complications with her medication.   Patient Active Problem List   Diagnosis Date Noted  . Osteopenia 12/20/2015  . Allergy to cats 12/19/2015  . Asthma, mild intermittent 12/19/2015  . Hyperlipidemia 09/19/2014  . Hot flashes 09/19/2014   Past Medical History:  Diagnosis Date  . Allergy   . Asthma   . Dyslipidemia   . Leukopenia    mild  . Osteopenia    Past Surgical History:  Procedure Laterality Date  . ABDOMINAL HYSTERECTOMY     Allergies  Allergen Reactions  . Codeine    Prior to Admission medications   Medication Sig Start Date End Date Taking? Authorizing Provider  albuterol (PROVENTIL HFA;VENTOLIN HFA) 108 (90 Base) MCG/ACT inhaler Inhale 1-2 puffs into the lungs every 4 (four) hours as needed for wheezing or shortness of breath. 12/18/15   [name], MD   mirabegron ER (MYRBETRIQ) 50 MG TB24 tablet Take 50 mg by mouth daily.    [provider]  terbinafine (LAMISIL) 250 MG tablet TAKE 1 TABLET ONCE DAILY. 07/09/17   [name], MD  DITROPAN XL 5 MG 24 hr tablet TAKE 1 TABLET ONCE DAILY. 01/10/12 04/23/13  [name], PA-C   Social History   Socioeconomic History  . Marital status: Divorced    Spouse name: Not on file  . Number of children: Not on file  . Years of education: Not on file  . Highest education level: Not on file  Occupational History  . Occupation: Pharmacist, hospital  Social Needs  . Financial resource strain: Not on file  . Food insecurity:    Worry: Not on file    Inability: Not on file  . Transportation needs:    Medical: Not on file    Non-medical: Not on file  Tobacco Use  . Smoking status: Former Smoker    Packs/day: 0.50    Years: 13.00    Pack years: 6.50    Types: Cigarettes    Start date: 05/26/1978    Last attempt to quit: 10/22/1991    Years since quitting: 25.8  . Smokeless tobacco: Never Used  . Tobacco comment: quit  13 years ago  Substance and Sexual Activity  . Alcohol use: No    Alcohol/week: 0.0  oz  . Drug use: No  . Sexual activity: Never  Lifestyle  . Physical activity:    Days per week: Not on file    Minutes per session: Not on file  . Stress: Not on file  Relationships  . Social connections:    Talks on phone: Not on file    Gets together: Not on file    Attends religious service: Not on file    Active member of club or organization: Not on file    Attends meetings of clubs or organizations: Not on file    Relationship status: Not on file  . Intimate partner violence:    Fear of current or ex partner: Not on file    Emotionally abused: Not on file    Physically abused: Not on file    Forced sexual activity: Not on file  Other Topics Concern  . Not on file  Social History Narrative  . Not on file   Review of Systems  Constitutional: Negative for chills, fatigue,  fever and unexpected weight change.  Respiratory: Negative for cough.   Gastrointestinal: Negative for constipation, diarrhea, nausea and vomiting.  Skin: Negative for rash and wound.  Neurological: Negative for dizziness, weakness and headaches.       Objective:   Physical Exam  Constitutional: She is oriented to person, place, and time. She appears well-developed and well-nourished. No distress.  HENT:  Head: Normocephalic and atraumatic.  Eyes: Pupils are equal, round, and reactive to light. EOM are normal.  Neck: Neck supple.  Cardiovascular: Normal rate.  Pulmonary/Chest: Effort normal. No respiratory distress.  Musculoskeletal: Normal range of motion.  Neurological: She is alert and oriented to person, place, and time.  Skin: Skin is warm and dry.  Bilateral great toes: at the base of bilateral great toenails, there's minimal approximately 2-6mm normal appearing toenail, but remainder of the toenails is thickened and discolored  Psychiatric: She has a normal mood and affect. Her behavior is normal.  Nursing note and vitals reviewed.   Vitals:   08/14/17 1700  BP: 116/80  Pulse: 65  Temp: 97.6 F (36.4 C)  TempSrc: Oral  SpO2: 100%  Weight: 155 lb 12.8 oz (70.7 kg)  Height: 5\' 5"  (1.651 m)       Assessment & Plan:    Megan Hooper is a 62 y.o. female Onychomycosis Prolonged symptoms with Lamisil.  Previously referred to dermatology, advised that she discuss other treatment options with derm - okay to continue medicine for now.  Number provided for dermatology.  No orders of the defined types were placed in this encounter.  Patient Instructions    Call dermatologist as they should have a referral from [place].  I would discuss the moles and treatment options for your toenails. Ok to continue the Lamisil for now.   Dr. Michelene Gardener, MD  Dermatologist in [place], [place]  Phone: [phone]   Fungal Nail Infection Fungal nail infection is a  common fungal infection of the toenails or fingernails. This condition affects toenails more often than fingernails. More than one nail may be infected. The condition can be passed from person to person (is contagious). What are the causes? This condition is caused by a fungus. Several types of funguses can cause the infection. These funguses are common in moist and warm areas. If your hands or feet come into contact with the fungus, it may get into a crack in your fingernail or toenail and cause the infection.  What increases the risk? The following factors may make you more likely to develop this condition:  Being female.  Having diabetes.  Being of older age.  Living with someone who has the fungus.  Walking barefoot in areas where the fungus thrives, such as showers or locker rooms.  Having poor circulation.  Wearing shoes and socks that cause your feet to sweat.  Having athlete's foot.  Having a nail injury or history of a recent nail surgery.  Having psoriasis.  Having a weak body defense system (immune system).  What are the signs or symptoms? Symptoms of this condition include:  A pale spot on the nail.  Thickening of the nail.  A nail that becomes yellow or brown.  A brittle or ragged nail edge.  A crumbling nail.  A nail that has lifted away from the nail bed.  How is this diagnosed? This condition is diagnosed with a physical exam. Your health care provider may take a scraping or clipping from your nail to test for the fungus. How is this treated? Mild infections do not need treatment. If you have significant nail changes, treatment may include:  Oral antifungal medicines. You may need to take the medicine for several weeks or several months, and you may not see the results for a long time. These medicines can cause side effects. Ask your health care provider what problems to watch for.  Antifungal nail polish and nail cream. These may be used along with oral  antifungal medicines.  Laser treatment of the nail.  Surgery to remove the nail. This may be needed for the most severe infections.  Treatment takes a long time, and the infection may come back. Follow these instructions at home: Medicines  Take or apply over-the-counter and prescription medicines only as told by your health care provider.  Ask your health care provider about using over-the-counter mentholated ointment on your nails. Lifestyle   Do not share personal items, such as towels or nail clippers.  Trim your nails often.  Wash and dry your hands and feet every day.  Wear absorbent socks, and change your socks frequently.  Wear shoes that allow air to circulate, such as sandals or canvas tennis shoes. Throw out old shoes.  Wear rubber gloves if you are working with your hands in wet areas.  Do not walk barefoot in shower rooms or locker rooms.  Do not use a nail salon that does not use clean instruments.  Do not use artificial nails. General instructions  Keep all follow-up visits as told by your health care provider. This is important.  Use antifungal foot powder on your feet and in your shoes. Contact a health care provider if: Your infection is not getting better or it is getting worse after several months. This information is not intended to replace advice given to you by your health care provider. Make sure you discuss any questions you have with your health care provider. Document Released: 03/14/2000 Document Revised: 08/23/2015 Document Reviewed: 09/18/2014 Elsevier Interactive Patient Education  2018 [employer].    IF you received an x-Hooper today, you will receive an invoice from [facility]. Please contact [facility] at [phone] with questions or concerns regarding your invoice.   IF you received labwork today, you will receive an invoice from [place]. Please contact [facility] at [phone] with questions or concerns  regarding your invoice.   Our billing staff will not be able to assist you with questions regarding bills from these companies.  You will be contacted with the lab results as soon as they are available. The fastest way to get your results is to activate your My Chart account. Instructions are located on the last page of this paperwork. If you have not heard from [place] regarding the results in 2 weeks, please contact this office.       I personally performed the services described in this documentation, which was scribed in my presence. The recorded information has been reviewed and considered for accuracy and completeness, addended by me as needed, and agree with information above.  Signed,   Megan Ray, MD [facility] [place].  08/14/17 6:38 PM

## 2017-08-26 ENCOUNTER — Ambulatory Visit: Payer: BLUE CROSS/BLUE SHIELD | Admitting: Physician Assistant

## 2017-08-26 ENCOUNTER — Other Ambulatory Visit: Payer: Self-pay

## 2017-08-26 ENCOUNTER — Encounter: Payer: Self-pay | Admitting: Physician Assistant

## 2017-08-26 VITALS — BP 110/76 | HR 63 | Temp 97.6°F | Ht 65.0 in | Wt 151.4 lb

## 2017-08-26 DIAGNOSIS — L255 Unspecified contact dermatitis due to plants, except food: Secondary | ICD-10-CM

## 2017-08-26 DIAGNOSIS — J4520 Mild intermittent asthma, uncomplicated: Secondary | ICD-10-CM

## 2017-08-26 MED ORDER — CLOBETASOL PROPIONATE 0.05 % EX CREA: 1.0000 "application " | TOPICAL_CREAM | Freq: Two times a day (BID) | CUTANEOUS | 0 refills | Status: DC

## 2017-08-26 MED ORDER — PREDNISONE 20 MG PO TABS: ORAL_TABLET | ORAL | 0 refills | Status: DC

## 2017-08-26 MED ORDER — ALBUTEROL SULFATE HFA 108 (90 BASE) MCG/ACT IN AERS: 1.0000 | INHALATION_SPRAY | RESPIRATORY_TRACT | 0 refills | Status: DC | PRN

## 2017-08-26 NOTE — Progress Notes (Signed)
Megan Hooper  MRN: [id number] DOB: 02/16/1956  PCP: Megan Agreste, MD  Subjective:  Pt is a 62 year old female who presents to clinic for rash. She was cutting the hedges yesterday in the yard and now c/o rash and itching. Rash is on her right forearm. Her right eye itches and is swollen. +itching all over x 1 day.  Denies abdominal pain, vomiting, diarrhea, fever, chills, wheezing She had contact with poison oak in the past, did not seek medical treatment immediately and "the rash got out of control. It was everywhere".   Asthma - needs refill of ALbuterol.  She uses albuterol during allergy season.  She uses it "every couple of weeks."     Review of Systems  Constitutional: Negative for chills and fever.  Respiratory: Negative for chest tightness, shortness of breath and wheezing.   Gastrointestinal: Negative for abdominal pain, diarrhea, nausea and vomiting.  Skin: Positive for rash.    Patient Active Problem List   Diagnosis Date Noted  . Osteopenia 12/20/2015  . Allergy to cats 12/19/2015  . Asthma, mild intermittent 12/19/2015  . Hyperlipidemia 09/19/2014  . Hot flashes 09/19/2014    Current Outpatient Medications on File Prior to Visit  Medication Sig Dispense Refill  . albuterol (PROVENTIL HFA;VENTOLIN HFA) 108 (90 Base) MCG/ACT inhaler Inhale 1-2 puffs into the lungs every 4 (four) hours as needed for wheezing or shortness of breath. 18 g 0  . mirabegron ER (MYRBETRIQ) 50 MG TB24 tablet Take 50 mg by mouth daily.    Marland Kitchen terbinafine (LAMISIL) 250 MG tablet TAKE 1 TABLET ONCE DAILY. 90 tablet 0  . [DISCONTINUED] DITROPAN XL 5 MG 24 hr tablet TAKE 1 TABLET ONCE DAILY. 90 tablet PRN   No current facility-administered medications on file prior to visit.     Allergies  Allergen Reactions  . Codeine      Objective:  BP 110/76 (BP Location: Left Arm, Patient Position: Sitting, Cuff Size: Normal)   Pulse 63   Temp 97.6 F (36.4 C) (Oral)   Ht 5\' 5"  (1.651 m)    Wt 151 lb 6.4 oz (68.7 kg)   SpO2 97%   BMI 25.19 kg/m   Physical Exam  Constitutional: She is oriented to person, place, and time. No distress.  Eyes:    Mild edema right eye lid. No eye redness.   Neurological: She is alert and oriented to person, place, and time.  Skin: Skin is warm and dry. Rash (right forearm) noted. Rash is urticarial.  Psychiatric: Judgment normal.  Vitals reviewed.   Assessment and Plan :  1. Plant dermatitis - predniSONE (DELTASONE) 20 MG tablet; Take 3 PO QAM x6days, 2 PO QAM x6days, 1 PO QAM x6days  Dispense: 36 tablet; Refill: 0 - clobetasol cream (TEMOVATE) 0.05 %; Apply 1 application topically 2 (two) times daily.  Dispense: 30 g; Refill: 0 -Patient presents with rash and itching x1 day after coming in contact with possible poison oak.  She has had a rash similar to this in the past which rapidly worsened without treatment.  Plan to cover with 18-day prednisone taper and topical clobetasol.  Discussed with patient not to use clobetasol on her face and only use this for 1 week.  Return to clinic if no improvement/or worsening symptoms. 2. Mild intermittent asthma without complication - albuterol (PROVENTIL HFA;VENTOLIN HFA) 108 (90 Base) MCG/ACT inhaler; Inhale 1-2 puffs into the lungs every 4 (four) hours as needed for wheezing or shortness of breath.  Dispense: 18 g; Refill: 0   Megan Kalianna Verbeke, PA-C  [facility] [place] 08/26/2017 10:52 AM

## 2017-08-26 NOTE — Patient Instructions (Addendum)
Start taking Prednisone 20mg  tabs. You will take 3 pills in the morning with food for 6 days, the 2 pills in the morning with food for 6days, then 1 pill in the morning with food for 6 days  Clobetasol 0.05% is a very powerful topical steroid. DO NOT USE THIS ON YOUR FACE. Apply this to your arm twice daily for up to one week. Do not use this for longer than one week as long-term use of this medication can thin your skin.    Poison Ivy Dermatitis Poison ivy dermatitis is inflammation of the skin that is caused by the allergens on the leaves of the poison ivy plant. The skin reaction often involves redness, swelling, blisters, and extreme itching. What are the causes? This condition is caused by a specific chemical (urushiol) found in the sap of the poison ivy plant. This chemical is sticky and can be easily spread to people, animals, and objects. You can get poison ivy dermatitis by:  Having direct contact with a poison ivy plant.  Touching animals, other people, or objects that have come in contact with poison ivy and have the chemical on them.  What increases the risk? This condition is more likely to develop in:  People who are outdoors often.  People who go outdoors without wearing protective clothing, such as closed shoes, long pants, and a long-sleeved shirt.  What are the signs or symptoms? Symptoms of this condition include:  Redness and itching.  A rash that often includes bumps and blisters. The rash usually appears 48 hours after exposure.  Swelling. This may occur if the reaction is more severe.  Symptoms usually last for 1-2 weeks. However, the first time you develop this condition, symptoms may last 3-4 weeks. How is this diagnosed? This condition may be diagnosed based on your symptoms and a physical exam. Your health care provider may also ask you about any recent outdoor activity. How is this treated? Treatment for this condition will vary depending on how severe it  is. Treatment may include:  Hydrocortisone creams or calamine lotions to relieve itching.  Oatmeal baths to soothe the skin.  Over-the-counter antihistamine tablets.  Oral steroid medicine for more severe outbreaks.  Follow these instructions at home:  Take or apply over-the-counter and prescription medicines only as told by your health care provider.  Wash exposed skin as soon as possible with soap and cold water.  Use hydrocortisone creams or calamine lotion as needed to soothe the skin and relieve itching.  Take oatmeal baths as needed. Use colloidal oatmeal. You can get this at your local pharmacy or grocery store. Follow the instructions on the packaging.  Do not scratch or rub your skin.  While you have the rash, wash clothes right after you wear them. How is this prevented?  Learn to identify the poison ivy plant and avoid contact with the plant. This plant can be recognized by the number of leaves. Generally, poison ivy has three leaves with flowering branches on a single stem. The leaves are typically glossy, and they have jagged edges that come to a point at the front.  If you have been exposed to poison ivy, thoroughly wash with soap and water right away. You have about 30 minutes to remove the plant resin before it will cause the rash. Be sure to wash under your fingernails because any plant resin there will continue to spread the rash.  When hiking or camping, wear clothes that will help you to avoid exposure  on the skin. This includes long pants, a long-sleeved shirt, tall socks, and hiking boots. You can also apply preventive lotion to your skin to help limit exposure.  If you suspect that your clothes or outdoor gear came in contact with poison ivy, rinse them off outside with a garden hose before you bring them inside your house. Contact a health care provider if:  You have open sores in the rash area.  You have more redness, swelling, or pain in the affected  area.  You have redness that spreads beyond the rash area.  You have fluid, blood, or pus coming from the affected area.  You have a fever.  You have a rash over a large area of your body.  You have a rash on your eyes, mouth, or genitals.  Your rash does not improve after a few days. Get help right away if:  Your face swells or your eyes swell shut.  You have trouble breathing.  You have trouble swallowing. This information is not intended to replace advice given to you by your health care provider. Make sure you discuss any questions you have with your health care provider. Document Released: 03/14/2000 Document Revised: 08/23/2015 Document Reviewed: 08/23/2014 Elsevier Interactive Patient Education  2018 [employer].   IF you received an x-ray today, you will receive an invoice from [facility]. Please contact [facility]iology at [phone] with questions or concerns regarding your invoice.   IF you received labwork today, you will receive an invoice from [place]. Please contact [facility] at [phone] with questions or concerns regarding your invoice.   Our billing staff will not be able to assist you with questions regarding bills from these companies.  You will be contacted with the lab results as soon as they are available. The fastest way to get your results is to activate your My Chart account. Instructions are located on the last page of this paperwork. If you have not heard from [place] regarding the results in 2 weeks, please contact this office.

## 2017-09-14 DIAGNOSIS — L820 Inflamed seborrheic keratosis: Secondary | ICD-10-CM | POA: Diagnosis not present

## 2017-09-14 DIAGNOSIS — Z1283 Encounter for screening for malignant neoplasm of skin: Secondary | ICD-10-CM | POA: Diagnosis not present

## 2017-09-14 DIAGNOSIS — B351 Tinea unguium: Secondary | ICD-10-CM | POA: Diagnosis not present

## 2018-01-14 DIAGNOSIS — N3946 Mixed incontinence: Secondary | ICD-10-CM | POA: Diagnosis not present

## 2018-01-14 DIAGNOSIS — R350 Frequency of micturition: Secondary | ICD-10-CM | POA: Diagnosis not present

## 2018-03-25 ENCOUNTER — Ambulatory Visit: Payer: BLUE CROSS/BLUE SHIELD | Admitting: Physician Assistant

## 2018-03-25 ENCOUNTER — Encounter: Payer: Self-pay | Admitting: Physician Assistant

## 2018-03-25 ENCOUNTER — Ambulatory Visit: Payer: Self-pay

## 2018-03-25 ENCOUNTER — Other Ambulatory Visit: Payer: Self-pay

## 2018-03-25 VITALS — BP 119/68 | HR 68 | Temp 97.7°F | Ht 65.0 in | Wt 155.8 lb

## 2018-03-25 DIAGNOSIS — R42 Dizziness and giddiness: Secondary | ICD-10-CM

## 2018-03-25 DIAGNOSIS — R112 Nausea with vomiting, unspecified: Secondary | ICD-10-CM | POA: Diagnosis not present

## 2018-03-25 LAB — POCT CBC
Granulocyte percent: 65.8 % (ref 37–80)
HCT, POC: 43.9 % — AB (ref 29–41)
Hemoglobin: 14.7 g/dL — AB (ref 11–14.6)
Lymph, poc: 1.6 (ref 0.6–3.4)
MCH, POC: 28.3 pg (ref 27–31.2)
MCHC: 33.4 g/dL (ref 31.8–35.4)
MCV: 84.8 fL (ref 76–111)
MID (cbc): 0.3 (ref 0–0.9)
MPV: 7.5 fL (ref 0–99.8)
POC Granulocyte: 3.7 (ref 2–6.9)
POC LYMPH PERCENT: 29 % (ref 10–50)
POC MID %: 5.2 %M (ref 0–12)
Platelet Count, POC: 346 10*3/uL (ref 142–424)
RBC: 5.18 M/uL (ref 4.04–5.48)
RDW, POC: 13.7 %
WBC: 5.6 10*3/uL (ref 4.6–10.2)

## 2018-03-25 LAB — POCT URINALYSIS DIP (MANUAL ENTRY)
Bilirubin, UA: NEGATIVE
Blood, UA: NEGATIVE
Glucose, UA: NEGATIVE mg/dL
Ketones, POC UA: NEGATIVE mg/dL
Leukocytes, UA: NEGATIVE
Nitrite, UA: NEGATIVE
Protein Ur, POC: NEGATIVE mg/dL
Spec Grav, UA: 1.015 (ref 1.010–1.025)
Urobilinogen, UA: 0.2 E.U./dL
pH, UA: 7 (ref 5.0–8.0)

## 2018-03-25 LAB — POC INFLUENZA A&B (BINAX/QUICKVUE)
Influenza A, POC: NEGATIVE
Influenza B, POC: NEGATIVE

## 2018-03-25 NOTE — Progress Notes (Signed)
Megan Hooper  MRN: [id number] DOB: Aug 21, 1955  PCP: [name], MD  Subjective:  Pt is a 62 year old female who presents to clinic for dizziness, nausea, vomiting and chills.  Felt dizzy and "woozy" when she woke up this morning. Vomited after eating a banana.  Dizziness is worse when she bends forward or with any movement.  She ate eggs, sausage for breakfast yesterday and a light dinner last night. No known sick contacts.  Denies shob, chest pain, palpitations, urinary symptoms, spotting, fever, AMS, back pain, constipation.   She did have her flu shot.   Review of Systems  Constitutional: Positive for chills.  Gastrointestinal: Positive for nausea and vomiting.  Neurological: Positive for dizziness.    Patient Active Problem List   Diagnosis Date Noted  . Osteopenia 12/20/2015  . Allergy to cats 12/19/2015  . Asthma, mild intermittent 12/19/2015  . Hyperlipidemia 09/19/2014  . Hot flashes 09/19/2014    Current Outpatient Medications on File Prior to Visit  Medication Sig Dispense Refill  . albuterol (PROVENTIL HFA;VENTOLIN HFA) 108 (90 Base) MCG/ACT inhaler Inhale 1-2 puffs into the lungs every 4 (four) hours as needed for wheezing or shortness of breath. 18 g 0  . mirabegron ER (MYRBETRIQ) 50 MG TB24 tablet Take 50 mg by mouth daily.    . [DISCONTINUED] DITROPAN XL 5 MG 24 hr tablet TAKE 1 TABLET ONCE DAILY. 90 tablet PRN   No current facility-administered medications on file prior to visit.     Allergies  Allergen Reactions  . Codeine      Objective:  BP 119/68 (BP Location: Right Arm, Patient Position: Sitting, Cuff Size: Normal)   Pulse 68   Temp 97.7 F (36.5 C) (Oral)   Ht _0  (1.651 m)   Wt 155 lb 12.8 oz (70.7 kg)   SpO2 97%   BMI 25.93 kg/m  Orthostatic VS for the past 24 hrs:  BP- Lying Pulse- Lying BP- Standing at 0 minutes Pulse- Standing at 0 minutes  03/25/18 1448 129/79 69 116/82 81    Physical Exam Vitals signs reviewed.    Constitutional:      General: She is not in acute distress. HENT:     Right Ear: Tympanic membrane normal.     Left Ear: Tympanic membrane normal.     Nose: Mucosal edema present. No rhinorrhea.     Right Sinus: No maxillary sinus tenderness or frontal sinus tenderness.     Left Sinus: No maxillary sinus tenderness or frontal sinus tenderness.  Cardiovascular:     Rate and Rhythm: Normal rate and regular rhythm.     Heart sounds: Normal heart sounds.  Pulmonary:     Effort: Pulmonary effort is normal. No respiratory distress.     Breath sounds: Normal breath sounds. No wheezing or rales.  Abdominal:     General: Bowel sounds are normal.     Tenderness: There is no abdominal tenderness. Negative signs include Murphy's sign and McBurney's sign.  Skin:    General: Skin is warm and dry.  Neurological:     Mental Status: She is alert and oriented to person, place, and time.     Coordination: Coordination normal.  Psychiatric:        Judgment: Judgment normal.    Results for orders placed or performed in visit on 03/25/18  POCT CBC  Result Value Ref Range   WBC 5.6 4.6 - 10.2 K/uL   Lymph, poc 1.6 0.6 - 3.4  POC LYMPH PERCENT 29.0 10 - 50 %L   MID (cbc) 0.3 0 - 0.9   POC MID % 5.2 0 - 12 %M   POC Granulocyte 3.7 2 - 6.9   Granulocyte percent 65.8 37 - 80 %G   RBC 5.18 4.04 - 5.48 M/uL   Hemoglobin 14.7 (A) 11 - 14.6 g/dL   HCT, POC 43.9 (A) 29 - 41 %   MCV 84.8 76 - 111 fL   MCH, POC 28.3 27 - 31.2 pg   MCHC 33.4 31.8 - 35.4 g/dL   RDW, POC 13.7 %   Platelet Count, POC 346 142 - 424 K/uL   MPV 7.5 0 - 99.8 fL  POCT urinalysis dipstick  Result Value Ref Range   Color, UA yellow yellow   Clarity, UA clear clear   Glucose, UA negative negative mg/dL   Bilirubin, UA negative negative   Ketones, POC UA negative negative mg/dL   Spec Grav, UA 1.015 1.010 - 1.025   Blood, UA negative negative   pH, UA 7.0 5.0 - 8.0   Protein Ur, POC negative negative mg/dL    Urobilinogen, UA 0.2 0.2 or 1.0 E.U./dL   Nitrite, UA Negative Negative   Leukocytes, UA Negative Negative  POC Influenza A&B(BINAX/QUICKVUE)  Result Value Ref Range   Influenza A, POC Negative Negative   Influenza B, POC Negative Negative    Assessment and Plan :  1. Dizziness - Pt endorses dizziness, nausea, vomiting and chills x 1 days. Vitals are stable and she is in NAD. PE is unremarkable. Rapid flu, UA and CBC are negative. CMP is pending.  She is not orthostatic. Suspect viral gastritis. Advised supportive care, hydration, small bland meals. RTC in 3 days if no improvement.  - POCT CBC - POCT urinalysis dipstick - CMP14+EGFR - Orthostatic vital signs - POC Influenza A&B(BINAX/QUICKVUE)   Mercer Pod, PA-C  [facility] 03/25/2018 3:19 PM  Please note: Portions of this report may have been transcribed using dragon voice recognition software. Every effort was made to ensure accuracy; however, inadvertent computerized transcription errors may be present.

## 2018-03-25 NOTE — Telephone Encounter (Signed)
Pt called stating that she was at work and felt dizzy and woozy. She decided that maybe she just needed to eat.  She states she ate a banana and then vomited.. She has felt hot cold and clammy. Unsure if it was fever. She has had a cold. She had a loose stool this AM. Appointment scheduled per protocol. Care advice read to patient. Pt verbalized understanding of all instructions.  Reason for Disposition . [1] MODERATE dizziness (e.g., interferes with normal activities) AND [2] has NOT been evaluated by physician for this  (Exception: dizziness caused by heat exposure, sudden standing, or poor fluid intake)  Answer Assessment - Initial Assessment Questions 1. DESCRIPTION: "Describe your dizziness."     lighted 2. LIGHTHEADED: "Do you feel lightheaded?" (e.g., somewhat faint, woozy, weak upon standing)    Somewhat faint 3. VERTIGO: "Do you feel like either you or the room is spinning or tilting?" (i.e. vertigo)     no 4. SEVERITY: "How bad is it?"  "Do you feel like you are going to faint?" "Can you stand and walk?"   - MILD - walking normally   - MODERATE - interferes with normal activities (e.g., work, school)    - SEVERE - unable to stand, requires support to walk, feels like passing out now.      moderate 5. ONSET:  "When did the dizziness begin?"     today 6. AGGRAVATING FACTORS: "Does anything make it worse?" (e.g., standing, change in head position)     standing 7. HEART RATE: "Can you tell me your heart rate?" "How many beats in 15 seconds?"  (Note: not all patients can do this)      no 8. CAUSE: "What do you think is causing the dizziness?"     Thought she needed to eat 9. RECURRENT SYMPTOM: "Have you had dizziness before?" If so, ask: "When was the last time?" "What happened that time?"     no 10. OTHER SYMPTOMS: "Do you have any other symptoms?" (e.g., fever, chest pain, vomiting, diarrhea, bleeding)       Hot sweaty clammy and cold vomiting, loose BM this am 11. PREGNANCY: "Is  there any chance you are pregnant?" "When was your last menstrual period?"       No  Protocols used: DIZZINESS [facility]

## 2018-03-25 NOTE — Patient Instructions (Addendum)
Your blood work looks great so far!  No sign of urinary tract infection.  Your blood counts are great. White blood cell count is within normal limits.  You do not have the flu.   Stay well hydrated. Try to drink 1-2 liters of fluids/day. Eat a few small meals a day. Stick to a bland diet - nothing spicy, no citrus, no tomato-based foods.  If you are not improving over the next 2-3 days, please come back and see [place].   Follow these instructions at home: Eating and drinking   Drink enough fluid to keep your urine pale yellow.  Eat a healthy diet, and follow instructions from your health care provider about eating or drinking restrictions. A healthy diet includes: ? Fresh fruits and vegetables. ? Whole grains. ? Lean meats. ? Low-fat dairy products.  Eat extra salt only as directed. Do not add extra salt to your diet unless your health care provider told you to do that.  Eat frequent, small meals.  Avoid standing up suddenly after eating. Medicines  Take over-the-counter and prescription medicines only as told by your health care provider. ? Follow instructions from your health care provider about changing the dosage of your current medicines, if this applies. ? Do not stop or adjust any of your medicines on your own. General instructions   Wear compression stockings as told by your health care provider.  Get up slowly from lying down or sitting positions. This gives your blood pressure a chance to adjust.  Avoid hot showers and excessive heat as directed by your health care provider.  Return to your normal activities as told by your health care provider. Ask your health care provider what activities are safe for you.  Do not use any products that contain nicotine or tobacco, such as cigarettes, e-cigarettes, and chewing tobacco. If you need help quitting, ask your health care provider.  Keep all follow-up visits as told by your health care provider. This is  important. Contact a health care provider if you:  Vomit.  Have diarrhea.  Have a fever for more than 2-3 days.  Feel more thirsty than usual.  Feel weak and tired. Get help right away if you:  Have chest pain.  Have a fast or irregular heartbeat.  Develop numbness in any part of your body.  Cannot move your arms or your legs.  Have trouble speaking.  Become sweaty or feel light-headed.  Faint.  Feel short of breath.  Have trouble staying awake.  Feel confused.    If you have lab work done today you will be contacted with your lab results within the next 2 weeks.  If you have not heard from [place] then please contact us. The fastest way to get your results is to register for My Chart.  Thank you for coming in today. I hope you feel we met your needs.  Feel free to call PCP if you have any questions or further requests.  Please consider signing up for MyChart if you do not already have it, as this is a great way to communicate with me.  Best,  Whitney McVey, PA-C   IF you received an x-ray today, you will receive an invoice from [facility]. Please contact [facility] at [phone] with questions or concerns regarding your invoice.   IF you received labwork today, you will receive an invoice from [facility]. Please contact [facility] at [phone] with questions or concerns regarding your invoice.   Our billing staff will  not be able to assist you with questions regarding bills from these companies.  You will be contacted with the lab results as soon as they are available. The fastest way to get your results is to activate your My Chart account. Instructions are located on the last page of this paperwork. If you have not heard from [place] regarding the results in 2 weeks, please contact this office.

## 2018-03-26 LAB — CMP14+EGFR
ALT: 28 IU/L (ref 0–32)
AST: 23 IU/L (ref 0–40)
Albumin/Globulin Ratio: 2 (ref 1.2–2.2)
Albumin: 4.7 g/dL (ref 3.6–4.8)
Alkaline Phosphatase: 94 IU/L (ref 39–117)
BUN/Creatinine Ratio: 15 (ref 12–28)
BUN: 9 mg/dL (ref 8–27)
Bilirubin Total: 0.3 mg/dL (ref 0.0–1.2)
CO2: 22 mmol/L (ref 20–29)
Calcium: 10 mg/dL (ref 8.7–10.3)
Chloride: 101 mmol/L (ref 96–106)
Creatinine, Ser: 0.62 mg/dL (ref 0.57–1.00)
GFR calc Af Amer: 113 mL/min/{1.73_m2} (ref >59)
GFR calc non Af Amer: 98 mL/min/{1.73_m2} (ref >59)
Globulin, Total: 2.3 g/dL (ref 1.5–4.5)
Glucose: 87 mg/dL (ref 65–99)
Potassium: 4.1 mmol/L (ref 3.5–5.2)
Sodium: 142 mmol/L (ref 134–144)
Total Protein: 7 g/dL (ref 6.0–8.5)

## 2018-03-31 HISTORY — PX: CATARACT EXTRACTION: SUR2

## 2018-07-12 ENCOUNTER — Ambulatory Visit (INDEPENDENT_AMBULATORY_CARE_PROVIDER_SITE_OTHER): Payer: BLUE CROSS/BLUE SHIELD

## 2018-07-12 ENCOUNTER — Ambulatory Visit
Admission: EM | Admit: 2018-07-12 | Discharge: 2018-07-12 | Disposition: A | Discharge status: Home / Self Care | Payer: BLUE CROSS/BLUE SHIELD | Attending: Family Medicine | Admitting: Family Medicine

## 2018-07-12 ENCOUNTER — Telehealth: Payer: Self-pay | Admitting: Family Medicine

## 2018-07-12 DIAGNOSIS — Y92821 Forest as the place of occurrence of the external cause: Secondary | ICD-10-CM

## 2018-07-12 DIAGNOSIS — S42022A Displaced fracture of shaft of left clavicle, initial encounter for closed fracture: Secondary | ICD-10-CM | POA: Diagnosis not present

## 2018-07-12 DIAGNOSIS — W19XXXA Unspecified fall, initial encounter: Secondary | ICD-10-CM | POA: Diagnosis not present

## 2018-07-12 DIAGNOSIS — S42002A Fracture of unspecified part of left clavicle, initial encounter for closed fracture: Secondary | ICD-10-CM | POA: Diagnosis not present

## 2018-07-12 DIAGNOSIS — W1781XA Fall down embankment (hill), initial encounter: Secondary | ICD-10-CM | POA: Diagnosis not present

## 2018-07-12 MED ORDER — TRAMADOL HCL 50 MG PO TABS: 50.0000 mg | ORAL_TABLET | Freq: Four times a day (QID) | ORAL | 0 refills | Status: DC | PRN

## 2018-07-12 MED ORDER — KETOROLAC TROMETHAMINE 30 MG/ML IJ SOLN
30.0000 mg | Freq: Once | INTRAMUSCULAR | Status: AC
  Administered 2018-07-12: 30 mg via INTRAMUSCULAR

## 2018-07-12 NOTE — ED Provider Notes (Signed)
[facility]    CSN: [id number] Arrival date & time: 07/12/18  1206     History   Chief Complaint Chief Complaint  Patient presents with  . Shoulder Pain    HPI Megan Hooper is a 63 y.o. female.   Pt is a 63 year old female that presents with left shoulder/clavicle pain since a fall that occurred on Saturday. Reports that she was in the woods and tripped, rolling down a hill, falling on the left side. She has been in a lot of pain and sore since. She has ROM of the shoulder but some mild swelling to the clavicle. She has been taking aleve and tylenol for the pain. This helps some. Rating pain 10/10 currently. She has heard  some crackling in the area of her shoulder. No loss of sensation, change in temperature of the extremity or color. Denies hitting head when she fell or LOC.   ROS per HPI      Past Medical History:  Diagnosis Date  . Allergy   . Asthma   . Dyslipidemia   . Leukopenia    mild  . Osteopenia     Patient Active Problem List   Diagnosis Date Noted  . Osteopenia 12/20/2015  . Allergy to cats 12/19/2015  . Asthma, mild intermittent 12/19/2015  . Hyperlipidemia 09/19/2014  . Hot flashes 09/19/2014    Past Surgical History:  Procedure Laterality Date  . ABDOMINAL HYSTERECTOMY      OB History   No obstetric history on file.      Home Medications    Prior to Admission medications   Medication Sig Start Date End Date Taking? Authorizing Provider  albuterol (PROVENTIL HFA;VENTOLIN HFA) 108 (90 Base) MCG/ACT inhaler Inhale 1-2 puffs into the lungs every 4 (four) hours as needed for wheezing or shortness of breath. 08/26/17   McVey, Gelene Mink, PA-C  mirabegron ER (MYRBETRIQ) 50 MG TB24 tablet Take 50 mg by mouth daily.    [provider]  traMADol (ULTRAM) 50 MG tablet Take 1 tablet (50 mg total) by mouth every 6 (six) hours as needed. 07/12/18   Julissa Browning A, NP  DITROPAN XL 5 MG 24 hr tablet TAKE 1 TABLET ONCE  DAILY. 01/10/12 04/23/13  [name], PA-C    Family History Family History  Problem Relation Age of Onset  . Hyperlipidemia Mother   . Cancer Father     Social History Social History   Tobacco Use  . Smoking status: Former Smoker    Packs/day: 0.50    Years: 13.00    Pack years: 6.50    Types: Cigarettes    Start date: 05/26/1978    Last attempt to quit: 10/22/1991    Years since quitting: 26.7  . Smokeless tobacco: Never Used  . Tobacco comment: quit  13 years ago  Substance Use Topics  . Alcohol use: No    Alcohol/week: 0.0 standard drinks  . Drug use: No     Allergies   Codeine   Review of Systems Review of Systems   Physical Exam Triage Vital Signs ED Triage Vitals  Enc Vitals Group     BP 07/12/18 1233 (!) 153/89     Pulse Rate 07/12/18 1233 75     Resp 07/12/18 1233 18     Temp 07/12/18 1233 97.7 F (36.5 C)     Temp Source 07/12/18 1233 Oral     SpO2 07/12/18 1233 98 %     Weight --  Height --      Head Circumference --      Peak Flow --      Pain Score 07/12/18 1234 7     Pain Loc --      Pain Edu? --      Excl. in [place]? --    No data found.  Updated Vital Signs BP (!) 153/89 (BP Location: Left Arm)   Pulse 75   Temp 97.7 F (36.5 C) (Oral)   Resp 18   SpO2 98%   Visual Acuity Right Eye Distance:   Left Eye Distance:   Bilateral Distance:    Right Eye Near:   Left Eye Near:    Bilateral Near:     Physical Exam Vitals signs and nursing note reviewed.  Constitutional:      Appearance: Normal appearance.  HENT:     Head: Normocephalic and atraumatic.     Nose: Nose normal.  Eyes:     Conjunctiva/sclera: Conjunctivae normal.  Neck:     Musculoskeletal: Normal range of motion.  Pulmonary:     Effort: Pulmonary effort is normal.  Musculoskeletal: Normal range of motion.        General: Swelling, tenderness and signs of injury present.     Comments: Left clavicular swelling. No tenting. Limited ROM of the shoulder.  Distal pulse and sensation intact. Good color and temperature of left upper extremity.   Skin:    General: Skin is warm and dry.  Neurological:     Mental Status: She is alert.  Psychiatric:        Mood and Affect: Mood normal.      UC Treatments / Results  Labs (all labs ordered are listed, but only abnormal results are displayed) Labs Reviewed - No data to display  EKG None  Radiology Dg Shoulder Left  Result Date: 07/12/2018 CLINICAL DATA:  Fall 2 days ago, with persistent left shoulder pain. EXAM: LEFT SHOULDER - 2+ VIEW COMPARISON:  None. FINDINGS: There is an acute, displaced fracture involving the distal third of the clavicle with associated foreshortening. There is an approximately 1.3 cm displaced ossicle adjacent to the main fracture site. No definitive extension to involve the acromioclavicular joint. Glenohumeral joint spaces appear preserved. Limited visualization of the adjacent thorax suggests a potential minimally displaced fracture involving the posterior aspect of the left first rib. No pneumothorax. Adjacent soft tissue swelling.  No radiopaque foreign body. IMPRESSION: 1. Acute, displaced clavicular fracture without definitive extension to the acromioclavicular joint. 2. Suspected nondisplaced fracture involving the posterior aspect of the left first rib. Electronically Signed   By: Sandi Mariscal M.D.   On: 07/12/2018 13:19    Procedures Procedures (including critical care time)  Medications Ordered in UC Medications  ketorolac (TORADOL) 30 MG/ML injection 30 mg (30 mg Intramuscular Given 07/12/18 1322)    Initial Impression / Assessment and Plan / UC Course  I have reviewed the triage vital signs and the nursing notes.  Pertinent labs & imaging results that were available during my care of the patient were reviewed by me and considered in my medical decision making (see chart for details).    Fall-   Pt with left first rib and left distal, displaced clavicle  fracture.  Toradol injection given here in clinic for pain Patient had mild relief of pain with this medication Place patient in a sling and prescribing Toradol for severe pain Rest, ice Warned about frozen shoulder.  Gave her contact for orthopedic and instructed  to call the orthopedic or her primary tomorrow There is no concern for vascular compromise Follow up as needed for continued or worsening symptoms   Final Clinical Impressions(s) / UC Diagnoses   Final diagnoses:  Closed displaced fracture of left clavicle, unspecified part of clavicle, initial encounter  Fall, initial encounter     Discharge Instructions     You have a clavicle fracture Toradol injection given here in the clinic for pain.  I sending you home with some more pain medication to take Sling given here to wear. You need to take your arm out of the sling to do shoulder ROM occasionally to prevent frozen shoulder. Apply ice to the area a few times a day for 10-15 minutes at a time.       ED Prescriptions    Medication Sig Dispense Auth. Provider   traMADol (ULTRAM) 50 MG tablet Take 1 tablet (50 mg total) by mouth every 6 (six) hours as needed. 15 tablet Loura Halt A, NP     Controlled Substance Prescriptions [place] Controlled Substance Registry consulted? Not Applicable   Orvan July, NP 07/12/18 1735

## 2018-07-12 NOTE — ED Triage Notes (Signed)
Pt states she fell and rolled down a hill while in the woods Saturday around 2p. C/o lt shoulder pain; states sore all over

## 2018-07-12 NOTE — Telephone Encounter (Signed)
Answering service call.  Patient fell Saturday 4/11 while hiking onto her left shoulder.  She reported continued full range of motion but has some shooting pain towards her neck in the left side of her shoulder but was moving neck okay and no neck pain at rest.  Was having soreness with movement and when sleeping.  Had been taking Aleve 1 every 8 hours.  She did notice a bump on the top of her shoulder but not her collarbone.  Nurse with answering service recommended urgent care eval, but she wanted to talk to me about treatments.  Declined urgent care eval as she had a concern of driving. Recommended Aleve increased to 2 tabs or 440 mg every 12 hours with food until she can be seen.  Can continue ice on and off for 10 minutes.  in office on Monday or urgent care or ER sooner if needed.  Can call friend or ride share for transportation if needed. Understanding expressed.

## 2018-07-12 NOTE — Discharge Instructions (Addendum)
You have a clavicle fracture Toradol injection given here in the clinic for pain.  I sending you home with some more pain medication to take Sling given here to wear. You need to take your arm out of the sling to do shoulder ROM occasionally to prevent frozen shoulder. Apply ice to the area a few times a day for 10-15 minutes at a time.

## 2018-07-16 ENCOUNTER — Telehealth (INDEPENDENT_AMBULATORY_CARE_PROVIDER_SITE_OTHER): Payer: BLUE CROSS/BLUE SHIELD | Admitting: Family Medicine

## 2018-07-16 ENCOUNTER — Other Ambulatory Visit: Payer: Self-pay

## 2018-07-16 DIAGNOSIS — S2232XA Fracture of one rib, left side, initial encounter for closed fracture: Secondary | ICD-10-CM | POA: Diagnosis not present

## 2018-07-16 DIAGNOSIS — S42002A Fracture of unspecified part of left clavicle, initial encounter for closed fracture: Secondary | ICD-10-CM

## 2018-07-16 MED ORDER — TRAMADOL HCL 50 MG PO TABS: 50.0000 mg | ORAL_TABLET | Freq: Four times a day (QID) | ORAL | 0 refills | Status: DC | PRN

## 2018-07-16 NOTE — Patient Instructions (Signed)
Continue sling for now.  Tramadol and Aleve as needed.  Try to take a deep breath few times per hour due to the rib fracture.  See information below on rib fractures and collarbone fractures.  Based on those injuries I would like you to meet with orthopedics and have placed a referral for sometime next week.  If any shortness of breath, lightheadedness, or any worsening symptoms, be seen right away.  Please let me know if there are questions during this time and hang in there!  Dr. [name]    Clavicle Fracture  A clavicle fracture is a break in the long bone that connects the shoulder to the chest wall (clavicle). The clavicle is also called the collarbone. A clavicle fracture is a common injury that can happen at any age. What are the causes? Common causes of a clavicle fracture include:  A hard, direct hit (blow) to the shoulder.  A car accident.  A fall, especially if you try to break your fall with an outstretched arm. What increases the risk? You are more likely to develop this condition if:  You are younger than 61 or older than 66. Most clavicle fractures happen to people who are younger than 25.  You are a female.  You play contact sports. What are the signs or symptoms? Symptoms of this condition include:  Pain.  Difficulty moving the arm.  A shoulder that drops downward and forward.  Pain when trying to lift the shoulder.  Bruising, swelling, and tenderness over the clavicle.  A grinding noise when you try to move the shoulder.  A bump over the clavicle. How is this diagnosed? This condition is diagnosed based on:  Your medical history.  A physical exam.  X-rays to determine the position of your clavicle. How is this treated? Treatment for this condition depends on the position of your clavicle after the fracture:  If the broken ends of the bone are not out of place, your health care provider may put your arm in a sling.  If the broken ends of the bone are  out of place, you may need surgery. Surgery may involve placing screws, pins, or plates to keep your clavicle stable while it heals. When your health care provider thinks your fracture has healed enough, you may have to do physical therapy to regain normal movement and build up your arm strength. Follow these instructions at home: If you have a sling:   Wear the sling as told by your health care provider. Remove it only as told by your health care provider.  Loosen the sling if your fingers tingle, become numb, or turn cold and blue.  Do not lift your arm. Keep it across your chest.  Keep the sling clean.  Ask your health care provider if you may remove the sling for bathing. ? If your sling is not waterproof, do not let it get wet. Cover the sling with a watertight covering if you take a bath or a shower while wearing it. ? If you may remove your sling when you take a bath or a shower, keep your shoulder in the same position as when the sling is on. Managing pain, stiffness, and swelling   If directed, put ice on the injured area: ? If you have a removable sling, remove it as told by your health care provider. ? Put ice in a plastic bag. ? Place a towel between your skin and the bag. ? Leave the ice on for 20  minutes, 2-3 times a day. Activity  Avoid activities that make your symptoms worse for 4-6 weeks, or as long as directed.  Ask your health care provider when it is safe for you to drive.  Do exercises as told by your health care provider. General instructions  Do not use any products that contain nicotine or tobacco, such as cigarettes and e-cigarettes. These can delay bone healing. If you need help quitting, ask your health care provider.  Take over-the-counter and prescription medicines only as told by your health care provider.  Keep all follow-up visits as told by your health care provider. This is important. Contact a health care provider if:  Your medicine is not  helping to relieve pain and swelling. Get help right away if:  Your arm is numb, cold, or pale, even when your splint is loose. Summary  The clavicle, also called the collarbone, is the long bone that connects the shoulder to the chest wall.  Treatment for this condition depends on the position of your clavicle after the fracture.  You may need to do physical therapy after your injury has healed enough.  If you have a sling, wear the sling as told by your health care provider. This information is not intended to replace advice given to you by your health care provider. Make sure you discuss any questions you have with your health care provider. Document Released: 12/25/2004 Document Revised: 02/04/2016 Document Reviewed: 02/04/2016 Elsevier Interactive Patient Education  2019 [place].  Rib Fracture  A rib fracture is a break or crack in one of the bones of the ribs. The ribs are long, curved bones that wrap around your chest and attach to your spine and your breastbone. The ribs protect your heart, lungs, and other organs in the chest. A broken or cracked rib is often painful but is not usually serious. Most rib fractures heal on their own over time. However, rib fractures can be more serious if multiple ribs are broken or if broken ribs move out of place and push against other structures or organs. What are the causes? This condition is caused by:  Repetitive movements with high force, such as pitching a baseball or having severe coughing spells.  A direct blow to the chest, such as a sports injury, a car accident, or a fall.  Cancer that has spread to the bones, which can weaken bones and cause them to break. What are the signs or symptoms? Symptoms of this condition include:  Pain when you breathe in or cough.  Pain when someone presses on the injured area.  Feeling short of breath. How is this diagnosed? This condition is diagnosed with a physical exam and medical  history. Imaging tests may also be done, such as:  Chest X-ray.  CT scan.  MRI.  Bone scan.  Chest ultrasound. How is this treated? Treatment for this condition depends on the severity of the fracture. Most rib fractures usually heal on their own in 1-3 months. Sometimes healing takes longer if there is a cough that does not stop or if there are other activities that make the injury worse (aggravating factors). While you heal, you will be given medicines to control the pain. You will also be taught deep breathing exercises. Severe injuries may require hospitalization or surgery. Follow these instructions at home: Managing pain, stiffness, and swelling  If directed, apply ice to the injured area. ? Put ice in a plastic bag. ? Place a towel between your skin and  the bag. ? Leave the ice on for 20 minutes, 2-3 times a day.  Take over-the-counter and prescription medicines only as told by your health care provider. Activity  Avoid a lot of activity and any activities or movements that cause pain. Be careful during activities and avoid bumping the injured rib.  Slowly increase your activity as told by your health care provider. General instructions  Do deep breathing exercises as told by your health care provider. This helps prevent pneumonia, which is a common complication of a broken rib. Your health care provider may instruct you to: ? Take deep breaths several times a day. ? Try to cough several times a day, holding a pillow against the injured area. ? Use a device called incentive spirometer to practice deep breathing several times a day.  Drink enough fluid to keep your urine pale yellow.  Do not wear a rib belt or binder. These restrict breathing, which can lead to pneumonia.  Keep all follow-up visits as told by your health care provider. This is important. Contact a health care provider if:  You have a fever. Get help right away if:  You have difficulty breathing or  you are short of breath.  You develop a cough that does not stop, or you cough up thick or bloody sputum.  You have nausea, vomiting, or pain in your abdomen.  Your pain gets worse and medicine does not help. Summary  A rib fracture is a break or crack in one of the bones of the ribs.  A broken or cracked rib is often painful but is not usually serious.  Most rib fractures heal on their own over time.  Treatment for this condition depends on the severity of the fracture.  Avoid a lot of activity and any activities or movements that cause pain. This information is not intended to replace advice given to you by your health care provider. Make sure you discuss any questions you have with your health care provider. Document Released: 03/17/2005 Document Revised: 06/16/2016 Document Reviewed: 06/16/2016 Elsevier Interactive Patient Education  2019 [employer].

## 2018-07-16 NOTE — Progress Notes (Signed)
Virtual Visit via Telephone Note  I connected with Megan Hooper on 07/16/18 at 8:47 AM by telephone and verified that I am speaking with the correct person using two identifiers.   I discussed the limitations, risks, security and privacy concerns of performing an evaluation and management service by telephone and the availability of in person appointments. I also discussed with the patient that there may be a patient responsible charge related to this service. The patient expressed understanding and agreed to proceed, consent obtained  Chief complaint:  Clavicle and left 1st rib fx.   History of Present Illness: Megan Hooper is a 63 y.o. female  Following up of left clavicle and suspected first rib fracture.  I spoke to her on the phone April 12 after contacting answering service regarding shoulder pain and a fall the day prior while hiking. Fell and rolled down 20 foot hill.  Was encouraged to seek care at that time through urgent care.  Plan to follow-up with me the following day but was seen April 13 at [facility] urgent care.  She was diagnosed with an acute displaced clavicular fracture without definite extension to the [facility] joint.  Also suspected nondisplaced left first rib fracture. Treated with Toradol injection, placed in a sling, tramadol prescribed for pain, 50 mg, #15.  Recommended follow-up with orthopedics, Dr. Alphonzo Severance.   Has not scheduled ortho follow up - not sure was needed.  Tramadol 3 times per day, with alleve 3 times per day. Has been managing ok, but still some breakthrough pain.  No dyspnea.  No wounds, no reported tenting of skin.  Sore to sneeze or hiccup.   Works as a Surveyor, [employer] - is taking care of infant - out of work at this time.    Dg Shoulder Left  Result Date: 07/12/2018 CLINICAL DATA:  Fall 2 days ago, with persistent left shoulder pain. EXAM: LEFT SHOULDER - 2+ VIEW COMPARISON:  None. FINDINGS: There is an acute, displaced fracture involving the  distal third of the clavicle with associated foreshortening. There is an approximately 1.3 cm displaced ossicle adjacent to the main fracture site. No definitive extension to involve the acromioclavicular joint. Glenohumeral joint spaces appear preserved. Limited visualization of the adjacent thorax suggests a potential minimally displaced fracture involving the posterior aspect of the left first rib. No pneumothorax. Adjacent soft tissue swelling.  No radiopaque foreign body. IMPRESSION: 1. Acute, displaced clavicular fracture without definitive extension to the acromioclavicular joint. 2. Suspected nondisplaced fracture involving the posterior aspect of the left first rib. Electronically Signed   By: Sandi Mariscal M.D.   On: 07/12/2018 13:19       Patient Active Problem List   Diagnosis Date Noted  . Osteopenia 12/20/2015  . Allergy to cats 12/19/2015  . Asthma, mild intermittent 12/19/2015  . Hyperlipidemia 09/19/2014  . Hot flashes 09/19/2014   Past Medical History:  Diagnosis Date  . Allergy   . Asthma   . Dyslipidemia   . Leukopenia    mild  . Osteopenia    Past Surgical History:  Procedure Laterality Date  . ABDOMINAL HYSTERECTOMY     Allergies  Allergen Reactions  . Codeine    Prior to Admission medications   Medication Sig Start Date End Date Taking? Authorizing Provider  albuterol (PROVENTIL HFA;VENTOLIN HFA) 108 (90 Base) MCG/ACT inhaler Inhale 1-2 puffs into the lungs every 4 (four) hours as needed for wheezing or shortness of breath. 08/26/17  Yes McVey, Gelene Mink, PA-C  mirabegron ER (MYRBETRIQ) 50 MG TB24 tablet Take 50 mg by mouth daily.   Yes [provider]  naproxen sodium (ALEVE) 220 MG tablet Take 220 mg by mouth.   Yes [provider]  traMADol (ULTRAM) 50 MG tablet Take 1 tablet (50 mg total) by mouth every 6 (six) hours as needed. 07/12/18  Yes Bast, Traci A, NP  DITROPAN XL 5 MG 24 hr tablet TAKE 1 TABLET ONCE DAILY. 01/10/12 04/23/13   [name], PA-C   Social History   Socioeconomic History  . Marital status: Divorced    Spouse name: Not on file  . Number of children: Not on file  . Years of education: Not on file  . Highest education level: Not on file  Occupational History  . Occupation: Pharmacist, hospital  Social Needs  . Financial resource strain: Not on file  . Food insecurity:    Worry: Not on file    Inability: Not on file  . Transportation needs:    Medical: Not on file    Non-medical: Not on file  Tobacco Use  . Smoking status: Former Smoker    Packs/day: 0.50    Years: 13.00    Pack years: 6.50    Types: Cigarettes    Start date: 05/26/1978    Last attempt to quit: 10/22/1991    Years since quitting: 26.7  . Smokeless tobacco: Never Used  . Tobacco comment: quit  13 years ago  Substance and Sexual Activity  . Alcohol use: No    Alcohol/week: 0.0 standard drinks  . Drug use: No  . Sexual activity: Never  Lifestyle  . Physical activity:    Days per week: Not on file    Minutes per session: Not on file  . Stress: Not on file  Relationships  . Social connections:    Talks on phone: Not on file    Gets together: Not on file    Attends religious service: Not on file    Active member of club or organization: Not on file    Attends meetings of clubs or organizations: Not on file    Relationship status: Not on file  . Intimate partner violence:    Fear of current or ex partner: Not on file    Emotionally abused: Not on file    Physically abused: Not on file    Forced sexual activity: Not on file  Other Topics Concern  . Not on file  Social History Narrative  . Not on file     Observations/Objective: Speaking in full sentences on phone, no apparent distress.  No respiratory distress.  Assessment and Plan: Closed displaced fracture of left clavicle, unspecified part of clavicle, initial encounter - Plan: traMADol (ULTRAM) 50 MG tablet, Ambulatory referral to Orthopedic Surgery  Closed  fracture of one rib of left side, initial encounter - Plan: traMADol (ULTRAM) 50 MG tablet, Ambulatory referral to Orthopedic Surgery Combine first rib fracture and clavicle fracture, appears to be near middle to distal third area.  Displaced.  Denies any new respiratory symptoms.  Pain has been moderately controlled with current regimen of tramadol plus Aleve.   -  Refilled tramadol.  Continue sling.  Incentive spirometry/taking deep breaths throughout the day discussed with rib fracture.  Referral placed to orthopedics with ER/RTC precautions discussed for any new/worsening symptoms.  Understand expressed  Follow Up Instructions:  Prn - referred to ortho.     Patient Instructions  Continue sling for now.  Tramadol and Aleve  as needed.  Try to take a deep breath few times per hour due to the rib fracture.  See information below on rib fractures and collarbone fractures.  Based on those injuries I would like you to meet with orthopedics and have placed a referral for sometime next week.  If any shortness of breath, lightheadedness, or any worsening symptoms, be seen right away.  Please let me know if there are questions during this time and hang in there!  Dr. [name]    Clavicle Fracture  A clavicle fracture is a break in the long bone that connects the shoulder to the chest wall (clavicle). The clavicle is also called the collarbone. A clavicle fracture is a common injury that can happen at any age. What are the causes? Common causes of a clavicle fracture include:  A hard, direct hit (blow) to the shoulder.  A car accident.  A fall, especially if you try to break your fall with an outstretched arm. What increases the risk? You are more likely to develop this condition if:  You are younger than 24 or older than 22. Most clavicle fractures happen to people who are younger than 25.  You are a female.  You play contact sports. What are the signs or symptoms? Symptoms of this condition  include:  Pain.  Difficulty moving the arm.  A shoulder that drops downward and forward.  Pain when trying to lift the shoulder.  Bruising, swelling, and tenderness over the clavicle.  A grinding noise when you try to move the shoulder.  A bump over the clavicle. How is this diagnosed? This condition is diagnosed based on:  Your medical history.  A physical exam.  X-rays to determine the position of your clavicle. How is this treated? Treatment for this condition depends on the position of your clavicle after the fracture:  If the broken ends of the bone are not out of place, your health care provider may put your arm in a sling.  If the broken ends of the bone are out of place, you may need surgery. Surgery may involve placing screws, pins, or plates to keep your clavicle stable while it heals. When your health care provider thinks your fracture has healed enough, you may have to do physical therapy to regain normal movement and build up your arm strength. Follow these instructions at home: If you have a sling:   Wear the sling as told by your health care provider. Remove it only as told by your health care provider.  Loosen the sling if your fingers tingle, become numb, or turn cold and blue.  Do not lift your arm. Keep it across your chest.  Keep the sling clean.  Ask your health care provider if you may remove the sling for bathing. ? If your sling is not waterproof, do not let it get wet. Cover the sling with a watertight covering if you take a bath or a shower while wearing it. ? If you may remove your sling when you take a bath or a shower, keep your shoulder in the same position as when the sling is on. Managing pain, stiffness, and swelling   If directed, put ice on the injured area: ? If you have a removable sling, remove it as told by your health care provider. ? Put ice in a plastic bag. ? Place a towel between your skin and the bag. ? Leave the ice on  for 20 minutes, 2-3 times a day. Activity  Avoid activities that make your symptoms worse for 4-6 weeks, or as long as directed.  Ask your health care provider when it is safe for you to drive.  Do exercises as told by your health care provider. General instructions  Do not use any products that contain nicotine or tobacco, such as cigarettes and e-cigarettes. These can delay bone healing. If you need help quitting, ask your health care provider.  Take over-the-counter and prescription medicines only as told by your health care provider.  Keep all follow-up visits as told by your health care provider. This is important. Contact a health care provider if:  Your medicine is not helping to relieve pain and swelling. Get help right away if:  Your arm is numb, cold, or pale, even when your splint is loose. Summary  The clavicle, also called the collarbone, is the long bone that connects the shoulder to the chest wall.  Treatment for this condition depends on the position of your clavicle after the fracture.  You may need to do physical therapy after your injury has healed enough.  If you have a sling, wear the sling as told by your health care provider. This information is not intended to replace advice given to you by your health care provider. Make sure you discuss any questions you have with your health care provider. Document Released: 12/25/2004 Document Revised: 02/04/2016 Document Reviewed: 02/04/2016 Elsevier Interactive Patient Education  2019 [employer].  Rib Fracture  A rib fracture is a break or crack in one of the bones of the ribs. The ribs are long, curved bones that wrap around your chest and attach to your spine and your breastbone. The ribs protect your heart, lungs, and other organs in the chest. A broken or cracked rib is often painful but is not usually serious. Most rib fractures heal on their own over time. However, rib fractures can be more serious if  multiple ribs are broken or if broken ribs move out of place and push against other structures or organs. What are the causes? This condition is caused by:  Repetitive movements with high force, such as pitching a baseball or having severe coughing spells.  A direct blow to the chest, such as a sports injury, a car accident, or a fall.  Cancer that has spread to the bones, which can weaken bones and cause them to break. What are the signs or symptoms? Symptoms of this condition include:  Pain when you breathe in or cough.  Pain when someone presses on the injured area.  Feeling short of breath. How is this diagnosed? This condition is diagnosed with a physical exam and medical history. Imaging tests may also be done, such as:  Chest X-ray.  CT scan.  MRI.  Bone scan.  Chest ultrasound. How is this treated? Treatment for this condition depends on the severity of the fracture. Most rib fractures usually heal on their own in 1-3 months. Sometimes healing takes longer if there is a cough that does not stop or if there are other activities that make the injury worse (aggravating factors). While you heal, you will be given medicines to control the pain. You will also be taught deep breathing exercises. Severe injuries may require hospitalization or surgery. Follow these instructions at home: Managing pain, stiffness, and swelling  If directed, apply ice to the injured area. ? Put ice in a plastic bag. ? Place a towel between your skin and the bag. ? Leave the ice on for  20 minutes, 2-3 times a day.  Take over-the-counter and prescription medicines only as told by your health care provider. Activity  Avoid a lot of activity and any activities or movements that cause pain. Be careful during activities and avoid bumping the injured rib.  Slowly increase your activity as told by your health care provider. General instructions  Do deep breathing exercises as told by your health  care provider. This helps prevent pneumonia, which is a common complication of a broken rib. Your health care provider may instruct you to: ? Take deep breaths several times a day. ? Try to cough several times a day, holding a pillow against the injured area. ? Use a device called incentive spirometer to practice deep breathing several times a day.  Drink enough fluid to keep your urine pale yellow.  Do not wear a rib belt or binder. These restrict breathing, which can lead to pneumonia.  Keep all follow-up visits as told by your health care provider. This is important. Contact a health care provider if:  You have a fever. Get help right away if:  You have difficulty breathing or you are short of breath.  You develop a cough that does not stop, or you cough up thick or bloody sputum.  You have nausea, vomiting, or pain in your abdomen.  Your pain gets worse and medicine does not help. Summary  A rib fracture is a break or crack in one of the bones of the ribs.  A broken or cracked rib is often painful but is not usually serious.  Most rib fractures heal on their own over time.  Treatment for this condition depends on the severity of the fracture.  Avoid a lot of activity and any activities or movements that cause pain. This information is not intended to replace advice given to you by your health care provider. Make sure you discuss any questions you have with your health care provider. Document Released: 03/17/2005 Document Revised: 06/16/2016 Document Reviewed: 06/16/2016 Elsevier Interactive Patient Education  2019 [employer].   I discussed the assessment and treatment plan with the patient. The patient was provided an opportunity to ask questions and all were answered. The patient agreed with the plan and demonstrated an understanding of the instructions.   The patient was advised to call back or seek an in-person evaluation if the symptoms worsen or if the condition  fails to improve as anticipated.  I provided 11 minutes of non-face-to-face time during this encounter.  Signed,   [name], MD [facility].  07/16/18

## 2018-07-16 NOTE — Progress Notes (Signed)
Pt calling for follow up of fx rib and broken clavicle. ED follow up   Medication refill: Tramadol

## 2018-07-22 ENCOUNTER — Ambulatory Visit (INDEPENDENT_AMBULATORY_CARE_PROVIDER_SITE_OTHER): Payer: Self-pay

## 2018-07-22 ENCOUNTER — Ambulatory Visit (INDEPENDENT_AMBULATORY_CARE_PROVIDER_SITE_OTHER): Payer: BLUE CROSS/BLUE SHIELD | Admitting: Orthopedic Surgery

## 2018-07-22 ENCOUNTER — Encounter (INDEPENDENT_AMBULATORY_CARE_PROVIDER_SITE_OTHER): Payer: Self-pay | Admitting: Orthopedic Surgery

## 2018-07-22 ENCOUNTER — Other Ambulatory Visit: Payer: Self-pay

## 2018-07-22 DIAGNOSIS — S42002A Fracture of unspecified part of left clavicle, initial encounter for closed fracture: Secondary | ICD-10-CM | POA: Diagnosis not present

## 2018-07-22 DIAGNOSIS — S2232XA Fracture of one rib, left side, initial encounter for closed fracture: Secondary | ICD-10-CM

## 2018-07-22 DIAGNOSIS — S4992XA Unspecified injury of left shoulder and upper arm, initial encounter: Secondary | ICD-10-CM | POA: Diagnosis not present

## 2018-07-22 MED ORDER — IBUPROFEN 800 MG PO TABS: 800.0000 mg | ORAL_TABLET | Freq: Two times a day (BID) | ORAL | 0 refills | Status: AC | PRN

## 2018-07-22 MED ORDER — TRAMADOL HCL 50 MG PO TABS: 50.0000 mg | ORAL_TABLET | Freq: Three times a day (TID) | ORAL | 0 refills | Status: DC | PRN

## 2018-07-22 NOTE — Progress Notes (Signed)
Office Visit Note   Patient: Megan Hooper           Date of Birth: 1955/08/25           MRN: [id number] Visit Date: 07/22/2018 Requested by: [name], MD [address] [place], [place] [address] PCP: [name], MD  Subjective: Chief Complaint  Patient presents with  . Clavicle Injury    HPI: Patient presents with left shoulder pain.  She had a stumble and fall while in the woods about a week ago.  She has had improvement in her pain.  She works at [employer] and also does nanny work for a Engineer, drilling.  She does have to use her arm for that type of work but she is right-hand dominant.  She does have a history of osteopenia diagnosis.  She denies any other orthopedic complaints.              ROS: All systems reviewed are negative as they relate to the chief complaint within the history of present illness.  Patient denies  fevers or chills.   Assessment & Plan: Visit Diagnoses:  1. Injury of left clavicle, initial encounter   2. Closed displaced fracture of left clavicle, unspecified part of clavicle, initial encounter   3. Closed fracture of one rib of left side, initial encounter     Plan: Impression is displaced fracture left clavicle with fairly high chance of nonunion based on amount of displacement currently.  There is also significant motion of the fracture fragments at this time.  Plan is open reduction internal fixation with plating of the clavicle.  Risk and benefits are discussed including not limited to infection nerve vessel damage area of numbness in the skin below the incision as well as potential need for hardware removal.  I also discussed with her nonoperative treatment for this fracture which would include about a 30% chance of nonunion which may be even higher based on the amount of displacement in this patient.  Patient understands risk benefits and wishes to proceed.  All questions answered  Follow-Up Instructions: No follow-ups on file.    Orders:  Orders Placed This Encounter  Procedures  . XR Clavicle Left   Meds ordered this encounter  Medications  . traMADol (ULTRAM) 50 MG tablet    Sig: Take 1 tablet (50 mg total) by mouth 3 (three) times daily as needed.    Dispense:  30 tablet    Refill:  0  . ibuprofen (ADVIL) 800 MG tablet    Sig: Take 1 tablet (800 mg total) by mouth 2 (two) times daily as needed.    Dispense:  60 tablet    Refill:  0      Procedures: No procedures performed   Clinical Data: No additional findings.  Objective: Vital Signs: There were no vitals taken for this visit.  Physical Exam:   Constitutional: Patient appears well-developed HEENT:  Head: Normocephalic Eyes:EOM are normal Neck: Normal range of motion Cardiovascular: Normal rate Pulmonary/chest: Effort normal Neurologic: Patient is alert Skin: Skin is warm Psychiatric: Patient has normal mood and affect    Ortho Exam: Ortho exam demonstrates motion at the fracture site of the clavicle.  Motor sensory function to the hand is intact.  Radial pulses intact.  Patient has some shortening of the shoulder girdle on the left compared to the right.  Resolving ecchymosis is noted.  Specialty Comments:  No specialty comments available.  Imaging: Xr Clavicle Left  Result Date:  07/22/2018 AP left shoulder reviewed.  Comminuted midshaft clavicle fracture is present with displacement of at least 1 shaft width.  Multiple fragments also present around the fracture site.  The shoulder is located.  No other abnormalities present.    PMFS History: Patient Active Problem List   Diagnosis Date Noted  . Osteopenia 12/20/2015  . Allergy to cats 12/19/2015  . Asthma, mild intermittent 12/19/2015  . Hyperlipidemia 09/19/2014  . Hot flashes 09/19/2014   Past Medical History:  Diagnosis Date  . Allergy   . Asthma   . Dyslipidemia   . Leukopenia    mild  . Osteopenia     Family History  Problem Relation Age of Onset  .  Hyperlipidemia Mother   . Cancer Father     Past Surgical History:  Procedure Laterality Date  . ABDOMINAL HYSTERECTOMY     Social History   Occupational History  . Occupation: Pharmacist, hospital  Tobacco Use  . Smoking status: Former Smoker    Packs/day: 0.50    Years: 13.00    Pack years: 6.50    Types: Cigarettes    Start date: 05/26/1978    Last attempt to quit: 10/22/1991    Years since quitting: 26.7  . Smokeless tobacco: Never Used  . Tobacco comment: quit  13 years ago  Substance and Sexual Activity  . Alcohol use: No    Alcohol/week: 0.0 standard drinks  . Drug use: No  . Sexual activity: Never

## 2018-07-23 NOTE — Pre-Procedure Instructions (Addendum)
Megan Hooper  07/23/2018      [name], Megan Hooper [place] [address] Phone: [phone] Fax: [phone]    Your procedure is scheduled on Tuesday,  April 28..  Report to [facility], Main Entrance or Entrance "A"at 5:30 AM                 Your surgery or procedure is scheduled for 7:30 A.M.   Call this number if you have problems the morning of surgery: [phone]  This is the number for the Pre- Surgical Desk.    Remember:  Do not eat or drink after midnight Monday, April 27.  Take these medicines the morning of surgery with A SIP OF WATER :                Take if needed:           traMADol (ULTRAM)           albuterol (PROVENTIL HFA;VENTOLIN HFA) - please bring it to the hospital with you.  STOP taking Aspirin, Aspirin Products (Goody Powder, Excedrin Migraine), Ibuprofen (Advil), Naproxen (Aleve), Vitamins and Herbal Products (ie Fish Oil).  Special instructions:   [facility]- Preparing For Surgery  Before surgery, you can play an important role. Because skin is not sterile, your skin needs to be as free of germs as possible. You can reduce the number of germs on your skin by washing with CHG (chlorahexidine gluconate) Soap before surgery.  CHG is an antiseptic cleaner which kills germs and bonds with the skin to continue killing germs even after washing.    Oral Hygiene is also important to reduce your risk of infection.  Remember - BRUSH YOUR TEETH THE MORNING OF SURGERY WITH YOUR REGULAR TOOTHPASTE  Please do not use if you have an allergy to CHG or antibacterial soaps. If your skin becomes reddened/irritated stop using the CHG.  Do not shave (including legs and underarms) for at least 48 hours prior to first CHG shower. It is OK to shave your face.  Please follow these instructions carefully.   1. Shower the NIGHT BEFORE SURGERY and the MORNING OF SURGERY with CHG.   2. If  you chose to wash your hair, wash your hair first as usual with your normal shampoo.  3. After you shampoo, wash your face and private area with the soap you use at home, then rinse your hair and body thoroughly to remove the shampoo and soap.  4. Use CHG as you would any other liquid soap. You can apply CHG directly to the skin and wash gently with a scrungie or a clean washcloth.   5. Apply the CHG Soap to your body ONLY FROM THE NECK DOWN.  Do not use on open wounds or open sores. Avoid contact with your eyes, ears, mouth and genitals (private parts).  6. Wash thoroughly, paying special attention to the area where your surgery will be performed.  7. Thoroughly rinse your body with warm water from the neck down.  8. DO NOT shower/wash with your normal soap after using and rinsing off the CHG Soap.  9. Pat yourself dry with a CLEAN TOWEL.  10. Wear CLEAN PAJAMAS to bed the night before surgery, wear comfortable clothes the morning of surgery  11. Place CLEAN SHEETS on your bed the night of your first shower and DO NOT SLEEP WITH PETS.  Day of Surgery:  Shower as stated Above Do not apply any deodorants/lotions.  Please wear clean clothes to the hospital/surgery center.   Remember to brush your teeth WITH YOUR REGULAR TOOTHPASTE.  Do not wear jewelry, make-up or nail polish.  Do not wear lotions, powders, or perfumes, or deodorant.  Do not shave 48 hours prior to surgery.    Do not bring valuables to the hospital.  [facility] is not responsible for any belongings or valuables.  Contacts, dentures or bridgework may not be worn into surgery.  Leave your suitcase in the car.  After surgery it may be brought to your room.  For patients admitted to the hospital, discharge time will be determined by your treatment team.  Patients discharged the day of surgery will not be allowed to drive home.   Please read over the following fact sheets that you were given.

## 2018-07-26 ENCOUNTER — Other Ambulatory Visit: Payer: Self-pay

## 2018-07-26 ENCOUNTER — Encounter (HOSPITAL_COMMUNITY): Payer: Self-pay

## 2018-07-26 ENCOUNTER — Encounter (HOSPITAL_COMMUNITY)
Admission: RE | Admit: 2018-07-26 | Discharge: 2018-07-26 | Disposition: A | Discharge status: Home / Self Care | Payer: BLUE CROSS/BLUE SHIELD | Source: Ambulatory Visit | Attending: Orthopedic Surgery | Admitting: Orthopedic Surgery

## 2018-07-26 DIAGNOSIS — Z01812 Encounter for preprocedural laboratory examination: Secondary | ICD-10-CM | POA: Diagnosis not present

## 2018-07-26 HISTORY — DX: Anemia, unspecified: D64.9

## 2018-07-26 HISTORY — DX: Overactive bladder: N32.81

## 2018-07-26 LAB — CBC
HCT: 45.9 % (ref 36.0–46.0)
Hemoglobin: 14.8 g/dL (ref 12.0–15.0)
MCH: 29 pg (ref 26.0–34.0)
MCHC: 32.2 g/dL (ref 30.0–36.0)
MCV: 90 fL (ref 80.0–100.0)
Platelets: 353 10*3/uL (ref 150–400)
RBC: 5.1 MIL/uL (ref 3.87–5.11)
RDW: 12.6 % (ref 11.5–15.5)
WBC: 3.8 10*3/uL — ABNORMAL LOW (ref 4.0–10.5)
nRBC: 0 % (ref 0.0–0.2)

## 2018-07-26 LAB — BASIC METABOLIC PANEL
Anion gap: 18 — ABNORMAL HIGH (ref 5–15)
BUN: 11 mg/dL (ref 8–23)
CO2: 16 mmol/L — ABNORMAL LOW (ref 22–32)
Calcium: 9.6 mg/dL (ref 8.9–10.3)
Chloride: 108 mmol/L (ref 98–111)
Creatinine, Ser: 0.67 mg/dL (ref 0.44–1.00)
GFR calc Af Amer: >60 mL/min (ref >60)
GFR calc non Af Amer: >60 mL/min (ref >60)
Glucose, Bld: 108 mg/dL — ABNORMAL HIGH (ref 70–99)
Potassium: 3.8 mmol/L (ref 3.5–5.1)
Sodium: 142 mmol/L (ref 135–145)

## 2018-07-26 NOTE — Progress Notes (Signed)
PCP - Dr. [name] Cardiologist - denies  Chest x-ray - N/A EKG - N/A Stress Test -denies  ECHO - denies Cardiac Cath - denies  Sleep Study -denies  CPAP - denies  Blood Thinner Instructions: N/A Aspirin Instructions:N/A  Anesthesia review: No  Patient denies shortness of breath, fever, cough and chest pain at PAT appointment   Patient verbalized understanding of instructions that were given to them at the PAT appointment. Patient was also instructed that they will need to review over the PAT instructions again at home before surgery.    Coronavirus Screening  Have you experienced the following symptoms:  Cough yes/no: No Fever (>100.48F)  yes/no: No Runny nose yes/no: No Sore throat yes/no: No Difficulty breathing/shortness of breath  yes/no: No  Have you or a family member traveled in the last 14 days and where? yes/no: No   If the patient indicates "YES" to the above questions, their PAT will be rescheduled to limit the exposure to others and, the surgeon will be notified. THE PATIENT WILL NEED TO BE ASYMPTOMATIC FOR 14 DAYS.   If the patient is not experiencing any of these symptoms, the PAT nurse will instruct them to NOT bring anyone with them to their appointment since they may have these symptoms or traveled as well.   Please remind your patients and families that hospital visitation restrictions are in effect and the importance of the restrictions.

## 2018-07-26 NOTE — H&P (Signed)
Megan Hooper is an 63 y.o. female.   Chief Complaint: Left shoulder pain HPI: Patient presents with left shoulder pain following a fall and tumbled in the woods about a week ago.  She is had fairly significant pain which has improved some.  She is right-hand dominant.  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  She denies any other orthopedic complaints.  She is not a smoker.  She does have a history of diagnosis of osteopenia.  Outside radiographs are reviewed and it shows significant displacement of midshaft clavicle fracture with comminution of the fragments.  Past Medical History:  Diagnosis Date  . Allergy   . Anemia   . Asthma   . Dyslipidemia   . Leukopenia    mild  . Osteopenia   . Overactive bladder     Past Surgical History:  Procedure Laterality Date  . ABDOMINAL HYSTERECTOMY      Family History  Problem Relation Age of Onset  . Hyperlipidemia Mother   . Cancer Father    Social History:  reports that she quit smoking about 26 years ago. Her smoking use included cigarettes. She started smoking about 40 years ago. She has a 6.50 pack-year smoking history. She has never used smokeless tobacco. She reports that she does not drink alcohol or use drugs.  Allergies:  Allergies  Allergen Reactions  . Codeine Nausea And Vomiting    No medications prior to admission.    No results found for this or any previous visit (from the past 48 hour(s)). No results found.  Review of Systems  Musculoskeletal: Positive for joint pain.  All other systems reviewed and are negative.   There were no vitals taken for this visit. Physical Exam  Constitutional: She appears well-developed.  HENT:  Head: Normocephalic.  Eyes: Pupils are equal, round, and reactive to light.  Neck: Normal range of motion.  Cardiovascular: Normal rate.  Respiratory: Effort normal.  Neurological: She is alert.  Skin: Skin is warm.  Psychiatric: She has a normal  mood and affect.  Examination of the left shoulder demonstrates motion at the fracture site with shortening of the shoulder girdle.  Skin is intact in that left shoulder girdle region.  Radial pulses intact.  Cervical spine range of motion is full.  Assessment/Plan Impression is displaced left clavicle fracture.  Plan is open reduction internal fixation of the fracture.  I think this fracture actually has a fairly high chance of going on to nonunion based on motion at the fracture fragment today as well as the amount of displacement which is over 1 shaft width.  Discussed with her operative and nonoperative treatment options.  I think in general she would have a quicker and more full or functional recovery with surgery but there is a risk of infection nonunion and possible need for hardware removal.  Also numbness in the chest area is discussed.  All questions answered.  Anderson Malta, MD 07/26/2018, 9:37 AM

## 2018-07-27 ENCOUNTER — Observation Stay (HOSPITAL_COMMUNITY)
Admission: RE | Admit: 2018-07-27 | Discharge: 2018-07-28 | Disposition: A | Discharge status: Home / Self Care | Payer: BLUE CROSS/BLUE SHIELD | Attending: Orthopedic Surgery | Admitting: Orthopedic Surgery

## 2018-07-27 ENCOUNTER — Ambulatory Visit (HOSPITAL_COMMUNITY): Payer: BLUE CROSS/BLUE SHIELD

## 2018-07-27 ENCOUNTER — Ambulatory Visit (HOSPITAL_COMMUNITY)
Admission: RE | Disposition: A | Discharge status: Home / Self Care | Payer: Self-pay | Source: Home / Self Care | Attending: Orthopedic Surgery

## 2018-07-27 ENCOUNTER — Ambulatory Visit (HOSPITAL_COMMUNITY): Payer: BLUE CROSS/BLUE SHIELD | Admitting: Vascular Surgery

## 2018-07-27 ENCOUNTER — Encounter (HOSPITAL_COMMUNITY): Payer: Self-pay

## 2018-07-27 ENCOUNTER — Ambulatory Visit (HOSPITAL_COMMUNITY): Payer: BLUE CROSS/BLUE SHIELD | Admitting: Anesthesiology

## 2018-07-27 DIAGNOSIS — J45909 Unspecified asthma, uncomplicated: Secondary | ICD-10-CM | POA: Diagnosis not present

## 2018-07-27 DIAGNOSIS — S42022A Displaced fracture of shaft of left clavicle, initial encounter for closed fracture: Secondary | ICD-10-CM | POA: Diagnosis not present

## 2018-07-27 DIAGNOSIS — G8918 Other acute postprocedural pain: Secondary | ICD-10-CM | POA: Diagnosis not present

## 2018-07-27 DIAGNOSIS — E785 Hyperlipidemia, unspecified: Secondary | ICD-10-CM | POA: Insufficient documentation

## 2018-07-27 DIAGNOSIS — M8580 Other specified disorders of bone density and structure, unspecified site: Secondary | ICD-10-CM | POA: Diagnosis not present

## 2018-07-27 DIAGNOSIS — S42023A Displaced fracture of shaft of unspecified clavicle, initial encounter for closed fracture: Secondary | ICD-10-CM | POA: Diagnosis present

## 2018-07-27 DIAGNOSIS — S42022P Displaced fracture of shaft of left clavicle, subsequent encounter for fracture with malunion: Secondary | ICD-10-CM | POA: Diagnosis not present

## 2018-07-27 DIAGNOSIS — Z79899 Other long term (current) drug therapy: Secondary | ICD-10-CM | POA: Insufficient documentation

## 2018-07-27 DIAGNOSIS — W1839XA Other fall on same level, initial encounter: Secondary | ICD-10-CM | POA: Diagnosis not present

## 2018-07-27 DIAGNOSIS — J4520 Mild intermittent asthma, uncomplicated: Secondary | ICD-10-CM | POA: Diagnosis not present

## 2018-07-27 DIAGNOSIS — R110 Nausea: Secondary | ICD-10-CM | POA: Diagnosis present

## 2018-07-27 DIAGNOSIS — Z885 Allergy status to narcotic agent status: Secondary | ICD-10-CM | POA: Diagnosis not present

## 2018-07-27 DIAGNOSIS — Z419 Encounter for procedure for purposes other than remedying health state, unspecified: Secondary | ICD-10-CM

## 2018-07-27 DIAGNOSIS — Z87891 Personal history of nicotine dependence: Secondary | ICD-10-CM | POA: Diagnosis not present

## 2018-07-27 DIAGNOSIS — S42002D Fracture of unspecified part of left clavicle, subsequent encounter for fracture with routine healing: Secondary | ICD-10-CM

## 2018-07-27 DIAGNOSIS — S42002A Fracture of unspecified part of left clavicle, initial encounter for closed fracture: Secondary | ICD-10-CM | POA: Diagnosis not present

## 2018-07-27 HISTORY — DX: Fracture of unspecified part of unspecified clavicle, initial encounter for closed fracture: S42.009A

## 2018-07-27 HISTORY — PX: ORIF CLAVICULAR FRACTURE: SHX5055

## 2018-07-27 SURGERY — OPEN REDUCTION INTERNAL FIXATION (ORIF) CLAVICULAR FRACTURE
Anesthesia: General | Site: Chest | Laterality: Left

## 2018-07-27 MED ORDER — GABAPENTIN 300 MG PO CAPS
300.0000 mg | ORAL_CAPSULE | Freq: Three times a day (TID) | ORAL | Status: DC
  Administered 2018-07-27 – 2018-07-28 (×3): 300 mg via ORAL
  Filled 2018-07-27 (×3): qty 1

## 2018-07-27 MED ORDER — TRAMADOL HCL 50 MG PO TABS: 50.0000 mg | ORAL_TABLET | Freq: Three times a day (TID) | ORAL | Status: DC | PRN

## 2018-07-27 MED ORDER — DEXAMETHASONE SODIUM PHOSPHATE 10 MG/ML IJ SOLN
INTRAMUSCULAR | Status: AC
  Filled 2018-07-27: qty 1

## 2018-07-27 MED ORDER — ALBUTEROL SULFATE HFA 108 (90 BASE) MCG/ACT IN AERS: 1.0000 | INHALATION_SPRAY | RESPIRATORY_TRACT | Status: DC | PRN

## 2018-07-27 MED ORDER — LIDOCAINE 2% (20 MG/ML) 5 ML SYRINGE
INTRAMUSCULAR | Status: AC
  Filled 2018-07-27: qty 5

## 2018-07-27 MED ORDER — DEXAMETHASONE SODIUM PHOSPHATE 10 MG/ML IJ SOLN
INTRAMUSCULAR | Status: DC | PRN
  Administered 2018-07-27: 5 mg via INTRAVENOUS

## 2018-07-27 MED ORDER — ONDANSETRON HCL 4 MG/2ML IJ SOLN
4.0000 mg | Freq: Once | INTRAMUSCULAR | Status: AC | PRN
  Administered 2018-07-27: 4 mg via INTRAVENOUS

## 2018-07-27 MED ORDER — ONDANSETRON HCL 4 MG/2ML IJ SOLN
INTRAMUSCULAR | Status: DC | PRN
  Administered 2018-07-27 (×2): 4 mg via INTRAVENOUS

## 2018-07-27 MED ORDER — LACTATED RINGERS IV SOLN
INTRAVENOUS | Status: DC | PRN
  Administered 2018-07-27 (×2): via INTRAVENOUS

## 2018-07-27 MED ORDER — SODIUM CHLORIDE (PF) 0.9 % IJ SOLN
INTRAMUSCULAR | Status: DC | PRN
  Administered 2018-07-27: 5 mL via INTRAVENOUS

## 2018-07-27 MED ORDER — SUGAMMADEX SODIUM 200 MG/2ML IV SOLN
INTRAVENOUS | Status: DC | PRN
  Administered 2018-07-27: 150 mg via INTRAVENOUS

## 2018-07-27 MED ORDER — PNEUMOCOCCAL VAC POLYVALENT 25 MCG/0.5ML IJ INJ: 0.5000 mL | INJECTION | INTRAMUSCULAR | Status: DC

## 2018-07-27 MED ORDER — HEPARIN SODIUM (PORCINE) 10000 UNIT/ML IJ SOLN
INTRAMUSCULAR | Status: DC | PRN
  Administered 2018-07-27: 5 mL via SUBCUTANEOUS

## 2018-07-27 MED ORDER — METOCLOPRAMIDE HCL 5 MG/ML IJ SOLN
10.0000 mg | Freq: Four times a day (QID) | INTRAMUSCULAR | Status: DC | PRN
  Administered 2018-07-27: 10 mg via INTRAVENOUS

## 2018-07-27 MED ORDER — CEFAZOLIN SODIUM-DEXTROSE 2-4 GM/100ML-% IV SOLN
2.0000 g | INTRAVENOUS | Status: AC
  Administered 2018-07-27: 2 g via INTRAVENOUS
  Filled 2018-07-27: qty 100

## 2018-07-27 MED ORDER — FENTANYL CITRATE (PF) 250 MCG/5ML IJ SOLN
INTRAMUSCULAR | Status: DC | PRN
  Administered 2018-07-27: 100 ug via INTRAVENOUS
  Administered 2018-07-27 (×3): 50 ug via INTRAVENOUS

## 2018-07-27 MED ORDER — IBUPROFEN 800 MG PO TABS: 800.0000 mg | ORAL_TABLET | Freq: Two times a day (BID) | ORAL | Status: DC | PRN

## 2018-07-27 MED ORDER — HEPARIN SODIUM (PORCINE) 1000 UNIT/ML IJ SOLN
INTRAMUSCULAR | Status: AC
  Filled 2018-07-27: qty 1

## 2018-07-27 MED ORDER — DOCUSATE SODIUM 100 MG PO CAPS
100.0000 mg | ORAL_CAPSULE | Freq: Two times a day (BID) | ORAL | Status: DC
  Administered 2018-07-27 – 2018-07-28 (×2): 100 mg via ORAL
  Filled 2018-07-27 (×2): qty 1

## 2018-07-27 MED ORDER — METHOCARBAMOL 1000 MG/10ML IJ SOLN
500.0000 mg | Freq: Four times a day (QID) | INTRAVENOUS | Status: DC | PRN
  Filled 2018-07-27: qty 5

## 2018-07-27 MED ORDER — ROCURONIUM BROMIDE 10 MG/ML (PF) SYRINGE
PREFILLED_SYRINGE | INTRAVENOUS | Status: DC | PRN
  Administered 2018-07-27: 10 mg via INTRAVENOUS
  Administered 2018-07-27: 30 mg via INTRAVENOUS
  Administered 2018-07-27: 10 mg via INTRAVENOUS

## 2018-07-27 MED ORDER — METOCLOPRAMIDE HCL 5 MG/ML IJ SOLN
5.0000 mg | Freq: Three times a day (TID) | INTRAMUSCULAR | Status: DC | PRN
  Filled 2018-07-27: qty 2

## 2018-07-27 MED ORDER — ONDANSETRON HCL 4 MG/2ML IJ SOLN
4.0000 mg | Freq: Four times a day (QID) | INTRAMUSCULAR | Status: DC | PRN
  Administered 2018-07-27: 4 mg via INTRAVENOUS
  Filled 2018-07-27: qty 2

## 2018-07-27 MED ORDER — FENTANYL CITRATE (PF) 250 MCG/5ML IJ SOLN
INTRAMUSCULAR | Status: AC
  Filled 2018-07-27: qty 5

## 2018-07-27 MED ORDER — ACETAMINOPHEN 325 MG PO TABS: 325.0000 mg | ORAL_TABLET | Freq: Four times a day (QID) | ORAL | Status: DC | PRN

## 2018-07-27 MED ORDER — SUCCINYLCHOLINE CHLORIDE 200 MG/10ML IV SOSY
PREFILLED_SYRINGE | INTRAVENOUS | Status: AC
  Filled 2018-07-27: qty 10

## 2018-07-27 MED ORDER — MIDAZOLAM HCL 2 MG/2ML IJ SOLN
INTRAMUSCULAR | Status: AC
  Filled 2018-07-27: qty 2

## 2018-07-27 MED ORDER — ROCURONIUM BROMIDE 50 MG/5ML IV SOSY
PREFILLED_SYRINGE | INTRAVENOUS | Status: AC
  Filled 2018-07-27: qty 5

## 2018-07-27 MED ORDER — PHENOL 1.4 % MT LIQD: 1.0000 | OROMUCOSAL | Status: DC | PRN

## 2018-07-27 MED ORDER — PROPOFOL 10 MG/ML IV BOLUS
INTRAVENOUS | Status: AC
  Filled 2018-07-27: qty 20

## 2018-07-27 MED ORDER — PHENYLEPHRINE 40 MCG/ML (10ML) SYRINGE FOR IV PUSH (FOR BLOOD PRESSURE SUPPORT)
PREFILLED_SYRINGE | INTRAVENOUS | Status: AC
  Filled 2018-07-27: qty 10

## 2018-07-27 MED ORDER — MENTHOL 3 MG MT LOZG: 1.0000 | LOZENGE | OROMUCOSAL | Status: DC | PRN

## 2018-07-27 MED ORDER — CHLORHEXIDINE GLUCONATE 4 % EX LIQD: 60.0000 mL | Freq: Once | CUTANEOUS | Status: DC

## 2018-07-27 MED ORDER — ACD FORMULA A 0.73-2.45-2.2 GM/100ML VI SOLN
Status: AC | PRN
  Administered 2018-07-27: 10 mL via INTRAVENOUS

## 2018-07-27 MED ORDER — LIDOCAINE 2% (20 MG/ML) 5 ML SYRINGE
INTRAMUSCULAR | Status: DC | PRN
  Administered 2018-07-27: 60 mg via INTRAVENOUS

## 2018-07-27 MED ORDER — TRAMADOL HCL 50 MG PO TABS
50.0000 mg | ORAL_TABLET | Freq: Four times a day (QID) | ORAL | Status: DC
  Administered 2018-07-27 – 2018-07-28 (×3): 50 mg via ORAL
  Filled 2018-07-27 (×3): qty 1

## 2018-07-27 MED ORDER — METHOCARBAMOL 500 MG PO TABS: 500.0000 mg | ORAL_TABLET | Freq: Four times a day (QID) | ORAL | Status: DC | PRN

## 2018-07-27 MED ORDER — FENTANYL CITRATE (PF) 100 MCG/2ML IJ SOLN
INTRAMUSCULAR | Status: AC
  Filled 2018-07-27: qty 2

## 2018-07-27 MED ORDER — METOCLOPRAMIDE HCL 5 MG/ML IJ SOLN
INTRAMUSCULAR | Status: AC
  Administered 2018-07-27: 10 mg
  Filled 2018-07-27: qty 2

## 2018-07-27 MED ORDER — ONDANSETRON HCL 4 MG/2ML IJ SOLN
INTRAMUSCULAR | Status: AC
  Filled 2018-07-27: qty 2

## 2018-07-27 MED ORDER — CEFAZOLIN SODIUM-DEXTROSE 2-4 GM/100ML-% IV SOLN
2.0000 g | Freq: Four times a day (QID) | INTRAVENOUS | Status: AC
  Administered 2018-07-27 (×2): 2 g via INTRAVENOUS
  Filled 2018-07-27 (×2): qty 100

## 2018-07-27 MED ORDER — PHENYLEPHRINE 40 MCG/ML (10ML) SYRINGE FOR IV PUSH (FOR BLOOD PRESSURE SUPPORT)
PREFILLED_SYRINGE | INTRAVENOUS | Status: DC | PRN
  Administered 2018-07-27: 80 ug via INTRAVENOUS
  Administered 2018-07-27: 40 ug via INTRAVENOUS

## 2018-07-27 MED ORDER — PROPOFOL 10 MG/ML IV BOLUS
INTRAVENOUS | Status: DC | PRN
  Administered 2018-07-27: 160 mg via INTRAVENOUS

## 2018-07-27 MED ORDER — FENTANYL CITRATE (PF) 100 MCG/2ML IJ SOLN
25.0000 ug | INTRAMUSCULAR | Status: DC | PRN
  Administered 2018-07-27: 50 ug via INTRAVENOUS

## 2018-07-27 MED ORDER — SODIUM CHLORIDE 0.9 % IV SOLN
INTRAVENOUS | Status: DC | PRN
  Administered 2018-07-27: 20 ug/min via INTRAVENOUS

## 2018-07-27 MED ORDER — ASPIRIN EC 81 MG PO TBEC
81.0000 mg | DELAYED_RELEASE_TABLET | Freq: Every day | ORAL | Status: DC
  Administered 2018-07-27 – 2018-07-28 (×2): 81 mg via ORAL
  Filled 2018-07-27 (×2): qty 1

## 2018-07-27 MED ORDER — METOCLOPRAMIDE HCL 5 MG PO TABS: 5.0000 mg | ORAL_TABLET | Freq: Three times a day (TID) | ORAL | Status: DC | PRN

## 2018-07-27 MED ORDER — MIRABEGRON ER 25 MG PO TB24
50.0000 mg | ORAL_TABLET | Freq: Every day | ORAL | Status: DC
  Administered 2018-07-28: 50 mg via ORAL
  Filled 2018-07-27: qty 2

## 2018-07-27 MED ORDER — MIDAZOLAM HCL 5 MG/5ML IJ SOLN
INTRAMUSCULAR | Status: DC | PRN
  Administered 2018-07-27: 2 mg via INTRAVENOUS

## 2018-07-27 MED ORDER — ONDANSETRON HCL 4 MG PO TABS
4.0000 mg | ORAL_TABLET | Freq: Four times a day (QID) | ORAL | Status: DC | PRN
  Administered 2018-07-28: 4 mg via ORAL
  Filled 2018-07-27: qty 1

## 2018-07-27 MED ORDER — SUCCINYLCHOLINE CHLORIDE 200 MG/10ML IV SOSY
PREFILLED_SYRINGE | INTRAVENOUS | Status: DC | PRN
  Administered 2018-07-27: 140 mg via INTRAVENOUS

## 2018-07-27 MED FILL — Dexmedetomidine HCl in NaCl 0.9% IV Soln 80 MCG/20ML: INTRAVENOUS | Qty: 20 | Status: AC

## 2018-07-27 SURGICAL SUPPLY — 85 items
APL SKNCLS STERI-STRIP NONHPOA (GAUZE/BANDAGES/DRESSINGS) ×1
BENZOIN TINCTURE PRP APPL 2/3 (GAUZE/BANDAGES/DRESSINGS) ×2 IMPLANT
BIT DRILL 2 CANN GRADUATED (BIT) ×1 IMPLANT
BIT DRILL 2.5 CANN ENDOSCOPIC (BIT) ×2 IMPLANT
COVER SURGICAL LIGHT HANDLE (MISCELLANEOUS) ×2 IMPLANT
COVER WAND RF STERILE (DRAPES) ×2 IMPLANT
DRAIN PENROSE 1/2X12 LTX STRL (WOUND CARE) IMPLANT
DRAPE C-ARM 42X72 X-RAY (DRAPES) IMPLANT
DRAPE IMP U-DRAPE 54X76 (DRAPES) ×2 IMPLANT
DRAPE INCISE IOBAN 66X45 STRL (DRAPES) ×4 IMPLANT
DRAPE LAPAROTOMY 100X72 PEDS (DRAPES) ×4 IMPLANT
DRAPE ORTHO SPLIT 77X108 STRL (DRAPES) ×4
DRAPE SURG ORHT 6 SPLT 77X108 (DRAPES) IMPLANT
DRAPE U-SHAPE 47X51 STRL (DRAPES) ×4 IMPLANT
DRESSING AQUACEL AG SP 3.5X4 (GAUZE/BANDAGES/DRESSINGS) ×1 IMPLANT
DRSG AQUACEL AG ADV 3.5X 4 (GAUZE/BANDAGES/DRESSINGS) ×2 IMPLANT
DRSG AQUACEL AG ADV 3.5X 6 (GAUZE/BANDAGES/DRESSINGS) ×2 IMPLANT
DRSG AQUACEL AG ADV 3.5X10 (GAUZE/BANDAGES/DRESSINGS) ×1 IMPLANT
DRSG AQUACEL AG SP 3.5X4 (GAUZE/BANDAGES/DRESSINGS) ×2
DRSG MEPILEX BORDER 4X12 (GAUZE/BANDAGES/DRESSINGS) IMPLANT
DRSG MEPILEX BORDER 4X8 (GAUZE/BANDAGES/DRESSINGS) ×2 IMPLANT
DRSG PAD ABDOMINAL 8X10 ST (GAUZE/BANDAGES/DRESSINGS) IMPLANT
DURAPREP 26ML APPLICATOR (WOUND CARE) ×2 IMPLANT
ELECT REM PT RETURN 9FT ADLT (ELECTROSURGICAL) ×2
ELECTRODE REM PT RTRN 9FT ADLT (ELECTROSURGICAL) ×1 IMPLANT
FACESHIELD WRAPAROUND (MASK) IMPLANT
FACESHIELD WRAPAROUND OR TEAM (MASK) ×1 IMPLANT
GAUZE SPONGE 4X4 12PLY STRL (GAUZE/BANDAGES/DRESSINGS) IMPLANT
GAUZE XEROFORM 5X9 LF (GAUZE/BANDAGES/DRESSINGS) IMPLANT
GLOVE BIO SURGEON ST LM GN SZ9 (GLOVE) ×2 IMPLANT
GLOVE BIOGEL PI IND STRL 8 (GLOVE) ×1 IMPLANT
GLOVE BIOGEL PI INDICATOR 8 (GLOVE) ×1
GLOVE SURG ORTHO 8.0 STRL STRW (GLOVE) ×2 IMPLANT
GOWN STRL REUS W/ TWL LRG LVL3 (GOWN DISPOSABLE) ×2 IMPLANT
GOWN STRL REUS W/ TWL XL LVL3 (GOWN DISPOSABLE) ×1 IMPLANT
GOWN STRL REUS W/TWL LRG LVL3 (GOWN DISPOSABLE) ×4
GOWN STRL REUS W/TWL XL LVL3 (GOWN DISPOSABLE) ×6
K-WIRE BB-TAK (WIRE) ×4
KIT BASIN OR (CUSTOM PROCEDURE TRAY) ×2 IMPLANT
KIT BONE MRW ASP ANGEL CPRP (KITS) ×1 IMPLANT
KIT TURNOVER KIT B (KITS) ×2 IMPLANT
KWIRE BB-TAK (WIRE) IMPLANT
MANIFOLD NEPTUNE II (INSTRUMENTS) ×2 IMPLANT
NDL 18GX1X1/2 (RX/OR ONLY) (NEEDLE) IMPLANT
NEEDLE 18GX1X1/2 (RX/OR ONLY) (NEEDLE) ×4 IMPLANT
NEEDLE 21X1 OR PACK (NEEDLE) IMPLANT
NEEDLE HYPO 25GX1X1/2 BEV (NEEDLE) ×2 IMPLANT
NS IRRIG 1000ML POUR BTL (IV SOLUTION) ×2 IMPLANT
PACK SHOULDER (CUSTOM PROCEDURE TRAY) ×2 IMPLANT
PACK UNIVERSAL I (CUSTOM PROCEDURE TRAY) ×2 IMPLANT
PAD ARMBOARD 7.5X6 YLW CONV (MISCELLANEOUS) ×4 IMPLANT
PENCIL BUTTON HOLSTER BLD 10FT (ELECTRODE) IMPLANT
PLATE DISTAL CLAVICLE ING LFT (Plate) ×1 IMPLANT
PUTTY DBM ALLOSYNC PURE 10CC (Putty) ×1 IMPLANT
SCREW LOCK T10 FT 18X2.7X (Screw) IMPLANT
SCREW LOCK T15 FT 10X3.5XST (Screw) ×1 IMPLANT
SCREW LOCKING 2.7X12 ANKLE (Screw) ×1 IMPLANT
SCREW LOCKING 2.7X14MM (Screw) ×3 IMPLANT
SCREW LOCKING 2.7X16MM (Screw) ×2 IMPLANT
SCREW LOCKING 2.7X18MM (Screw) ×4 IMPLANT
SCREW LOCKING 3.5X10 (Screw) ×2 IMPLANT
SCREW LOW PROFILE 3.5X14 (Screw) ×5 IMPLANT
SCREW LOW PROFILE 3.5X16 (Screw) ×2 IMPLANT
SLING ARM IMMOBILIZER LRG (SOFTGOODS) ×2 IMPLANT
SLING ARM IMMOBILIZER MED (SOFTGOODS) IMPLANT
SPONGE LAP 4X18 RFD (DISPOSABLE) ×4 IMPLANT
STAPLER VISISTAT 35W (STAPLE) IMPLANT
STRIP CLOSURE SKIN 1/2X4 (GAUZE/BANDAGES/DRESSINGS) ×1 IMPLANT
SUCTION FRAZIER HANDLE 10FR (MISCELLANEOUS)
SUCTION TUBE FRAZIER 10FR DISP (MISCELLANEOUS) IMPLANT
SUT ETHILON 3 0 PS 1 (SUTURE) ×1 IMPLANT
SUT FIBERWIRE #2 38 REV NDL BL (SUTURE) ×2
SUT MNCRL AB 3-0 PS2 18 (SUTURE) ×1 IMPLANT
SUT VIC AB 1 CT1 27 (SUTURE) ×8
SUT VIC AB 1 CT1 27XBRD ANBCTR (SUTURE) ×4 IMPLANT
SUT VIC AB 2-0 CT1 27 (SUTURE) ×2
SUT VIC AB 2-0 CT1 TAPERPNT 27 (SUTURE) IMPLANT
SUT VIC AB 2-0 CTB1 (SUTURE) IMPLANT
SUT VICRYL 0 UR6 27IN ABS (SUTURE) ×2 IMPLANT
SUTURE FIBERWR#2 38 REV NDL BL (SUTURE) IMPLANT
TOWEL OR 17X24 6PK STRL BLUE (TOWEL DISPOSABLE) ×2 IMPLANT
TOWEL OR 17X26 10 PK STRL BLUE (TOWEL DISPOSABLE) ×2 IMPLANT
TUBE CONNECTING 12X1/4 (SUCTIONS) IMPLANT
WATER STERILE IRR 1000ML POUR (IV SOLUTION) ×2 IMPLANT
YANKAUER SUCT BULB TIP NO VENT (SUCTIONS) IMPLANT

## 2018-07-27 NOTE — Plan of Care (Signed)
  Problem: Education: Goal: Knowledge of General Education information will improve Description Including pain rating scale, medication(s)/side effects and non-pharmacologic comfort measures Outcome: Progressing   Problem: Education: Goal: Required Educational Video(s) Outcome: Progressing   Problem: Clinical Measurements: Goal: Postoperative complications will be avoided or minimized Outcome: Progressing   Problem: Skin Integrity: Goal: Demonstration of wound healing without infection will improve Outcome: Progressing

## 2018-07-27 NOTE — Transfer of Care (Signed)
Immediate Anesthesia Transfer of Care Note  Patient: Megan Hooper  Procedure(s) Performed: OPEN REDUCTION INTERNAL FIXATION (ORIF) LEFT CLAVICULAR FRACTURE AND POSSIBLE BONE MARROW ASPIRATION FOR GRAFTING (Left Chest)  Patient Location: PACU  Anesthesia Type:GA combined with regional for post-op pain  Level of Consciousness: awake, alert  and oriented  Airway & Oxygen Therapy: Patient Spontanous Breathing and Patient connected to nasal cannula oxygen  Post-op Assessment: Report given to RN and Post -op Vital signs reviewed and stable  Post vital signs: Reviewed and stable  Last Vitals:  Vitals Value Taken Time  BP 155/83 07/27/2018 11:00 AM  Temp    Pulse 77 07/27/2018 11:05 AM  Resp 10 07/27/2018 11:05 AM  SpO2 95 % 07/27/2018 11:05 AM  Vitals shown include unvalidated device data.  Last Pain:  Vitals:   07/27/18 1045  TempSrc:   PainSc: 6       Patients Stated Pain Goal: 4 (61/51/83 4373)  Complications: No apparent anesthesia complications

## 2018-07-27 NOTE — Anesthesia Procedure Notes (Signed)
Procedure Name: Intubation Date/Time: 07/27/2018 7:45 AM Performed by: Marsa Aris, CRNA Pre-anesthesia Checklist: Patient identified, Emergency Drugs available, Suction available and Patient being monitored Patient Re-evaluated:Patient Re-evaluated prior to induction Oxygen Delivery Method: Circle System Utilized Preoxygenation: Pre-oxygenation with 100% oxygen Induction Type: IV induction and Rapid sequence Laryngoscope Size: Miller and 2 Grade View: Grade I Tube type: Oral Number of attempts: 1 Airway Equipment and Method: Stylet and Oral airway Placement Confirmation: ETT inserted through vocal cords under direct vision,  positive ETCO2 and breath sounds checked- equal and bilateral Secured at: 21 cm Tube secured with: Tape Dental Injury: Teeth and Oropharynx as per pre-operative assessment  Comments: No change in dentition from pre-procedure

## 2018-07-27 NOTE — Anesthesia Preprocedure Evaluation (Signed)
Anesthesia Evaluation  Patient identified by MRN, date of birth, ID band Patient awake    Reviewed: Allergy & Precautions, NPO status , Patient's Chart, lab work & pertinent test results  Airway Mallampati: II  TM Distance: >3 FB Neck ROM: Full    Dental  (+) Teeth Intact, Dental Advisory Given   Pulmonary former smoker,    breath sounds clear to auscultation       Cardiovascular  Rhythm:Regular Rate:Normal     Neuro/Psych    GI/Hepatic   Endo/Other    Renal/GU      Musculoskeletal   Abdominal   Peds  Hematology   Anesthesia Other Findings   Reproductive/Obstetrics                             Anesthesia Physical Anesthesia Plan  ASA: II  Anesthesia Plan: General   Post-op Pain Management:    Induction:   PONV Risk Score and Plan: Ondansetron and Dexamethasone  Airway Management Planned: Oral ETT  Additional Equipment:   Intra-op Plan:   Post-operative Plan: Extubation in OR  Informed Consent: I have reviewed the patients History and Physical, chart, labs and discussed the procedure including the risks, benefits and alternatives for the proposed anesthesia with the patient or authorized representative who has indicated his/her understanding and acceptance.     Dental advisory given  Plan Discussed with: CRNA and Anesthesiologist  Anesthesia Plan Comments:         Anesthesia Quick Evaluation

## 2018-07-27 NOTE — Op Note (Signed)
NAMEYAZLEEN, MOLOCK MEDICAL RECORD XT:[id number] ACCOUNT [id number] DATE OF BIRTH:27-Apr-1955 FACILITY: [facility] LOCATION: [facility] PHYSICIAN:Cleva Camero Randel Pigg, MD  OPERATIVE REPORT  DATE OF PROCEDURE:  07/27/2018  PREOPERATIVE DIAGNOSIS:  Left clavicle fracture, displaced.  POSTOPERATIVE DIAGNOSIS:  Left clavicle fracture, displaced.  PROCEDURES:   1.  Left clavicle open reduction internal fixation with Arthrex plate and bone marrow aspirate.  2.  Bone grafting from the iliac crest.  SURGEON:  Meredith Pel, MD  ASSISTANT:  Erskine Emery, PA  ANESTHESIA:  General.  INDICATIONS:  This is a patient with left clavicle fracture, displaced who presents for operative management after explanation of risks and benefits.  PROCEDURE IN DETAIL:  The patient was brought to the operating room where general anesthetic was induced.  Preoperative IV antibiotics were administered.  Timeout was called.  The patient was placed in a little bit of a reverse Trendelenburg.  The iliac  crest was also prepped on the left hand side.  This was also prescrubbed with alcohol and Betadine, allowed to air dry prepped with DuraPrep solution and draped in a sterile manner with [place] covering the operative field.  Timeout was called.  Anterior  superior iliac crest was identified and then a small incision was made about 3 fingerbreadths proximal to the anterior superior iliac crest.  A large bore bone marrow aspirate trocar was placed between the inner and outer table.   Bone marrow aspirate  was then drawn out and then mixed with bone graft on the back table.  This is Arthrex type bone graft mixed with the bone marrow aspirate.  This incision was irrigated and closed using simple 3-0 nylon sutures x3.  This was then covered with an Aquacel  dressing.  Attention then directed towards the clavicle on the left hand side.  Incision was made along the course of the clavicle from the [facility] joint to the medial aspect.  Skin  and subcutaneous tissue were sharply divided.  Crossing nerve branches were  preserved when possible.  Fracture site was identified.  Mobilization of the periosteal sleeve on the anterior and posterior aspect of each clavicle fracture fragment was achieved.  There was comminution to the fracture.  The fracture was reduced and an  Arthrex plate, which was a lateral plate, was applied.  Five screws achieved in the medial aspect of the fracture with 4 being bicortical and 1 being a locking not bicortical screw.  On the lateral side, 1 bicortical screw was placed along with 5 locking  lateral screws.  Correct placement was confirmed in the AP and lateral planes under fluoroscopy.  At this time, thorough irrigation was performed.  The bone graft was then applied to the comminuted fracture site region, both the anterior and posterior.  The periosteal sleeve was then closed around the fracture and the clavicle.  This was done using #1 Vicryl suture followed by interrupted inverted 0 Vicryl suture, 2-0 Vicryl suture and a 3-0 Monocryl.    The patient tolerated the procedure well without immediate complications.  Steri-Strips were applied.  Aquacel dressing then applied to his shoulder.    Artis Delay Clark's assistance was required at all times for retraction, opening and closing, fracture reduction.  His assistance was medical necessity.  AN/NUANCE  D:07/27/2018 T:07/27/2018 JOB:[id number]

## 2018-07-27 NOTE — Progress Notes (Signed)
Right as the patient was ready to be rolled out for discharge she vomited. MD Linna Caprice and MD Marlou Sa were notified and I will continue to monitor the patient until plan has been decided.

## 2018-07-27 NOTE — Brief Op Note (Signed)
07/27/2018  10:36 AM  PATIENT:  Megan Hooper  63 y.o. female  PRE-OPERATIVE DIAGNOSIS:  left clavicle fracture  POST-OPERATIVE DIAGNOSIS:  left clavicle fracture  PROCEDURE:  Procedure(s): OPEN REDUCTION INTERNAL FIXATION (ORIF) LEFT CLAVICULAR FRACTURE AND ILIAC CREST BONE MARROW ASPIRATION FOR GRAFTING  SURGEON:  Surgeon(s): Marlou Sa, Tonna Corner, MD  ASSISTANT: Erskine Emery, PA  ANESTHESIA:   general  EBL: 50 ml    Total I/O In: 1000 [I.V.:1000] Out: 50 [Blood:50]  BLOOD ADMINISTERED: none  DRAINS: none   LOCAL MEDICATIONS USED:  none  SPECIMEN:  No Specimen  COUNTS:  YES  TOURNIQUET:  * No tourniquets in log *  DICTATION: .Other Dictation: Dictation Number [id number]  PLAN OF CARE: Discharge to home after PACU  PATIENT DISPOSITION:  PACU - hemodynamically stable

## 2018-07-27 NOTE — Anesthesia Procedure Notes (Signed)
Anesthesia Regional Block: Interscalene brachial plexus block   Pre-Anesthetic Checklist: ,, timeout performed, Correct Patient, Correct Site, Correct Laterality, Correct Procedure, Correct Position, site marked, Risks and benefits discussed,  Surgical consent,  Pre-op evaluation,  At surgeon's request and post-op pain management  Laterality: Left  Prep: chloraprep       Needles:  Injection technique: Single-shot  Needle Type: Stimulator Needle - 40      Needle Gauge: 22     Additional Needles:   Procedures:, nerve stimulator,,,,,,,  Narrative:  Start time: 07/27/2018 7:10 AM End time: 07/27/2018 7:20 AM Injection made incrementally with aspirations every 5 mL.  Performed by: Personally   Additional Notes: 20 cc 0.75% Naropin injected easily

## 2018-07-27 NOTE — Interval H&P Note (Signed)
History and Physical Interval Note:  07/27/2018 7:31 AM  Megan Hooper  has presented today for surgery, with the diagnosis of left clavicle fracture.  The various methods of treatment have been discussed with the patient and family. After consideration of risks, benefits and other options for treatment, the patient has consented to  Procedure(s): OPEN REDUCTION INTERNAL FIXATION (ORIF) LEFT CLAVICULAR FRACTURE AND POSSIBLE BONE MARROW ASPIRATION FOR GRAFTING (Left) as a surgical intervention.  The patient's history has been reviewed, patient examined, no change in status, stable for surgery.  I have reviewed the patient's chart and labs.  Questions were answered to the patient's satisfaction.     Anderson Malta

## 2018-07-28 ENCOUNTER — Encounter (HOSPITAL_COMMUNITY): Payer: Self-pay | Admitting: Orthopedic Surgery

## 2018-07-28 DIAGNOSIS — J45909 Unspecified asthma, uncomplicated: Secondary | ICD-10-CM | POA: Diagnosis not present

## 2018-07-28 DIAGNOSIS — S42022A Displaced fracture of shaft of left clavicle, initial encounter for closed fracture: Secondary | ICD-10-CM | POA: Diagnosis not present

## 2018-07-28 DIAGNOSIS — M8580 Other specified disorders of bone density and structure, unspecified site: Secondary | ICD-10-CM | POA: Diagnosis not present

## 2018-07-28 DIAGNOSIS — E785 Hyperlipidemia, unspecified: Secondary | ICD-10-CM | POA: Diagnosis not present

## 2018-07-28 DIAGNOSIS — Z87891 Personal history of nicotine dependence: Secondary | ICD-10-CM | POA: Diagnosis not present

## 2018-07-28 DIAGNOSIS — Z885 Allergy status to narcotic agent status: Secondary | ICD-10-CM | POA: Diagnosis not present

## 2018-07-28 DIAGNOSIS — W1839XA Other fall on same level, initial encounter: Secondary | ICD-10-CM | POA: Diagnosis not present

## 2018-07-28 DIAGNOSIS — Z79899 Other long term (current) drug therapy: Secondary | ICD-10-CM | POA: Diagnosis not present

## 2018-07-28 MED ORDER — ONDANSETRON HCL 4 MG PO TABS: 4.0000 mg | ORAL_TABLET | Freq: Four times a day (QID) | ORAL | 0 refills | Status: DC | PRN

## 2018-07-28 MED ORDER — GABAPENTIN 300 MG PO CAPS: 300.0000 mg | ORAL_CAPSULE | Freq: Three times a day (TID) | ORAL | 0 refills | Status: DC

## 2018-07-28 NOTE — Progress Notes (Signed)
Pt is ambulating independently, left arm sling on. Left shoulder dressing dry and intact, denies pain. Discharge instructions given to pt. Discharged to home picked up by family.

## 2018-07-28 NOTE — Progress Notes (Signed)
Subjective: Pt stable - pain ok   Objective: Vital signs in last 24 hours: Temp:  [97 F (36.1 C)-98 F (36.7 C)] 98 F (36.7 C) (04/29 0404) Pulse Rate:  [74-98] 90 (04/29 0404) Resp:  [10-17] 16 (04/29 0404) BP: (104-158)/(66-86) 104/69 (04/29 0404) SpO2:  [92 %-100 %] 95 % (04/29 0404) Weight:  [72.3 kg] 72.3 kg (04/29 0700)  Intake/Output from previous day: 04/28 0701 - 04/29 0700 In: 1300 [I.V.:1200] Out: 550 [Urine:500; Blood:50] Intake/Output this shift: No intake/output data recorded.  Exam:  No cellulitis present  Labs: Recent Labs    07/26/18 0933  HGB 14.8   Recent Labs    07/26/18 0933  WBC 3.8*  RBC 5.10  HCT 45.9  PLT 353   Recent Labs    07/26/18 0933  NA 142  K 3.8  CL 108  CO2 16*  BUN 11  CREATININE 0.67  GLUCOSE 108*  CALCIUM 9.6   No results for input(s): LABPT, INR in the last 72 hours.  Assessment/Plan: Plan dc today   G Scott Marlou Sa 07/28/2018, 8:29 AM

## 2018-07-28 NOTE — Anesthesia Postprocedure Evaluation (Signed)
Anesthesia Post Note  Patient: Megan Hooper  Procedure(s) Performed: OPEN REDUCTION INTERNAL FIXATION (ORIF) LEFT CLAVICULAR FRACTURE AND POSSIBLE BONE MARROW ASPIRATION FOR GRAFTING (Left Chest)     Patient location during evaluation: PACU Anesthesia Type: General Level of consciousness: awake and awake and alert Pain management: pain level controlled Vital Signs Assessment: post-procedure vital signs reviewed and stable Respiratory status: spontaneous breathing, nonlabored ventilation and respiratory function stable Cardiovascular status: blood pressure returned to baseline Comments: Patient had persistent nausea post-op and was admitted for IV fluids and nausea treatment overnight.    Last Vitals:  Vitals:   07/27/18 2005 07/28/18 0404  BP: 123/76 104/69  Pulse: 98 90  Resp: 16 16  Temp:  36.7 C  SpO2: 97% 95%    Last Pain:  Vitals:   07/28/18 0404  TempSrc: Oral  PainSc:                  Maribelle Hopple COKER

## 2018-07-28 NOTE — Evaluation (Signed)
Occupational Therapy Evaluation Patient Details Name: Megan Hooper MRN: [id number] DOB: [birth year] Today's Date: 07/28/2018    History of Present Illness OPEN REDUCTION INTERNAL FIXATION (ORIF) LEFT CLAVICULAR FRACTURE AND ILIAC CREST BONE MARROW ASPIRATION FOR GRAFTING   Clinical Impression   OT eval and education complete.  Handout provided    Follow Up Recommendations  Follow surgeon's recommendation for DC plan and follow-up therapies    Equipment Recommendations  None recommended by OT    Recommendations for Other Services       Precautions / Restrictions Precautions Precautions: Shoulder Shoulder Interventions: Shoulder sling/immobilizer      Mobility Bed Mobility       pt is I            Transfers        pt is I                  ADL either performed or assessed with clinical judgement                      Pertinent Vitals/Pain Pain Assessment: No/denies pain        Extremity/Trunk Assessment             Communication Communication Communication: No difficulties   Cognition Arousal/Alertness: Awake/alert Behavior During Therapy: WFL for tasks assessed/performed Overall Cognitive Status: Within Functional Limits for tasks assessed                                           Shoulder Instructions Shoulder Instructions Donning/doffing shirt without moving shoulder: Independent Method for sponge bathing under operated UE: Independent Donning/doffing sling/immobilizer: Independent Correct positioning of sling/immobilizer: Independent  ROM for elbow, wrist and digits of operated UE: Independent Sling wearing schedule (on at all times/off for ADL's): Independent Proper positioning of operated UE when showering: Independent Positioning of UE while sleeping: Industry expects to be discharged to:: Private residence Living Arrangements: Alone Available Help at Discharge:  Family Type of Home: House                                  Prior Functioning/Environment Level of Independence: Independent                          OT Goals(Current goals can be found in the care plan section) Acute Rehab OT Goals Patient Stated Goal: home today OT Goal Formulation: With patient  OT Frequency:      AM-PAC OT "6 Clicks" Daily Activity     Outcome Measure Help from another person eating meals?: None Help from another person taking care of personal grooming?: None Help from another person toileting, which includes using toliet, bedpan, or urinal?: None Help from another person bathing (including washing, rinsing, drying)?: None Help from another person to put on and taking off regular upper body clothing?: None Help from another person to put on and taking off regular lower body clothing?: None 6 Click Score: 24   End of Session Nurse Communication: Precautions  Activity Tolerance: Patient tolerated treatment well Patient left: in chair;with call bell/phone within reach                   Time: 1144-1154 OT Time Calculation (min): 10  min Charges:  OT General Charges $OT Visit: 1 Visit OT Evaluation $OT Eval Low Complexity: [place], OT Acute Rehabilitation Services [phone] Office- [place], Edwena Felty D 07/28/2018, 2:15 PM

## 2018-07-30 NOTE — Discharge Summary (Signed)
Physician Discharge Summary  Patient ID: Megan Hooper MRN: [id number] DOB/AGE: Jan 16, 1956 63 y.o.  Admit date: 07/27/2018 Discharge date: 07/28/2018  Admission Diagnoses:  Active Problems:   Nausea   Clavicle fracture, shaft   Discharge Diagnoses:  Same  Surgeries: Procedure(s): OPEN REDUCTION INTERNAL FIXATION (ORIF) LEFT CLAVICULAR FRACTURE AND POSSIBLE BONE MARROW ASPIRATION FOR GRAFTING on 07/27/2018   Consultants:   Discharged Condition: Stable  Hospital Course: Megan Hooper is an 63 y.o. female who was admitted 07/27/2018 with a chief complaint of left shoulder pain, and found to have a diagnosis of left clavicle fracture.  They were brought to the operating room on 07/27/2018 and underwent the above named patient tolerated the procedure well.  Postop day #1 her nausea was controlled.  She sent home with tramadol for pain and Zofran for nausea.  She will follow-up with me in about 1 week.  Procedures.    Antibiotics given:  Anti-infectives (From admission, onward)   Start     Dose/Rate Route Frequency Ordered Stop   07/27/18 1600  ceFAZolin (ANCEF) IVPB 2g/100 mL premix     2 g 200 mL/hr over 30 Minutes Intravenous Every 6 hours 07/27/18 1513 07/27/18 2212   07/27/18 0600  ceFAZolin (ANCEF) IVPB 2g/100 mL premix     2 g 200 mL/hr over 30 Minutes Intravenous On call to O.R. 07/27/18 3546 07/27/18 0755    .  Recent vital signs:  Vitals:   07/27/18 2005 07/28/18 0404  BP: 123/76 104/69  Pulse: 98 90  Resp: 16 16  Temp:  98 F (36.7 C)  SpO2: 97% 95%    Recent laboratory studies:  Results for orders placed or performed during the hospital encounter of 07/26/18  CBC  Result Value Ref Range   WBC 3.8 (L) 4.0 - 10.5 K/uL   RBC 5.10 3.87 - 5.11 MIL/uL   Hemoglobin 14.8 12.0 - 15.0 g/dL   HCT 45.9 36.0 - 46.0 %   MCV 90.0 80.0 - 100.0 fL   MCH 29.0 26.0 - 34.0 pg   MCHC 32.2 30.0 - 36.0 g/dL   RDW 12.6 11.5 - 15.5 %   Platelets 353 150 - 400 K/uL   nRBC 0.0  0.0 - 0.2 %  Basic metabolic panel  Result Value Ref Range   Sodium 142 135 - 145 mmol/L   Potassium 3.8 3.5 - 5.1 mmol/L   Chloride 108 98 - 111 mmol/L   CO2 16 (L) 22 - 32 mmol/L   Glucose, Bld 108 (H) 70 - 99 mg/dL   BUN 11 8 - 23 mg/dL   Creatinine, Ser 0.67 0.44 - 1.00 mg/dL   Calcium 9.6 8.9 - 10.3 mg/dL   GFR calc non Af Amer >60 >60 mL/min   GFR calc Af Amer >60 >60 mL/min   Anion gap 18 (H) 5 - 15    Discharge Medications:   Allergies as of 07/28/2018      Reactions   Codeine Nausea And Vomiting      Medication List    TAKE these medications   albuterol 108 (90 Base) MCG/ACT inhaler Commonly known as:  VENTOLIN HFA Inhale 1-2 puffs into the lungs every 4 (four) hours as needed for wheezing or shortness of breath.   gabapentin 300 MG capsule Commonly known as:  NEURONTIN Take 1 capsule (300 mg total) by mouth 3 (three) times daily.   ibuprofen 800 MG tablet Commonly known as:  ADVIL Take 1 tablet (800 mg total) by mouth  2 (two) times daily as needed.   mirabegron ER 50 MG Tb24 tablet Commonly known as:  MYRBETRIQ Take 50 mg by mouth daily.   ondansetron 4 MG tablet Commonly known as:  ZOFRAN Take 1 tablet (4 mg total) by mouth every 6 (six) hours as needed for nausea.   traMADol 50 MG tablet Commonly known as:  ULTRAM Take 1 tablet (50 mg total) by mouth 3 (three) times daily as needed.       Diagnostic Studies: Dg Clavicle Left  Result Date: 07/27/2018 CLINICAL DATA:  Status post open reduction of left clavicular fracture. EXAM: DG C-ARM 61-120 MIN; LEFT CLAVICLE - 2+ VIEWS FLUOROSCOPY TIME:  8 seconds. COMPARISON:  Radiographs of July 22, 2018. FINDINGS: Two intraoperative fluoroscopic images demonstrate surgical internal fixation of left clavicular fracture. Good alignment of fracture components is noted. Mildly displaced left second and third rib fractures are noted. IMPRESSION: Status post surgical internal fixation of left clavicular fracture.  Electronically Signed   By: Marijo Conception M.D.   On: 07/27/2018 10:20   Dg Shoulder Left  Result Date: 07/12/2018 CLINICAL DATA:  Fall 2 days ago, with persistent left shoulder pain. EXAM: LEFT SHOULDER - 2+ VIEW COMPARISON:  None. FINDINGS: There is an acute, displaced fracture involving the distal third of the clavicle with associated foreshortening. There is an approximately 1.3 cm displaced ossicle adjacent to the main fracture site. No definitive extension to involve the acromioclavicular joint. Glenohumeral joint spaces appear preserved. Limited visualization of the adjacent thorax suggests a potential minimally displaced fracture involving the posterior aspect of the left first rib. No pneumothorax. Adjacent soft tissue swelling.  No radiopaque foreign body. IMPRESSION: 1. Acute, displaced clavicular fracture without definitive extension to the acromioclavicular joint. 2. Suspected nondisplaced fracture involving the posterior aspect of the left first rib. Electronically Signed   By: Sandi Mariscal M.D.   On: 07/12/2018 13:19   Dg C-arm 1-60 Min  Result Date: 07/27/2018 CLINICAL DATA:  Status post open reduction of left clavicular fracture. EXAM: DG C-ARM 61-120 MIN; LEFT CLAVICLE - 2+ VIEWS FLUOROSCOPY TIME:  8 seconds. COMPARISON:  Radiographs of July 22, 2018. FINDINGS: Two intraoperative fluoroscopic images demonstrate surgical internal fixation of left clavicular fracture. Good alignment of fracture components is noted. Mildly displaced left second and third rib fractures are noted. IMPRESSION: Status post surgical internal fixation of left clavicular fracture. Electronically Signed   By: Marijo Conception M.D.   On: 07/27/2018 10:20   Xr Clavicle Left  Result Date: 07/22/2018 AP left shoulder reviewed.  Comminuted midshaft clavicle fracture is present with displacement of at least 1 shaft width.  Multiple fragments also present around the fracture site.  The shoulder is located.  No other  abnormalities present.   Disposition:   Discharge Instructions    Call MD / Call 911   Complete by:  As directed    If you experience chest pain or shortness of breath, CALL 911 and be transported to the hospital emergency room.  If you develope a fever above 101 F, pus (white drainage) or increased drainage or redness at the wound, or calf pain, call your surgeon's office.   Call MD / Call 911   Complete by:  As directed    If you experience chest pain or shortness of breath, CALL 911 and be transported to the hospital emergency room.  If you develope a fever above 101 F, pus (white drainage) or increased drainage or redness at the wound,  or calf pain, call your surgeon's office.   Constipation Prevention   Complete by:  As directed    Drink plenty of fluids.  Prune juice may be helpful.  You may use a stool softener, such as Colace (over the counter) 100 mg twice a day.  Use MiraLax (over the counter) for constipation as needed.   Constipation Prevention   Complete by:  As directed    Drink plenty of fluids.  Prune juice may be helpful.  You may use a stool softener, such as Colace (over the counter) 100 mg twice a day.  Use MiraLax (over the counter) for constipation as needed.   Diet - low sodium heart healthy   Complete by:  As directed    Diet - low sodium heart healthy   Complete by:  As directed    Discharge instructions   Complete by:  As directed    Keep arm in sling No lifting with left arm Ok to shower dressing waterproof   Discharge instructions   Complete by:  As directed    Stay in sling except for once a day elbow range of motion Ok to shower - dressing waterproof No lifting with left arm   Increase activity slowly as tolerated   Complete by:  As directed    Increase activity slowly as tolerated   Complete by:  As directed          Signed: Anderson Malta 07/30/2018, 8:28 AM

## 2018-08-04 ENCOUNTER — Telehealth: Payer: Self-pay | Admitting: Orthopedic Surgery

## 2018-08-04 ENCOUNTER — Ambulatory Visit (INDEPENDENT_AMBULATORY_CARE_PROVIDER_SITE_OTHER): Payer: BLUE CROSS/BLUE SHIELD | Admitting: Orthopedic Surgery

## 2018-08-04 ENCOUNTER — Encounter: Payer: Self-pay | Admitting: Orthopedic Surgery

## 2018-08-04 ENCOUNTER — Ambulatory Visit (INDEPENDENT_AMBULATORY_CARE_PROVIDER_SITE_OTHER): Payer: BLUE CROSS/BLUE SHIELD

## 2018-08-04 ENCOUNTER — Other Ambulatory Visit: Payer: Self-pay

## 2018-08-04 DIAGNOSIS — S4992XA Unspecified injury of left shoulder and upper arm, initial encounter: Secondary | ICD-10-CM | POA: Diagnosis not present

## 2018-08-04 MED ORDER — DOCUSATE SODIUM 100 MG PO CAPS: ORAL_CAPSULE | ORAL | 0 refills | Status: DC

## 2018-08-04 NOTE — Addendum Note (Signed)
Addended by: Laurann Montana on: 08/04/2018 10:20 AM   Modules accepted: Orders

## 2018-08-04 NOTE — Telephone Encounter (Signed)
When may she return to work? Please advise. Thanks.

## 2018-08-04 NOTE — Progress Notes (Signed)
   Post-Op Visit Note   Patient: Megan Hooper           Date of Birth: 09/01/1955           MRN: [id number] Visit Date: 08/04/2018 PCP: [name], MD   Assessment & Plan:  Chief Complaint:  Chief Complaint  Patient presents with  . Follow-up   Visit Diagnoses:  1. Injury of left clavicle, initial encounter     Plan: Patient presents now about a week out left clavicle open reduction internal fixation.  She has been doing well.  On exam she has good motor sensory function to the hand and the incision looks good.  Plan at this time is continue in the sling for 1 more week then she can come out of the sling but do not do any lifting with that left arm.  I will see her back in 2 weeks for repeat radiographs and clinical recheck.  Passive range of motion of the shoulder is good today.  Follow-Up Instructions: Return in about 2 weeks (around 08/18/2018).   Orders:  Orders Placed This Encounter  Procedures  . XR Clavicle Left   No orders of the defined types were placed in this encounter.   Imaging: Xr Clavicle Left  Result Date: 08/04/2018 AP left clavicle reviewed.  Plate fixation of clavicle comminuted fracture in good position alignment with no complicating features.  Comminution fragments noted around the plate.   PMFS History: Patient Active Problem List   Diagnosis Date Noted  . Nausea 07/27/2018  . Clavicle fracture, shaft 07/27/2018  . Osteopenia 12/20/2015  . Allergy to cats 12/19/2015  . Asthma, mild intermittent 12/19/2015  . Hyperlipidemia 09/19/2014  . Hot flashes 09/19/2014   Past Medical History:  Diagnosis Date  . Allergy   . Anemia   . Asthma   . Clavicle fracture    Left  . Dyslipidemia   . Leukopenia    mild  . Osteopenia   . Overactive bladder     Family History  Problem Relation Age of Onset  . Hyperlipidemia Mother   . Cancer Father     Past Surgical History:  Procedure Laterality Date  . ABDOMINAL HYSTERECTOMY    . ORIF  CLAVICULAR FRACTURE Left 07/27/2018   OPEN REDUCTION INTERNAL FIXATION (ORIF) LEFT CLAVICULAR FRACTURE AND ILIAC CREST BONE MARROW ASPIRATION FOR GRAFTING  . ORIF CLAVICULAR FRACTURE Left 07/27/2018   Procedure: OPEN REDUCTION INTERNAL FIXATION (ORIF) LEFT CLAVICULAR FRACTURE AND POSSIBLE BONE MARROW ASPIRATION FOR GRAFTING;  Surgeon: Meredith Pel, MD;  Location: [facility];  Service: Orthopedics;  Laterality: Left;   Social History   Occupational History  . Occupation: Pharmacist, hospital  Tobacco Use  . Smoking status: Former Smoker    Packs/day: 0.50    Years: 13.00    Pack years: 6.50    Types: Cigarettes    Start date: 05/26/1978    Last attempt to quit: 10/22/1991    Years since quitting: 26.8  . Smokeless tobacco: Never Used  . Tobacco comment: quit  13 years ago  Substance and Sexual Activity  . Alcohol use: No    Alcohol/week: 0.0 standard drinks  . Drug use: No  . Sexual activity: Not Currently

## 2018-08-04 NOTE — Telephone Encounter (Signed)
Pt called asking for a note to be faxed to her work saying she is okay to return.  fax # [phone]

## 2018-08-04 NOTE — Telephone Encounter (Signed)
faxed

## 2018-08-04 NOTE — Telephone Encounter (Signed)
Ok to rtw I in sling tomorrow

## 2018-08-18 ENCOUNTER — Ambulatory Visit (INDEPENDENT_AMBULATORY_CARE_PROVIDER_SITE_OTHER): Payer: BLUE CROSS/BLUE SHIELD

## 2018-08-18 ENCOUNTER — Ambulatory Visit (INDEPENDENT_AMBULATORY_CARE_PROVIDER_SITE_OTHER): Payer: BLUE CROSS/BLUE SHIELD | Admitting: Orthopedic Surgery

## 2018-08-18 ENCOUNTER — Other Ambulatory Visit: Payer: Self-pay

## 2018-08-18 ENCOUNTER — Encounter: Payer: Self-pay | Admitting: Orthopedic Surgery

## 2018-08-18 DIAGNOSIS — S42002A Fracture of unspecified part of left clavicle, initial encounter for closed fracture: Secondary | ICD-10-CM

## 2018-08-18 NOTE — Progress Notes (Signed)
Post-Op Visit Note   Patient: Megan Hooper           Date of Birth: 10/30/1955           MRN: [id number] Visit Date: 08/18/2018 PCP: [name], MD   Assessment & Plan:  Chief Complaint:  Chief Complaint  Patient presents with  . Post-op Follow-up    Clavicle ORIF 07/27/18   Visit Diagnoses:  1. Closed displaced fracture of left clavicle, unspecified part of clavicle, initial encounter     Plan: Patient presents now 3 weeks out left clavicle open reduction internal fixation.  She has been doing well.  On exam she has good range of motion of the shoulder and the incision looks intact.  Radiographs show no change in hardware position or lucency around the screws.  I think is okay for her to return to work at this time.  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  I would not want her doing it all the time but I think she could do it part of the day.  I will see her back in 4 weeks for repeat clinical check and x-rays.  Follow-Up Instructions: Return in about 4 weeks (around 09/15/2018).   Orders:  Orders Placed This Encounter  Procedures  . XR Clavicle Left   No orders of the defined types were placed in this encounter.   Imaging: Xr Clavicle Left  Result Date: 08/18/2018 2 views left clavicle reviewed.  Hardware fixation clavicle fracture appears to be in good position alignment with no complicating features.  Not much in the way of abundant callus formation noted within the fracture site.   PMFS History: Patient Active Problem List   Diagnosis Date Noted  . Nausea 07/27/2018  . Clavicle fracture, shaft 07/27/2018  . Osteopenia 12/20/2015  . Allergy to cats 12/19/2015  . Asthma, mild intermittent 12/19/2015  . Hyperlipidemia 09/19/2014  . Hot flashes 09/19/2014   Past Medical History:  Diagnosis Date  . Allergy   . Anemia   . Asthma   . Clavicle fracture    Left  .  Dyslipidemia   . Leukopenia    mild  . Osteopenia   . Overactive bladder     Family History  Problem Relation Age of Onset  . Hyperlipidemia Mother   . Cancer Father     Past Surgical History:  Procedure Laterality Date  . ABDOMINAL HYSTERECTOMY    . ORIF CLAVICULAR FRACTURE Left 07/27/2018   OPEN REDUCTION INTERNAL FIXATION (ORIF) LEFT CLAVICULAR FRACTURE AND ILIAC CREST BONE MARROW ASPIRATION FOR GRAFTING  . ORIF CLAVICULAR FRACTURE Left 07/27/2018   Procedure: OPEN REDUCTION INTERNAL FIXATION (ORIF) LEFT CLAVICULAR FRACTURE AND POSSIBLE BONE MARROW ASPIRATION FOR GRAFTING;  Surgeon: Meredith Pel, MD;  Location: [facility];  Service: Orthopedics;  Laterality: Left;   Social History   Occupational History  . Occupation: Pharmacist, hospital  Tobacco Use  . Smoking status: Former Smoker    Packs/day: 0.50    Years: 13.00    Pack years: 6.50    Types: Cigarettes    Start date: 05/26/1978    Last attempt to quit: 10/22/1991    Years since quitting: 26.8  . Smokeless tobacco: Never Used  . Tobacco comment: quit  13 years ago  Substance and Sexual Activity  . Alcohol use: No    Alcohol/week: 0.0 standard drinks  . Drug use:  No  . Sexual activity: Not Currently

## 2018-08-18 NOTE — Telephone Encounter (Signed)
Y as pain allows

## 2018-08-28 ENCOUNTER — Telehealth: Payer: Self-pay

## 2018-08-28 DIAGNOSIS — Z20822 Contact with and (suspected) exposure to covid-19: Secondary | ICD-10-CM

## 2018-08-28 NOTE — Telephone Encounter (Signed)
Phone call to pt.  Left voice message to call [phone], and speak with nurse re: recent office visit with [facility].

## 2018-08-29 NOTE — Telephone Encounter (Signed)
Pt called and was explaining the possible exposure to Covid 19 at office visit 08/18/18 at [facility]. Call was dropped. Called pt back and left message to call back. Order written.

## 2018-08-29 NOTE — Addendum Note (Signed)
Addended by: Carlisle Beers on: 08/29/2018 06:36 PM   Modules accepted: Orders

## 2018-08-30 ENCOUNTER — Encounter: Payer: Self-pay | Admitting: *Deleted

## 2018-08-30 NOTE — Telephone Encounter (Signed)
Patient returns call. Reported her visit at [facility] was over two weeks ago and she has not developed any symptoms. Denies all symptoms. Does not wish to have Covid19 testing at this time.

## 2018-08-30 NOTE — Telephone Encounter (Signed)
This encounter was created in error - please disregard.

## 2018-09-13 ENCOUNTER — Ambulatory Visit (INDEPENDENT_AMBULATORY_CARE_PROVIDER_SITE_OTHER): Payer: BC Managed Care – PPO | Admitting: Orthopedic Surgery

## 2018-09-13 ENCOUNTER — Encounter: Payer: Self-pay | Admitting: Orthopedic Surgery

## 2018-09-13 ENCOUNTER — Other Ambulatory Visit: Payer: Self-pay

## 2018-09-13 ENCOUNTER — Ambulatory Visit (INDEPENDENT_AMBULATORY_CARE_PROVIDER_SITE_OTHER): Payer: BC Managed Care – PPO

## 2018-09-13 DIAGNOSIS — S42002A Fracture of unspecified part of left clavicle, initial encounter for closed fracture: Secondary | ICD-10-CM | POA: Diagnosis not present

## 2018-09-14 ENCOUNTER — Encounter: Payer: Self-pay | Admitting: Orthopedic Surgery

## 2018-09-14 NOTE — Progress Notes (Signed)
   Post-Op Visit Note   Patient: Megan Hooper           Date of Birth: [birth year]           MRN: [id number] Visit Date: 09/13/2018 PCP: [name], MD   Assessment & Plan:  Chief Complaint:  Chief Complaint  Patient presents with  . Left Shoulder - Follow-up   Visit Diagnoses:  1. Closed displaced fracture of left clavicle, unspecified part of clavicle, initial encounter     Plan: Patient is now about 6 weeks out left clavicle fracture fixation.  Is been doing reasonably well but overdid it 1 day and had some pain.  On exam she has excellent range of motion and no tenderness or warmth around the incision.  Radiographs show some consolidation of the fracture but is not yet complete.  No lucency around the screws laterally or medially.  Plan is to continue with range of motion but no lifting more than 5 pounds and I would avoid overhead resistive activities.  Come back in 6 weeks for repeat radiographs and likely release at that time.  Follow-Up Instructions: Return in about 6 weeks (around 10/25/2018).   Orders:  Orders Placed This Encounter  Procedures  . XR Clavicle Left   No orders of the defined types were placed in this encounter.   Imaging: No results found.  PMFS History: Patient Active Problem List   Diagnosis Date Noted  . Nausea 07/27/2018  . Clavicle fracture, shaft 07/27/2018  . Osteopenia 12/20/2015  . Allergy to cats 12/19/2015  . Asthma, mild intermittent 12/19/2015  . Hyperlipidemia 09/19/2014  . Hot flashes 09/19/2014   Past Medical History:  Diagnosis Date  . Allergy   . Anemia   . Asthma   . Clavicle fracture    Left  . Dyslipidemia   . Leukopenia    mild  . Osteopenia   . Overactive bladder     Family History  Problem Relation Age of Onset  . Hyperlipidemia Mother   . Cancer Father     Past Surgical History:  Procedure Laterality Date  . ABDOMINAL HYSTERECTOMY    . ORIF CLAVICULAR FRACTURE Left 07/27/2018   OPEN REDUCTION  INTERNAL FIXATION (ORIF) LEFT CLAVICULAR FRACTURE AND ILIAC CREST BONE MARROW ASPIRATION FOR GRAFTING  . ORIF CLAVICULAR FRACTURE Left 07/27/2018   Procedure: OPEN REDUCTION INTERNAL FIXATION (ORIF) LEFT CLAVICULAR FRACTURE AND POSSIBLE BONE MARROW ASPIRATION FOR GRAFTING;  Surgeon: Meredith Pel, MD;  Location: [facility];  Service: Orthopedics;  Laterality: Left;   Social History   Occupational History  . Occupation: Pharmacist, hospital  Tobacco Use  . Smoking status: Former Smoker    Packs/day: 0.50    Years: 13.00    Pack years: 6.50    Types: Cigarettes    Start date: 05/26/1978    Quit date: 10/22/1991    Years since quitting: 26.9  . Smokeless tobacco: Never Used  . Tobacco comment: quit  13 years ago  Substance and Sexual Activity  . Alcohol use: No    Alcohol/week: 0.0 standard drinks  . Drug use: No  . Sexual activity: Not Currently

## 2018-09-15 ENCOUNTER — Ambulatory Visit: Payer: BLUE CROSS/BLUE SHIELD | Admitting: Orthopedic Surgery

## 2018-10-27 ENCOUNTER — Other Ambulatory Visit: Payer: Self-pay

## 2018-10-27 ENCOUNTER — Ambulatory Visit (INDEPENDENT_AMBULATORY_CARE_PROVIDER_SITE_OTHER): Payer: BC Managed Care – PPO

## 2018-10-27 ENCOUNTER — Encounter: Payer: Self-pay | Admitting: Orthopedic Surgery

## 2018-10-27 ENCOUNTER — Ambulatory Visit (INDEPENDENT_AMBULATORY_CARE_PROVIDER_SITE_OTHER): Payer: BC Managed Care – PPO | Admitting: Orthopedic Surgery

## 2018-10-27 DIAGNOSIS — S42002A Fracture of unspecified part of left clavicle, initial encounter for closed fracture: Secondary | ICD-10-CM

## 2018-10-27 DIAGNOSIS — M7502 Adhesive capsulitis of left shoulder: Secondary | ICD-10-CM

## 2018-10-27 NOTE — Progress Notes (Signed)
Office Visit Note   Patient: Megan Hooper           Date of Birth: Jan 03, 1956           MRN: [id number] Visit Date: 10/27/2018 Requested by: [name], MD [address] [place],  [place] [address] PCP: [name], MD  Subjective: Chief Complaint  Patient presents with  . Follow-up    HPI: Megan Hooper is a 63 y.o. female who presents to the office complaining of L shoulder pain.  Patient has a history of a left clavicle ORIF on 07/27/2018.  Today she complains of pain in the left shoulder that bothers her at rest but is worse with range of motion of the shoulder.  She has been taking ibuprofen 2-3 times a week.  Her main complaint today is stiffness but she also notes weakness and pain.  The pain does not wake her up at night.  She denies any injury to the shoulder.  No history of diabetes or thyroid disorders.  She has never had a shoulder injection before.              ROS: All systems reviewed are negative as they relate to the chief complaint within the history of present illness.  Patient denies  fevers or chills.   Assessment & Plan: Visit Diagnoses:  1. Closed displaced fracture of left clavicle, unspecified part of clavicle, initial encounter     Plan: Patient is a 63 year old female who returns 3 months out from ORIF of her left clavicle.  X-rays today reveal that her clavicle is healed well without loosening of the plate or screws.  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  She has significantly reduced passive range of motion of the left shoulder when compared to the contralateral side, especially with external rotation.  Her pain is worse at the ends of her range of motion.  Discussed options available to the patient today.  Offered an injection but patient declined for now.  Patient will work on a home exercise program as instructed by the office.  She will return in 6 weeks and if no better we will consider a shoulder injection at  that time.  Follow-Up Instructions: No follow-ups on file.   Orders:  Orders Placed This Encounter  Procedures  . XR Clavicle Left   No orders of the defined types were placed in this encounter.     Procedures: No procedures performed   Clinical Data: No additional findings.  Objective: Vital Signs: There were no vitals taken for this visit.  Physical Exam:   Constitutional: Patient appears well-developed HEENT:  Head: Normocephalic Eyes:EOM are normal Neck: Normal range of motion Cardiovascular: Normal rate Pulmonary/chest: Effort normal Neurologic: Patient is alert Skin: Skin is warm Psychiatric: Patient has normal mood and affect    Ortho Exam:  Left shoulder Exam Well-healed scar over the left clavicle No tenderness over the length of the left clavicle Unable to fully forward flex and abduct shoulder overhead.  Limited to about 60 degrees of abduction and 60 degrees of forward flexion. Significant loss of ER relative to the other shoulder.   No TTP over the [facility] joint or bicipital groove Good subscapularis, supraspinatus, and infraspinatus strength with pain Positive Hawkins impingement 5/5 grip strength, forearm pronation/supination, and bicep strength   Specialty Comments:  No specialty comments available.  Imaging: No results found.   PMFS History: Patient Active Problem List   Diagnosis  Date Noted  . Nausea 07/27/2018  . Clavicle fracture, shaft 07/27/2018  . Osteopenia 12/20/2015  . Allergy to cats 12/19/2015  . Asthma, mild intermittent 12/19/2015  . Hyperlipidemia 09/19/2014  . Hot flashes 09/19/2014   Past Medical History:  Diagnosis Date  . Allergy   . Anemia   . Asthma   . Clavicle fracture    Left  . Dyslipidemia   . Leukopenia    mild  . Osteopenia   . Overactive bladder     Family History  Problem Relation Age of Onset  . Hyperlipidemia Mother   . Cancer Father     Past Surgical History:  Procedure Laterality Date   . ABDOMINAL HYSTERECTOMY    . ORIF CLAVICULAR FRACTURE Left 07/27/2018   OPEN REDUCTION INTERNAL FIXATION (ORIF) LEFT CLAVICULAR FRACTURE AND ILIAC CREST BONE MARROW ASPIRATION FOR GRAFTING  . ORIF CLAVICULAR FRACTURE Left 07/27/2018   Procedure: OPEN REDUCTION INTERNAL FIXATION (ORIF) LEFT CLAVICULAR FRACTURE AND POSSIBLE BONE MARROW ASPIRATION FOR GRAFTING;  Surgeon: Meredith Pel, MD;  Location: [facility];  Service: Orthopedics;  Laterality: Left;   Social History   Occupational History  . Occupation: Pharmacist, hospital  Tobacco Use  . Smoking status: Former Smoker    Packs/day: 0.50    Years: 13.00    Pack years: 6.50    Types: Cigarettes    Start date: 05/26/1978    Quit date: 10/22/1991    Years since quitting: 27.0  . Smokeless tobacco: Never Used  . Tobacco comment: quit  13 years ago  Substance and Sexual Activity  . Alcohol use: No    Alcohol/week: 0.0 standard drinks  . Drug use: No  . Sexual activity: Not Currently

## 2018-11-23 DIAGNOSIS — H25043 Posterior subcapsular polar age-related cataract, bilateral: Secondary | ICD-10-CM | POA: Diagnosis not present

## 2018-11-23 DIAGNOSIS — H2512 Age-related nuclear cataract, left eye: Secondary | ICD-10-CM | POA: Diagnosis not present

## 2018-11-23 DIAGNOSIS — H18413 Arcus senilis, bilateral: Secondary | ICD-10-CM | POA: Diagnosis not present

## 2018-11-23 DIAGNOSIS — H2513 Age-related nuclear cataract, bilateral: Secondary | ICD-10-CM | POA: Diagnosis not present

## 2018-11-23 DIAGNOSIS — H25013 Cortical age-related cataract, bilateral: Secondary | ICD-10-CM | POA: Diagnosis not present

## 2018-12-16 ENCOUNTER — Telehealth: Payer: Self-pay | Admitting: Family Medicine

## 2018-12-16 NOTE — Telephone Encounter (Signed)
Patient would like some orders for blood work put in do to she has been dizzy and not feeling well, states something is not right and would like a phone call from a nurse ASAP

## 2018-12-17 ENCOUNTER — Telehealth: Payer: Self-pay | Admitting: Family Medicine

## 2018-12-17 NOTE — Telephone Encounter (Signed)
Called and lvmtcb and schedule OV

## 2018-12-17 NOTE — Telephone Encounter (Signed)
Please call and schedule pt an OV with Dr.Greene

## 2018-12-17 NOTE — Telephone Encounter (Signed)
Called and lvmtcb and schedule OV with Dr. [name] for blood work. Orders not in

## 2018-12-20 ENCOUNTER — Ambulatory Visit (INDEPENDENT_AMBULATORY_CARE_PROVIDER_SITE_OTHER): Payer: BC Managed Care – PPO | Admitting: Orthopedic Surgery

## 2018-12-20 DIAGNOSIS — M7502 Adhesive capsulitis of left shoulder: Secondary | ICD-10-CM

## 2018-12-21 ENCOUNTER — Telehealth: Payer: Self-pay | Admitting: Orthopedic Surgery

## 2018-12-21 DIAGNOSIS — S42002A Fracture of unspecified part of left clavicle, initial encounter for closed fracture: Secondary | ICD-10-CM

## 2018-12-21 DIAGNOSIS — M7502 Adhesive capsulitis of left shoulder: Secondary | ICD-10-CM

## 2018-12-21 NOTE — Telephone Encounter (Signed)
Patient called. Would like an order for PT sent to [facility] on [facility]. Call back number is [phone]

## 2018-12-21 NOTE — Telephone Encounter (Signed)
Please advise. Protivin for referral? Patient seen yesterday. If ok, what would you like to refer for?

## 2018-12-21 NOTE — Telephone Encounter (Signed)
Okay for active assisted and passive range of motion left shoulder as tolerated with no restriction on range of motion 3 times a week for 6 weeks thanks modalities okay also.

## 2018-12-22 ENCOUNTER — Ambulatory Visit (INDEPENDENT_AMBULATORY_CARE_PROVIDER_SITE_OTHER): Payer: BC Managed Care – PPO | Admitting: Family Medicine

## 2018-12-22 ENCOUNTER — Other Ambulatory Visit: Payer: Self-pay

## 2018-12-22 ENCOUNTER — Encounter: Payer: Self-pay | Admitting: Family Medicine

## 2018-12-22 VITALS — BP 136/78 | HR 88 | Temp 98.7°F | Wt 157.0 lb

## 2018-12-22 DIAGNOSIS — M7502 Adhesive capsulitis of left shoulder: Secondary | ICD-10-CM

## 2018-12-22 DIAGNOSIS — R42 Dizziness and giddiness: Secondary | ICD-10-CM | POA: Diagnosis not present

## 2018-12-22 DIAGNOSIS — F4321 Adjustment disorder with depressed mood: Secondary | ICD-10-CM

## 2018-12-22 LAB — POCT CBC
Granulocyte percent: 67 %G (ref 37–80)
HCT, POC: 43.3 % — AB (ref 29–41)
Hemoglobin: 14.4 g/dL (ref 11–14.6)
Lymph, poc: 1.4 (ref 0.6–3.4)
MCH, POC: 28 pg (ref 27–31.2)
MCHC: 33.2 g/dL (ref 31.8–35.4)
MCV: 84.4 fL — AB (ref 76–111)
MID (cbc): 0.3 (ref 0–0.9)
MPV: 7.7 fL (ref 0–99.8)
POC Granulocyte: 3.4 (ref 2–6.9)
POC LYMPH PERCENT: 27.1 %L (ref 10–50)
POC MID %: 5.9 %M (ref 0–12)
Platelet Count, POC: 313 10*3/uL (ref 142–424)
RBC: 5.13 M/uL (ref 4.04–5.48)
RDW, POC: 13.6 %
WBC: 5.1 10*3/uL (ref 4.6–10.2)

## 2018-12-22 LAB — GLUCOSE, POCT (MANUAL RESULT ENTRY): POC Glucose: 100 mg/dl — AB (ref 70–99)

## 2018-12-22 MED ORDER — MECLIZINE HCL 25 MG PO TABS: 25.0000 mg | ORAL_TABLET | Freq: Three times a day (TID) | ORAL | 1 refills | Status: DC | PRN

## 2018-12-22 NOTE — Progress Notes (Signed)
Subjective:    Patient ID: Megan Hooper, female    DOB: [birth year], 63 y.o.   MRN: [id number]  HPI Megan Hooper is a 63 y.o. female Presents today for: Chief Complaint  Patient presents with   Dizziness    dizziness has been going on for 2 1/2 weeks now. Dizziness seems to be getting worse. PHQ9=10   disability    patient also would like to discuss getting on short term disability for frozen shoulder/dizziness/ two eye surgeries coming up in Oct   Dizziness: Noticed past 2.5 weeks.  Noticed getting out of bed initially. No syncope, no falls.  Swimming feeling, spinning sensation with moving head at night. Worse with quick movements or standing up quickly. Looking down at times.  Flushed at times. No never.  No chest pain/palpitations.  No vomiting. Some nausea at times. Feels present at multiple times per day.   No BP meds or narcotic pain meds. Occasional ibuprofen. No dark stools, no hx of PUD.  Min HA, slight pressure,no sinus pressure/congestion. Not worst HA of life.  Tx: none.   Depression screen [facility] 2/9 12/22/2018 07/16/2018 03/25/2018 08/26/2017 08/14/2017  Decreased Interest 1 0 0 0 0  Down, Depressed, Hopeless 1 0 0 0 0  PHQ - 2 Score 2 0 0 0 0  Altered sleeping 1 - - - -  Tired, decreased energy 3 - - - -  Change in appetite 0 - - - -  Feeling bad or failure about yourself  3 - - - -  Trouble concentrating 1 - - - -  Moving slowly or fidgety/restless 0 - - - -  Suicidal thoughts 0 - - - -  PHQ-9 Score 10 - - - -  Difficult doing work/chores Somewhat difficult - - - -  past few weeks has been feeling depressed. 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 Only there for about a week - unable to see out of eye, multiple computer programs. Planned on more training than what was promised. Unable to return to same nanny job that she left.  No prior depression treatment. Spoke to counselor when children were younger - 5 yrs ago. Plans to call.  No SI.    L  shoulder pain: Followed by Dr. Marlou Sa at [facility]. Left clavicle ORIF in April.  Diagnosed with frozen shoulder in July.  Referred to physical therapy yesterday.  Last office visit 9/21 - had steroid injection.  Prior working 60hrs per week, unable to do now. In between nanny jobs. Unable to work as lifting, overhead work, frequent use of arm needed.   Cataract surgeries scheduled 10/2, and then a few weeks later - will need lifting restrictions for 1 week.   Patient Active Problem List   Diagnosis Date Noted   Nausea 07/27/2018   Clavicle fracture, shaft 07/27/2018   Osteopenia 12/20/2015   Allergy to cats 12/19/2015   Asthma, mild intermittent 12/19/2015   Hyperlipidemia 09/19/2014   Hot flashes 09/19/2014   Past Medical History:  Diagnosis Date   Allergy    Anemia    Asthma    Clavicle fracture    Left   Dyslipidemia    Leukopenia    mild   Osteopenia    Overactive bladder    Past Surgical History:  Procedure Laterality Date   ABDOMINAL HYSTERECTOMY     ORIF CLAVICULAR FRACTURE Left 07/27/2018   OPEN REDUCTION INTERNAL FIXATION (ORIF) LEFT CLAVICULAR FRACTURE AND ILIAC CREST BONE  MARROW ASPIRATION FOR GRAFTING   ORIF CLAVICULAR FRACTURE Left 07/27/2018   Procedure: OPEN REDUCTION INTERNAL FIXATION (ORIF) LEFT CLAVICULAR FRACTURE AND POSSIBLE BONE MARROW ASPIRATION FOR GRAFTING;  Surgeon: Meredith Pel, MD;  Location: [facility];  Service: Orthopedics;  Laterality: Left;   Allergies  Allergen Reactions   Codeine Nausea And Vomiting   Prior to Admission medications   Medication Sig Start Date End Date Taking? Authorizing Provider  albuterol (PROVENTIL HFA;VENTOLIN HFA) 108 (90 Base) MCG/ACT inhaler Inhale 1-2 puffs into the lungs every 4 (four) hours as needed for wheezing or shortness of breath. 08/26/17  Yes McVey, Gelene Mink, PA-C  ibuprofen (ADVIL) 800 MG tablet Take 1 tablet (800 mg total) by mouth 2 (two) times daily as  needed. 07/22/18  Yes Meredith Pel, MD  mirabegron ER (MYRBETRIQ) 50 MG TB24 tablet Take 50 mg by mouth daily.   Yes [provider]   Social History   Socioeconomic History   Marital status: Divorced    Spouse name: Not on file   Number of children: Not on file   Years of education: Not on file   Highest education level: Not on file  Occupational History   Occupation: Pharmacist, hospital  Social Needs   Financial resource strain: Not on file   Food insecurity    Worry: Not on file    Inability: Not on file   Transportation needs    Medical: Not on file    Non-medical: Not on file  Tobacco Use   Smoking status: Former Smoker    Packs/day: 0.50    Years: 13.00    Pack years: 6.50    Types: Cigarettes    Start date: 05/26/1978    Quit date: 10/22/1991    Years since quitting: 27.1   Smokeless tobacco: Never Used   Tobacco comment: quit  13 years ago  Substance and Sexual Activity   Alcohol use: No    Alcohol/week: 0.0 standard drinks   Drug use: No   Sexual activity: Not Currently  Lifestyle   Physical activity    Days per week: Not on file    Minutes per session: Not on file   Stress: Not on file  Relationships   Social connections    Talks on phone: Not on file    Gets together: Not on file    Attends religious service: Not on file    Active member of club or organization: Not on file    Attends meetings of clubs or organizations: Not on file    Relationship status: Not on file   Intimate partner violence    Fear of current or ex partner: Not on file    Emotionally abused: Not on file    Physically abused: Not on file    Forced sexual activity: Not on file  Other Topics Concern   Not on file  Social History Narrative   Not on file    Review of Systems  Per [facility]Pi.     Objective:   Physical Exam Vitals signs reviewed.  Constitutional:      General: She is not in acute distress.    Appearance: She is well-developed.  HENT:      Head: Normocephalic and atraumatic.  Eyes:     Extraocular Movements:     Right eye: Nystagmus (1 beat horizontal nystagmus with reproduction of dizziness sitting up from supine position.) present.     Left eye: Nystagmus present.  Cardiovascular:     Rate and  Rhythm: Normal rate.  Pulmonary:     Effort: Pulmonary effort is normal.  Neurological:     Mental Status: She is alert and oriented to person, place, and time.     GCS: GCS eye subscore is 4. GCS verbal subscore is 5. GCS motor subscore is 6.     Cranial Nerves: No cranial nerve deficit, dysarthria or facial asymmetry.     Motor: No weakness, tremor or pronator drift.     Coordination: Coordination is intact. Finger-Nose-Finger Test and Heel to [place] Test normal.     Gait: Gait is intact.     Comments: Nonfocal exam.    Vitals:   12/22/18 0906  BP: 136/78  Pulse: 88  Temp: 98.7 F (37.1 C)  TempSrc: Oral  SpO2: 98%  Weight: 157 lb (71.2 kg)    EKG; SR, rate 75, no acute findings.   Results for orders placed or performed in visit on 12/22/18  POCT glucose (manual entry)  Result Value Ref Range   POC Glucose 100 (A) 70 - 99 mg/dl        Assessment & Plan:    Megan Hooper is a 63 y.o. female Dizziness - Plan: POCT CBC, POCT glucose (manual entry), Basic metabolic panel, TSH, EKG [id number] Vertigo - Plan: meclizine (ANTIVERT) 25 MG tablet  -Exam/history suspicious for peripheral vertigo, nonfocal exam.  Trial of meclizine with potential side effects discussed, home Epley maneuver discussed with potential risks.  Consider ENT eval if persistent/RTC precautions if worsening  Adhesive capsulitis of left shoulder  -Continue follow-up with surgeon, including discussions of possible disability paperwork, but did provide number for Social Security office if needed as she is not sure if she has disability coverage at this time.  Adjustment disorder with depressed mood  -With change in job, and issues with her  shoulder/eyes.  Handout given, recommended follow-up with previous counselor, recheck 2 weeks  Meds ordered this encounter  Medications   meclizine (ANTIVERT) 25 MG tablet    Sig: Take 1 tablet (25 mg total) by mouth 3 (three) times daily as needed for dizziness.    Dispense:  30 tablet    Refill:  1   Patient Instructions   Dizziness appears to be due to vertigo.  Try meclizine up to 3 times per day, see information below including info for Epley maneuver if you would like to try that at home.   Follow-up in 2 weeks, but if symptoms are worsening during that time be seen here or emergency room if needed.   I think you have an adjustment disorder with all that is going on now. I would recommend calling your prior counselor. See other info below.   I recommend discussing disability with social security office if you do not have short term disability coverage with your insurance.  Your orthopaedist and ophthalmologist may need to complete paperwork as well.  [email or web address]  [place] [employer] Security Administration Address: [address], [place], [place] [address]  Phone: [phone]   Vertigo Vertigo is the feeling that you or your surroundings are moving when they are not. This feeling can come and go at any time. Vertigo often goes away on its own. Vertigo can be dangerous if it occurs while you are doing something that could endanger you or others, such as driving or operating machinery. Your health care provider will do tests to determine the cause of your vertigo. Tests will also help your health care provider decide how best to treat  your condition. Follow these instructions at home: Eating and drinking      Drink enough fluid to keep your urine pale yellow.  Do not drink alcohol. Activity  Return to your normal activities as told by your health care provider. Ask your health care provider what activities are safe for you.  In the morning, first sit up  on the side of the bed. When you feel okay, stand slowly while you hold onto something until you know that your balance is fine.  Move slowly. Avoid sudden body or head movements or certain positions, as told by your health care provider.  If you have trouble walking or keeping your balance, try using a cane for stability. If you feel dizzy or unstable, sit down right away.  Avoid doing any tasks that would cause danger to you or others if vertigo occurs.  Avoid bending down if you feel dizzy. Place items in your home so that they are easy for you to reach without leaning over.  Do not drive or use heavy machinery if you feel dizzy. General instructions  Take over-the-counter and prescription medicines only as told by your health care provider.  Keep all follow-up visits as told by your health care provider. This is important. Contact a health care provider if:  Your medicines do not relieve your vertigo or they make it worse.  You have a fever.  Your condition gets worse or you develop new symptoms.  Your family or friends notice any behavioral changes.  Your nausea or vomiting gets worse.  You have numbness or a prickling and tingling sensation in part of your body. Get help right away if you:  Have difficulty moving or speaking.  Are always dizzy.  Faint.  Develop severe headaches.  Have weakness in your hands, arms, or legs.  Have changes in your hearing or vision.  Develop a stiff neck.  Develop sensitivity to light. Summary  Vertigo is the feeling that you or your surroundings are moving when they are not.  Your health care provider will do tests to determine the cause of your vertigo.  Follow instructions for home care. You may be told to avoid certain tasks, positions, or movements.  Contact a health care provider if your medicines do not relieve your symptoms, or if you have a fever, nausea, vomiting, or changes in behavior.  Get help right away if you  have severe headaches or difficulty speaking, or you develop hearing or vision problems. This information is not intended to replace advice given to you by your health care provider. Make sure you discuss any questions you have with your health care provider. Document Released: 12/25/2004 Document Revised: 02/08/2018 Document Reviewed: 02/08/2018 Elsevier Patient Education  2020 [employer].  How to Perform the Epley Maneuver The Epley maneuver is an exercise that relieves symptoms of vertigo. Vertigo is the feeling that you or your surroundings are moving when they are not. When you feel vertigo, you may feel like the room is spinning and have trouble walking. Dizziness is a little different than vertigo. When you are dizzy, you may feel unsteady or light-headed. You can do this maneuver at home whenever you have symptoms of vertigo. You can do it up to 3 times a day until your symptoms go away. Even though the Epley maneuver may relieve your vertigo for a few weeks, it is possible that your symptoms will return. This maneuver relieves vertigo, but it does not relieve dizziness. What are the risks?  If it is done correctly, the Epley maneuver is considered safe. Sometimes it can lead to dizziness or nausea that goes away after a short time. If you develop other symptoms, such as changes in vision, weakness, or numbness, stop doing the maneuver and call your health care provider. How to perform the Epley maneuver 1. Sit on the edge of a bed or table with your back straight and your legs extended or hanging over the edge of the bed or table. 2. Turn your head halfway toward the affected ear or side. 3. Lie backward quickly with your head turned until you are lying flat on your back. You may want to position a pillow under your shoulders. 4. Hold this position for 30 seconds. You may experience an attack of vertigo. This is normal. 5. Turn your head to the opposite direction until your unaffected ear  is facing the floor. 6. Hold this position for 30 seconds. You may experience an attack of vertigo. This is normal. Hold this position until the vertigo stops. 7. Turn your whole body to the same side as your head. Hold for another 30 seconds. 8. Sit back up. You can repeat this exercise up to 3 times a day. Follow these instructions at home:  After doing the Epley maneuver, you can return to your normal activities.  Ask your health care provider if there is anything you should do at home to prevent vertigo. He or she may recommend that you: ? Keep your head raised (elevated) with two or more pillows while you sleep. ? Do not sleep on the side of your affected ear. ? Get up slowly from bed. ? Avoid sudden movements during the day. ? Avoid extreme head movement, like looking up or bending over. Contact a health care provider if:  Your vertigo gets worse.  You have other symptoms, including: ? Nausea. ? Vomiting. ? Headache. Get help right away if:  You have vision changes.  You have a severe or worsening headache or neck pain.  You cannot stop vomiting.  You have new numbness or weakness in any part of your body. Summary  Vertigo is the feeling that you or your surroundings are moving when they are not.  The Epley maneuver is an exercise that relieves symptoms of vertigo.  If the Epley maneuver is done correctly, it is considered safe. You can do it up to 3 times a day. This information is not intended to replace advice given to you by your health care provider. Make sure you discuss any questions you have with your health care provider. Document Released: 03/22/2013 Document Revised: 02/27/2017 Document Reviewed: 02/05/2016 Elsevier Patient Education  [place].   Adjustment Disorder, Adult Adjustment disorder is a group of symptoms that can develop after a stressful life event, such as the loss of a job or serious physical illness. The symptoms can affect how  you feel, think, and act. They may interfere with your relationships. Adjustment disorder increases your risk of suicide and substance abuse. If this disorder is not managed early, it can develop into a more serious condition, such as major depressive disorder or post-traumatic stress disorder. What are the causes? This condition happens when you have trouble recovering from or coping with a stressful life event. What increases the risk? You are more likely to develop this condition if:  You have had depression or anxiety.  You are being treated for a long-term (chronic) illness.  You are being treated for an illness  that cannot be cured (terminal illness).  You have a family history of mental illness. What are the signs or symptoms? Symptoms of this condition include:  Extreme trouble doing daily tasks, such as going to work.  Sadness, depression, or crying spells.  Worrying a lot.  Loss of enjoyment.  Change in appetite or weight.  Feelings of loss or hopelessness.  Thoughts of suicide.  Anxiety, worry, or nervousness.  Trouble sleeping.  Avoiding family and friends.  Fighting or vandalism.  Complaining of feeling sick without being ill.  Feeling dazed or disconnected.  Nightmares.  Trouble sleeping.  Irritability.  Reckless driving.  Poor work Systems analyst.  Ignoring bills. Symptoms of this condition start within three months of the stressful event. They do not last more than six months, unless the stressful circumstances last longer. Normal grieving after the death of a loved one is not a symptom of this condition. How is this diagnosed? To diagnose this condition, your health care provider will ask about what has happened in your life and how it has affected you. He or she may also ask about your medical history and your use of medicines, alcohol, and other substances. Your health care provider may do a physical exam and order lab tests or other studies.  You may be referred to a mental health specialist. How is this treated? Treatment options for this condition include:  Counseling or talk therapy. Talk therapy is usually provided by mental health specialists.  Medicines. Certain medicines may help with depression, anxiety, and sleep.  Support groups. These offer emotional support, advice, and guidance. They are made up of people who have had similar experiences.  Observation and time. This is sometimes called "watchful waiting." In this treatment, health care providers monitor your health and behavior without other treatment. Adjustment disorder sometimes gets better on its own with time. Follow these instructions at home:  Take over-the-counter and prescription medicines only as told by your health care provider.  Keep all follow-up visits as told by your health care provider. This is important. Contact a health care provider if:  Your symptoms do not improve in six months.  Your symptoms get worse. Get help right away if:  You have serious thoughts about hurting yourself or someone else. If you ever feel like you may hurt yourself or others, or have thoughts about taking your own life, get help right away. You can go to your nearest emergency department or call:  Your local emergency services (911 in the [place]  A suicide crisis helpline, such as the [place] at 941-369-2591. This is open 24 hours a day. Summary  Adjustment disorder is a group of symptoms that can develop after a stressful life event, such as the loss of a job or serious physical illness. The symptoms can affect how you feel, think, and act. They may interfere with your relationships.  Symptoms of this condition start within three months of the stressful event. They do not last more than six months, unless the stressful circumstances last longer.  Treatment may include talk therapy, medicines, participation in a support group, or  observation to see if symptoms improve.  Contact your health care provider if your symptoms get worse or do not improve in six months.  If you ever feel like you may hurt yourself or others, or have thoughts about taking your own life, get help right away. This information is not intended to replace advice given to you by your health care provider.  Make sure you discuss any questions you have with your health care provider. Document Released: 11/19/2005 Document Revised: 02/27/2017 Document Reviewed: 05/16/2016 Elsevier Patient Education  [employer].   If you have lab work done today you will be contacted with your lab results within the next 2 weeks.  If you have not heard from [place] then please contact us. The fastest way to get your results is to register for My Chart.   IF you received an x-ray today, you will receive an invoice from [facility]. Please contact [facility] at [phone] with questions or concerns regarding your invoice.   IF you received labwork today, you will receive an invoice from [place]. Please contact [facility] at [phone] with questions or concerns regarding your invoice.   Our billing staff will not be able to assist you with questions regarding bills from these companies.  You will be contacted with the lab results as soon as they are available. The fastest way to get your results is to activate your My Chart account. Instructions are located on the last page of this paperwork. If you have not heard from [place] regarding the results in 2 weeks, please contact this office.       Signed,   [name], MD [facility].  12/22/18 9:58 PM

## 2018-12-22 NOTE — Patient Instructions (Addendum)
Dizziness appears to be due to vertigo.  Try meclizine up to 3 times per day, see information below including info for Epley maneuver if you would like to try that at home.   Follow-up in 2 weeks, but if symptoms are worsening during that time be seen here or emergency room if needed.   I think you have an adjustment disorder with all that is going on now. I would recommend calling your prior counselor. See other info below.   I recommend discussing disability with social security office if you do not have short term disability coverage with your insurance.  Your orthopaedist and ophthalmologist may need to complete paperwork as well.  [email or web address]  [place] [employer] Address: [address], [place], [place] [address]  Phone: [phone]   Vertigo Vertigo is the feeling that you or your surroundings are moving when they are not. This feeling can come and go at any time. Vertigo often goes away on its own. Vertigo can be dangerous if it occurs while you are doing something that could endanger you or others, such as driving or operating machinery. Your health care provider will do tests to determine the cause of your vertigo. Tests will also help your health care provider decide how best to treat your condition. Follow these instructions at home: Eating and drinking      Drink enough fluid to keep your urine pale yellow.  Do not drink alcohol. Activity  Return to your normal activities as told by your health care provider. Ask your health care provider what activities are safe for you.  In the morning, first sit up on the side of the bed. When you feel okay, stand slowly while you hold onto something until you know that your balance is fine.  Move slowly. Avoid sudden body or head movements or certain positions, as told by your health care provider.  If you have trouble walking or keeping your balance, try using a cane for stability. If you  feel dizzy or unstable, sit down right away.  Avoid doing any tasks that would cause danger to you or others if vertigo occurs.  Avoid bending down if you feel dizzy. Place items in your home so that they are easy for you to reach without leaning over.  Do not drive or use heavy machinery if you feel dizzy. General instructions  Take over-the-counter and prescription medicines only as told by your health care provider.  Keep all follow-up visits as told by your health care provider. This is important. Contact a health care provider if:  Your medicines do not relieve your vertigo or they make it worse.  You have a fever.  Your condition gets worse or you develop new symptoms.  Your family or friends notice any behavioral changes.  Your nausea or vomiting gets worse.  You have numbness or a prickling and tingling sensation in part of your body. Get help right away if you:  Have difficulty moving or speaking.  Are always dizzy.  Faint.  Develop severe headaches.  Have weakness in your hands, arms, or legs.  Have changes in your hearing or vision.  Develop a stiff neck.  Develop sensitivity to light. Summary  Vertigo is the feeling that you or your surroundings are moving when they are not.  Your health care provider will do tests to determine the cause of your vertigo.  Follow instructions for home care. You may be told to avoid certain tasks, positions, or movements.  Contact a health care provider if your medicines do not relieve your symptoms, or if you have a fever, nausea, vomiting, or changes in behavior.  Get help right away if you have severe headaches or difficulty speaking, or you develop hearing or vision problems. This information is not intended to replace advice given to you by your health care provider. Make sure you discuss any questions you have with your health care provider. Document Released: 12/25/2004 Document Revised: 02/08/2018 Document  Reviewed: 02/08/2018 Elsevier Patient Education  2020 [employer].  How to Perform the Epley Maneuver The Epley maneuver is an exercise that relieves symptoms of vertigo. Vertigo is the feeling that you or your surroundings are moving when they are not. When you feel vertigo, you may feel like the room is spinning and have trouble walking. Dizziness is a little different than vertigo. When you are dizzy, you may feel unsteady or light-headed. You can do this maneuver at home whenever you have symptoms of vertigo. You can do it up to 3 times a day until your symptoms go away. Even though the Epley maneuver may relieve your vertigo for a few weeks, it is possible that your symptoms will return. This maneuver relieves vertigo, but it does not relieve dizziness. What are the risks? If it is done correctly, the Epley maneuver is considered safe. Sometimes it can lead to dizziness or nausea that goes away after a short time. If you develop other symptoms, such as changes in vision, weakness, or numbness, stop doing the maneuver and call your health care provider. How to perform the Epley maneuver 1. Sit on the edge of a bed or table with your back straight and your legs extended or hanging over the edge of the bed or table. 2. Turn your head halfway toward the affected ear or side. 3. Lie backward quickly with your head turned until you are lying flat on your back. You may want to position a pillow under your shoulders. 4. Hold this position for 30 seconds. You may experience an attack of vertigo. This is normal. 5. Turn your head to the opposite direction until your unaffected ear is facing the floor. 6. Hold this position for 30 seconds. You may experience an attack of vertigo. This is normal. Hold this position until the vertigo stops. 7. Turn your whole body to the same side as your head. Hold for another 30 seconds. 8. Sit back up. You can repeat this exercise up to 3 times a day. Follow these  instructions at home:  After doing the Epley maneuver, you can return to your normal activities.  Ask your health care provider if there is anything you should do at home to prevent vertigo. He or she may recommend that you: ? Keep your head raised (elevated) with two or more pillows while you sleep. ? Do not sleep on the side of your affected ear. ? Get up slowly from bed. ? Avoid sudden movements during the day. ? Avoid extreme head movement, like looking up or bending over. Contact a health care provider if:  Your vertigo gets worse.  You have other symptoms, including: ? Nausea. ? Vomiting. ? Headache. Get help right away if:  You have vision changes.  You have a severe or worsening headache or neck pain.  You cannot stop vomiting.  You have new numbness or weakness in any part of your body. Summary  Vertigo is the feeling that you or your surroundings are moving when they are  not.  The Epley maneuver is an exercise that relieves symptoms of vertigo.  If the Epley maneuver is done correctly, it is considered safe. You can do it up to 3 times a day. This information is not intended to replace advice given to you by your health care provider. Make sure you discuss any questions you have with your health care provider. Document Released: 03/22/2013 Document Revised: 02/27/2017 Document Reviewed: 02/05/2016 Elsevier Patient Education  [place].   Adjustment Disorder, Adult Adjustment disorder is a group of symptoms that can develop after a stressful life event, such as the loss of a job or serious physical illness. The symptoms can affect how you feel, think, and act. They may interfere with your relationships. Adjustment disorder increases your risk of suicide and substance abuse. If this disorder is not managed early, it can develop into a more serious condition, such as major depressive disorder or post-traumatic stress disorder. What are the causes? This  condition happens when you have trouble recovering from or coping with a stressful life event. What increases the risk? You are more likely to develop this condition if:  You have had depression or anxiety.  You are being treated for a long-term (chronic) illness.  You are being treated for an illness that cannot be cured (terminal illness).  You have a family history of mental illness. What are the signs or symptoms? Symptoms of this condition include:  Extreme trouble doing daily tasks, such as going to work.  Sadness, depression, or crying spells.  Worrying a lot.  Loss of enjoyment.  Change in appetite or weight.  Feelings of loss or hopelessness.  Thoughts of suicide.  Anxiety, worry, or nervousness.  Trouble sleeping.  Avoiding family and friends.  Fighting or vandalism.  Complaining of feeling sick without being ill.  Feeling dazed or disconnected.  Nightmares.  Trouble sleeping.  Irritability.  Reckless driving.  Poor work Systems analyst.  Ignoring bills. Symptoms of this condition start within three months of the stressful event. They do not last more than six months, unless the stressful circumstances last longer. Normal grieving after the death of a loved one is not a symptom of this condition. How is this diagnosed? To diagnose this condition, your health care provider will ask about what has happened in your life and how it has affected you. He or she may also ask about your medical history and your use of medicines, alcohol, and other substances. Your health care provider may do a physical exam and order lab tests or other studies. You may be referred to a mental health specialist. How is this treated? Treatment options for this condition include:  Counseling or talk therapy. Talk therapy is usually provided by mental health specialists.  Medicines. Certain medicines may help with depression, anxiety, and sleep.  Support groups. These offer  emotional support, advice, and guidance. They are made up of people who have had similar experiences.  Observation and time. This is sometimes called "watchful waiting." In this treatment, health care providers monitor your health and behavior without other treatment. Adjustment disorder sometimes gets better on its own with time. Follow these instructions at home:  Take over-the-counter and prescription medicines only as told by your health care provider.  Keep all follow-up visits as told by your health care provider. This is important. Contact a health care provider if:  Your symptoms do not improve in six months.  Your symptoms get worse. Get help right away if:  You have  serious thoughts about hurting yourself or someone else. If you ever feel like you may hurt yourself or others, or have thoughts about taking your own life, get help right away. You can go to your nearest emergency department or call:  Your local emergency services (911 in the [place]  A suicide crisis helpline, such as the [facility] at (970)114-1328. This is open 24 hours a day. Summary  Adjustment disorder is a group of symptoms that can develop after a stressful life event, such as the loss of a job or serious physical illness. The symptoms can affect how you feel, think, and act. They may interfere with your relationships.  Symptoms of this condition start within three months of the stressful event. They do not last more than six months, unless the stressful circumstances last longer.  Treatment may include talk therapy, medicines, participation in a support group, or observation to see if symptoms improve.  Contact your health care provider if your symptoms get worse or do not improve in six months.  If you ever feel like you may hurt yourself or others, or have thoughts about taking your own life, get help right away. This information is not intended to replace advice given to you  by your health care provider. Make sure you discuss any questions you have with your health care provider. Document Released: 11/19/2005 Document Revised: 02/27/2017 Document Reviewed: 05/16/2016 Elsevier Patient Education  [employer].   If you have lab work done today you will be contacted with your lab results within the next 2 weeks.  If you have not heard from [place] then please contact us. The fastest way to get your results is to register for My Chart.   IF you received an x-ray today, you will receive an invoice from [facility]. Please contact [facility] at [phone] with questions or concerns regarding your invoice.   IF you received labwork today, you will receive an invoice from [facility]. Please contact [facility] at [phone] with questions or concerns regarding your invoice.   Our billing staff will not be able to assist you with questions regarding bills from these companies.  You will be contacted with the lab results as soon as they are available. The fastest way to get your results is to activate your My Chart account. Instructions are located on the last page of this paperwork. If you have not heard from [place] regarding the results in 2 weeks, please contact this office.

## 2018-12-22 NOTE — Addendum Note (Signed)
Addended byLaurann Montana on: 12/22/2018 12:54 PM   Modules accepted: Orders

## 2018-12-22 NOTE — Telephone Encounter (Signed)
Order put in for this.

## 2018-12-23 LAB — BASIC METABOLIC PANEL
BUN/Creatinine Ratio: 21 (ref 12–28)
BUN: 14 mg/dL (ref 8–27)
CO2: 23 mmol/L (ref 20–29)
Calcium: 10 mg/dL (ref 8.7–10.3)
Chloride: 102 mmol/L (ref 96–106)
Creatinine, Ser: 0.66 mg/dL (ref 0.57–1.00)
GFR calc Af Amer: 110 mL/min/{1.73_m2} (ref >59)
GFR calc non Af Amer: 96 mL/min/{1.73_m2} (ref >59)
Glucose: 111 mg/dL — ABNORMAL HIGH (ref 65–99)
Potassium: 4.2 mmol/L (ref 3.5–5.2)
Sodium: 140 mmol/L (ref 134–144)

## 2018-12-23 LAB — TSH: TSH: 1.79 u[IU]/mL (ref 0.450–4.500)

## 2018-12-24 DIAGNOSIS — M7502 Adhesive capsulitis of left shoulder: Secondary | ICD-10-CM | POA: Diagnosis not present

## 2018-12-24 NOTE — Progress Notes (Signed)
Office Visit Note   Patient: Megan Hooper           Date of Birth: 23-Sep-1955           MRN: [id number] Visit Date: 12/20/2018 Requested by: [name], MD [address] [place],  [place] [address] PCP: [name], MD  Subjective: Chief Complaint  Patient presents with  . Left Shoulder - Pain    HPI: Megan Hooper is a 63 y.o. female who presents to the office complaining of left shoulder pain.  Patient returns after being diagnosed with frozen shoulder at the last office visit.  She still notes moderate to severe pain that is worse than at her last visit.  Pain wakes her up at night and she treats it with occasional Advil with little relief.  She has been attempting a home exercise program with little success.  She states that she is only able to do the exercises about 1 time a day at night after coming home from work.  She works as a Hydrographic surveyor.              ROS:  All systems reviewed are negative as they relate to the chief complaint within the history of present illness.  Patient denies fevers or chills.  Assessment & Plan: Visit Diagnoses: No diagnosis found.  Plan: Patient is a 63 year old female who presents for reevaluation of left frozen shoulder.  She has not progressed very much in range of motion since last office visit.  Her motion is quite restricted and she is not been very compliant with home exercise program.  Glenohumeral joint injection of corticosteroid given in the office today.  Patient tolerated the procedure well.  She will begin outpatient therapy.  She will follow-up in 6 weeks for reevaluation.  Patient agreed with plan.  Follow-Up Instructions: No follow-ups on file.   Orders:  No orders of the defined types were placed in this encounter.  No orders of the defined types were placed in this encounter.     Procedures: Large Joint Inj: L glenohumeral on 12/24/2018 5:15 PM Indications: diagnostic evaluation and pain Details: 18  G 1.5 in needle, posterior approach  Arthrogram: No  Medications: 9 mL bupivacaine 0.5 %; 40 mg methylPREDNISolone acetate 40 MG/ML; 5 mL lidocaine 1 % Outcome: tolerated well, no immediate complications Procedure, treatment alternatives, risks and benefits explained, specific risks discussed. Consent was given by the patient. Immediately prior to procedure a time out was called to verify the correct patient, procedure, equipment, support staff and site/side marked as required. Patient was prepped and draped in the usual sterile fashion.       Clinical Data: No additional findings.  Objective: Vital Signs: There were no vitals taken for this visit.  Physical Exam:  Constitutional: Patient appears well-developed HEENT:  Head: Normocephalic Eyes:EOM are normal Neck: Normal range of motion Cardiovascular: Normal rate Pulmonary/chest: Effort normal Neurologic: Patient is alert Skin: Skin is warm Psychiatric: Patient has normal mood and affect  Ortho Exam:  Left shoulder Exam Forward flexion of 80 degrees.  Abduction of 75 degrees.  External rotation of 5 degrees.  Markedly reduced internal rotation of left shoulder behind her back when compared with the contralateral side. No TTP over the [facility] joint.  Moderate TTP over the bicipital groove 5/5 motor strength of the subscapularis, supraspinatus, and infraspinatus muscles Positive Hawkins impingement 5/5 grip strength, forearm pronation/supination, and bicep strength  Specialty Comments:  No specialty comments available.  Imaging:  No results found.   PMFS History: Patient Active Problem List   Diagnosis Date Noted  . Nausea 07/27/2018  . Clavicle fracture, shaft 07/27/2018  . Osteopenia 12/20/2015  . Allergy to cats 12/19/2015  . Asthma, mild intermittent 12/19/2015  . Hyperlipidemia 09/19/2014  . Hot flashes 09/19/2014   Past Medical History:  Diagnosis Date  . Allergy   . Anemia   . Asthma   . Clavicle fracture     Left  . Dyslipidemia   . Leukopenia    mild  . Osteopenia   . Overactive bladder     Family History  Problem Relation Age of Onset  . Hyperlipidemia Mother   . Cancer Father     Past Surgical History:  Procedure Laterality Date  . ABDOMINAL HYSTERECTOMY    . ORIF CLAVICULAR FRACTURE Left 07/27/2018   OPEN REDUCTION INTERNAL FIXATION (ORIF) LEFT CLAVICULAR FRACTURE AND ILIAC CREST BONE MARROW ASPIRATION FOR GRAFTING  . ORIF CLAVICULAR FRACTURE Left 07/27/2018   Procedure: OPEN REDUCTION INTERNAL FIXATION (ORIF) LEFT CLAVICULAR FRACTURE AND POSSIBLE BONE MARROW ASPIRATION FOR GRAFTING;  Surgeon: Meredith Pel, MD;  Location: [facility];  Service: Orthopedics;  Laterality: Left;   Social History   Occupational History  . Occupation: Pharmacist, hospital  Tobacco Use  . Smoking status: Former Smoker    Packs/day: 0.50    Years: 13.00    Pack years: 6.50    Types: Cigarettes    Start date: 05/26/1978    Quit date: 10/22/1991    Years since quitting: 27.1  . Smokeless tobacco: Never Used  . Tobacco comment: quit  13 years ago  Substance and Sexual Activity  . Alcohol use: No    Alcohol/week: 0.0 standard drinks  . Drug use: No  . Sexual activity: Not Currently

## 2018-12-26 ENCOUNTER — Encounter: Payer: Self-pay | Admitting: Orthopedic Surgery

## 2018-12-26 MED ORDER — BUPIVACAINE HCL 0.5 % IJ SOLN
9.0000 mL | INTRAMUSCULAR | Status: AC | PRN
  Administered 2018-12-24: 9 mL via INTRA_ARTICULAR

## 2018-12-26 MED ORDER — METHYLPREDNISOLONE ACETATE 40 MG/ML IJ SUSP
40.0000 mg | INTRAMUSCULAR | Status: AC | PRN
  Administered 2018-12-24: 40 mg via INTRA_ARTICULAR

## 2018-12-26 MED ORDER — LIDOCAINE HCL 1 % IJ SOLN
5.0000 mL | INTRAMUSCULAR | Status: AC | PRN
  Administered 2018-12-24: 5 mL

## 2018-12-29 ENCOUNTER — Encounter: Payer: Self-pay | Admitting: Physical Therapy

## 2018-12-29 ENCOUNTER — Ambulatory Visit: Payer: BC Managed Care – PPO | Attending: Orthopedic Surgery | Admitting: Physical Therapy

## 2018-12-29 ENCOUNTER — Other Ambulatory Visit: Payer: Self-pay

## 2018-12-29 DIAGNOSIS — G8929 Other chronic pain: Secondary | ICD-10-CM | POA: Insufficient documentation

## 2018-12-29 DIAGNOSIS — M25512 Pain in left shoulder: Secondary | ICD-10-CM | POA: Insufficient documentation

## 2018-12-29 DIAGNOSIS — M25612 Stiffness of left shoulder, not elsewhere classified: Secondary | ICD-10-CM | POA: Insufficient documentation

## 2018-12-29 NOTE — Patient Instructions (Signed)
Access Code: [email or web address]  URL: [email or web address]  Date: 12/29/2018  Prepared by: Lyndee Hensen   Exercises Supine Shoulder Flexion with Dowel - 10 reps - 1-2 sets - 5 hold - 3x daily Supine Shoulder External Rotation with Dowel - 10 reps - 1-2 sets                   - 5 hold - 3x daily Standing Shoulder Flexion Wall Walk - 10 reps - 1 sets - 3x daily Supine Chest Stretch with Elbows Bent - 5 reps - 20 hold - 2-3x daily Seated Shoulder Flexion AAROM with Pulley Behind - 10 reps - 2 sets - 3x daily

## 2018-12-29 NOTE — Therapy (Signed)
[place], [place], [address] Phone: [phone]   Fax:  [phone]  Physical Therapy Evaluation  Patient Details  Name: ROMUNDA SCHREIB MRN: [id number] Date of Birth: [birth year] Referring Provider (PT): Marcene Duos   Encounter Date: 12/29/2018  PT End of Session - 12/29/18 0928    Visit Number  1    Number of Visits  12    Date for PT Re-Evaluation  02/09/19    Authorization Type  BCBS    PT Start Time  0922    PT Stop Time  1000    PT Time Calculation (min)  38 min    Activity Tolerance  Patient tolerated treatment well    Behavior During Therapy  [facility] for tasks assessed/performed       Past Medical History:  Diagnosis Date  . Allergy   . Anemia   . Asthma   . Clavicle fracture    Left  . Dyslipidemia   . Leukopenia    mild  . Osteopenia   . Overactive bladder     Past Surgical History:  Procedure Laterality Date  . ABDOMINAL HYSTERECTOMY    . ORIF CLAVICULAR FRACTURE Left 07/27/2018   OPEN REDUCTION INTERNAL FIXATION (ORIF) LEFT CLAVICULAR FRACTURE AND ILIAC CREST BONE MARROW ASPIRATION FOR GRAFTING  . ORIF CLAVICULAR FRACTURE Left 07/27/2018   Procedure: OPEN REDUCTION INTERNAL FIXATION (ORIF) LEFT CLAVICULAR FRACTURE AND POSSIBLE BONE MARROW ASPIRATION FOR GRAFTING;  Surgeon: Meredith Pel, MD;  Location: [place];  Service: Orthopedics;  Laterality: Left;    There were no vitals filed for this visit.   Subjective Assessment - 12/29/18 0925    Subjective  Pt with L clavicle ORIF 07/27/18. Recent x-ray shows good healing, but now pt states stiffness/frozen shoulder. Had recent injection , still has stiffness and soreness, is sleeping better. R handed. In between jobs, Psychologist, forensic, looking for new nanny job. Pt states she may have to have surery if it does not improve, wants to avoid this.    Limitations  Lifting;Writing;House hold activities    Patient Stated Goals   Decreased pain, improved movment.    Currently in Pain?  Yes    Pain Score  4     Pain Location  Shoulder    Pain Orientation  Left    Pain Type  Chronic pain;Acute pain    Pain Onset  More than a month ago    Pain Frequency  Intermittent    Aggravating Factors   reaching, lifing         [facility] PT Assessment - 12/29/18 0001      Assessment   Medical Diagnosis  Adhesive capsulitis L shoulder    Referring Provider (PT)  Marcene Duos    Hand Dominance  Right    Prior Therapy  No      Balance Screen   Has the patient fallen in the past 6 months  No      Prior Function   Level of Independence  Independent      Cognition   Overall Cognitive Status  Within Functional Limits for tasks assessed      ROM / Strength   AROM / PROM / Strength  AROM;PROM;Strength      AROM   AROM Assessment Site  Shoulder    Right/Left Shoulder  Left    Left Shoulder Flexion  108 Degrees    Left Shoulder ABduction  65 Degrees    Left Shoulder  Internal Rotation  60 Degrees    Left Shoulder External Rotation  40 Degrees      PROM   PROM Assessment Site  Shoulder    Right/Left Shoulder  Left    Left Shoulder Flexion  115 Degrees    Left Shoulder ABduction  115 Degrees    Left Shoulder Internal Rotation  65 Degrees    Left Shoulder External Rotation  40 Degrees      Strength   Overall Strength Comments  IR/ER taken at mid range     Strength Assessment Site  Shoulder    Right/Left Shoulder  Left    Left Shoulder Flexion  3-/5    Left Shoulder ABduction  3-/5    Left Shoulder Internal Rotation  4/5    Left Shoulder External Rotation  4-/5      Palpation   Palpation comment  hypomobile GHJ                Objective measurements completed on examination: See above findings.      [facility] Adult PT Treatment/Exercise - 12/29/18 0001      Exercises   Exercises  Shoulder      Shoulder Exercises: Supine   External Rotation  AAROM;15 reps    External Rotation Limitations  cane     Flexion  AAROM;15 reps    Flexion Limitations  cane      Shoulder Exercises: Pulleys   Flexion  2 minutes      Shoulder Exercises: Stretch   Other Shoulder Stretches  ER butterfly/supine 10 sec x10    Other Shoulder Stretches  Wall walks x10;       Manual Therapy   Manual Therapy  Passive ROM;Joint mobilization    Passive ROM  L GHJ, all motions              PT Education - 12/29/18 [id number]    Education Details  PT POC, HEP    Person(s) Educated  Patient    Methods  Explanation;Demonstration;Tactile cues;Verbal cues;Handout    Comprehension  Verbalized understanding;Returned demonstration;Verbal cues required;Need further instruction       PT Short Term Goals - 12/29/18 1005      PT SHORT TERM GOAL #1   Title  Pt to be independent with initial HEP    Time  2    Period  Weeks    Target Date  01/12/19      PT SHORT TERM GOAL #2   Title  Pt to demo improved PROM for shoulder flex by at least 10 degrees    Time  2    Period  Weeks    Status  New    Target Date  01/12/19        PT Long Term Goals - 12/29/18 1006      PT LONG TERM GOAL #1   Title  Pt to be independent with final HEP    Time  6    Period  Weeks    Status  New    Target Date  02/09/19      PT LONG TERM GOAL #2   Title  Pt to demo improved PROM and AROM to be WNL for L shoulder, to improve ability for ADLs and IADLs.    Time  6    Period  Weeks    Status  New    Target Date  02/09/19      PT LONG TERM GOAL #3   Title  Pt to  demo improved strength of L Shoulder to at least 4/5 for elevation, and 4+/5 for rotation,  to improve ability for IADLs.    Time  6    Period  Weeks    Status  New    Target Date  02/09/19      PT LONG TERM GOAL #4   Title  Pt to demo decreased pain in L shoulder to 0-2/10 with activity    Time  6    Period  Weeks    Status  New    Target Date  02/09/19             Plan - 12/29/18 1001    Clinical Impression Statement  Pt presents with primary complaint of  increased pain and stiffness in L shoulder, following previous L clavicle ORIF on 4/28. She has stiffness in GHJ and decreased ROM for all motions. She has weakness in L shoulder and UE, and decreased ability for reaching, lifting, carrying, and IADLs. Pt to benefit from skilled PT to improve deficits and return to PLOF.    Personal Factors and Comorbidities  Past/Current Experience;Time since onset of injury/illness/exacerbation    Examination-Activity Limitations  Reach Overhead;Carry;Lift;Sleep    Examination-Participation Restrictions  Cleaning;Meal Prep;Yard Work;Community Activity;Driving;Laundry;Shop    Stability/Clinical Decision Making  Stable/Uncomplicated    Clinical Decision Making  Low    Rehab Potential  Good    PT Frequency  2x / week    PT Duration  6 weeks    PT Treatment/Interventions  ADLs/Self Care Home Management;Cryotherapy;Electrical Stimulation;DME Instruction;Ultrasound;Moist Heat;Iontophoresis 4mg /ml Dexamethasone;Functional mobility training;Therapeutic activities;Therapeutic exercise;Patient/family education;Neuromuscular re-education;Manual techniques;Scar mobilization;Taping;Dry needling;Passive range of motion;Joint Manipulations;Vasopneumatic Device    PT Home Exercise Plan  [id number]    Consulted and Agree with Plan of Care  Patient       Patient will benefit from skilled therapeutic intervention in order to improve the following deficits and impairments:  Pain, Increased muscle spasms, Decreased scar mobility, Decreased mobility, Decreased activity tolerance, Decreased endurance, Decreased range of motion, Decreased strength, Impaired UE functional use, Impaired flexibility  Visit Diagnosis: Chronic left shoulder pain  Stiffness of left shoulder, not elsewhere classified     Problem List Patient Active Problem List   Diagnosis Date Noted  . Nausea 07/27/2018  . Clavicle fracture, shaft 07/27/2018  . Osteopenia 12/20/2015  . Allergy to cats 12/19/2015   . Asthma, mild intermittent 12/19/2015  . Hyperlipidemia 09/19/2014  . Hot flashes 09/19/2014    Lyndee Hensen, PT, DPT 10:19 AM  12/29/18    [facility] Out[facility] [address] [place], [place], [address] Phone: [phone]   Fax:  [phone]  Name: KYNIAH LUSH MRN: [id number] Date of Birth: 1955-12-13

## 2018-12-31 DIAGNOSIS — H2512 Age-related nuclear cataract, left eye: Secondary | ICD-10-CM | POA: Diagnosis not present

## 2018-12-31 DIAGNOSIS — H2511 Age-related nuclear cataract, right eye: Secondary | ICD-10-CM | POA: Diagnosis not present

## 2019-01-04 ENCOUNTER — Ambulatory Visit: Payer: BC Managed Care – PPO | Attending: Orthopedic Surgery | Admitting: Physical Therapy

## 2019-01-04 ENCOUNTER — Other Ambulatory Visit: Payer: Self-pay

## 2019-01-04 ENCOUNTER — Encounter: Payer: Self-pay | Admitting: Physical Therapy

## 2019-01-04 DIAGNOSIS — M25512 Pain in left shoulder: Secondary | ICD-10-CM | POA: Insufficient documentation

## 2019-01-04 DIAGNOSIS — M25612 Stiffness of left shoulder, not elsewhere classified: Secondary | ICD-10-CM

## 2019-01-04 DIAGNOSIS — G8929 Other chronic pain: Secondary | ICD-10-CM | POA: Insufficient documentation

## 2019-01-04 DIAGNOSIS — Z1231 Encounter for screening mammogram for malignant neoplasm of breast: Secondary | ICD-10-CM | POA: Diagnosis not present

## 2019-01-04 LAB — HM MAMMOGRAPHY

## 2019-01-04 NOTE — Therapy (Signed)
[place], [place], [address] Phone: [phone]   Fax:  [phone]  Physical Therapy Treatment  Patient Details  Name: RAHA SIRBAUGH MRN: [id number] Date of Birth: 04/05/1955 Referring Provider (PT): Marcene Duos   Encounter Date: 01/04/2019  PT End of Session - 01/04/19 0933    Visit Number  2    Number of Visits  12    Date for PT Re-Evaluation  02/09/19    Authorization Type  BCBS    PT Start Time  0933    PT Stop Time  1013    PT Time Calculation (min)  40 min    Activity Tolerance  Patient tolerated treatment well    Behavior During Therapy  [facility] for tasks assessed/performed       Past Medical History:  Diagnosis Date  . Allergy   . Anemia   . Asthma   . Clavicle fracture    Left  . Dyslipidemia   . Leukopenia    mild  . Osteopenia   . Overactive bladder     Past Surgical History:  Procedure Laterality Date  . ABDOMINAL HYSTERECTOMY    . ORIF CLAVICULAR FRACTURE Left 07/27/2018   OPEN REDUCTION INTERNAL FIXATION (ORIF) LEFT CLAVICULAR FRACTURE AND ILIAC CREST BONE MARROW ASPIRATION FOR GRAFTING  . ORIF CLAVICULAR FRACTURE Left 07/27/2018   Procedure: OPEN REDUCTION INTERNAL FIXATION (ORIF) LEFT CLAVICULAR FRACTURE AND POSSIBLE BONE MARROW ASPIRATION FOR GRAFTING;  Surgeon: Meredith Pel, MD;  Location: [place];  Service: Orthopedics;  Laterality: Left;    There were no vitals filed for this visit.  Subjective Assessment - 01/04/19 0937    Subjective  I am pretty comfortable now.  I am in between jobs. I did my exericises a couple times every day.  I did get the overhead pulley for over the door at home and I am using that as well.  I am a 2/10 right now.  I had vertigo and I am taking medicince that helps. I will return to the MD on FRiday. I had cataract surgery last Friday Oct 1,    Limitations  Lifting;Writing;House hold activities    Patient Stated Goals  Decreased pain,  improved movment.    Currently in Pain?  Yes    Pain Score  2     Pain Location  Shoulder    Pain Orientation  Left    Pain Descriptors / Indicators  Tightness    Pain Type  Chronic pain;Acute pain    Pain Onset  More than a month ago         [facility] PT Assessment - 01/04/19 0001      AROM   Left Shoulder Flexion  114 Degrees   after manual                  [facility] Adult PT Treatment/Exercise - 01/04/19 0001      Exercises   Exercises  Shoulder   Wall clock with head turned right and left shld with wash cloth gently stretching in abduction as tolerated     Shoulder Exercises: Supine   External Rotation  AAROM;15 reps   5-10 sec hold   External Rotation Limitations  cane    Flexion  AAROM;15 reps   5 - 10 sec hold   Flexion Limitations  cane with VC for breathing      Shoulder Exercises: Stretch   Other Shoulder Stretches  ER butterfly/supine 10 sec x10  Other Shoulder Stretches  Wall walks x10;       Manual Therapy   Manual Therapy  Passive ROM;Joint mobilization    Joint Mobilization  Grade 1/2 GHJ AP and inf glide    Soft tissue mobilization  STW to left subclavious, upper trap/ levator and subscapularis    Passive ROM  L GHJ, all motions              PT Education - 01/04/19 1012    Education Details  reviewed HEP and added Wall clock with left shoulder touching wall and abducting UE to pain free Range with head turned to right    Person(s) Educated  Patient    Methods  Explanation;Demonstration;Tactile cues;Verbal cues    Comprehension  Verbalized understanding;Returned demonstration       PT Short Term Goals - 01/04/19 0934      PT SHORT TERM GOAL #1   Title  Pt to be independent with initial HEP    Time  2    Period  Weeks    Status  On-going    Target Date  01/12/19      PT SHORT TERM GOAL #2   Title  Pt to demo improved PROM for shoulder flex by at least 10 degrees    Baseline  AROM left shoulder flexion 114 today    Time  2     Period  Weeks    Status  Achieved    Target Date  01/12/19        PT Long Term Goals - 12/29/18 1006      PT LONG TERM GOAL #1   Title  Pt to be independent with final HEP    Time  6    Period  Weeks    Status  New    Target Date  02/09/19      PT LONG TERM GOAL #2   Title  Pt to demo improved PROM and AROM to be WNL for L shoulder, to improve ability for ADLs and IADLs.    Time  6    Period  Weeks    Status  New    Target Date  02/09/19      PT LONG TERM GOAL #3   Title  Pt to demo improved strength of L Shoulder to at least 4/5 for elevation, and 4+/5 for rotation,  to improve ability for IADLs.    Time  6    Period  Weeks    Status  New    Target Date  02/09/19      PT LONG TERM GOAL #4   Title  Pt to demo decreased pain in L shoulder to 0-2/10 with activity    Time  6    Period  Weeks    Status  New    Target Date  02/09/19            Plan - 01/04/19 1017    Clinical Impression Statement  Ms. Breitling has purchased a pulley for home use and enters clinic with 2/10 pain today.  Pt reviewed HEP and added wall clock for left shld abduction with washcloth.  Pt benefitted from manual and was able to actively flex left shoulder to 114 today.  Pt STG # 2 achieved today   Personal Factors and Comorbidities  Past/Current Experience;Time since onset of injury/illness/exacerbation    Examination-Activity Limitations  Reach Overhead;Carry;Lift;Sleep    Examination-Participation Restrictions  Cleaning;Meal Prep;Yard Work;Community Activity;Driving;Laundry;Shop    Stability/Clinical Decision Making  Stable/Uncomplicated    Clinical Decision Making  Low    Rehab Potential  Good    PT Frequency  2x / week    PT Duration  6 weeks    PT Treatment/Interventions  ADLs/Self Care Home Management;Cryotherapy;Electrical Stimulation;DME Instruction;Ultrasound;Moist Heat;Iontophoresis 4mg /ml Dexamethasone;Functional mobility training;Therapeutic activities;Therapeutic  exercise;Patient/family education;Neuromuscular re-education;Manual techniques;Scar mobilization;Taping;Dry needling;Passive range of motion;Joint Manipulations;Vasopneumatic Device    PT Next Visit Plan  Assess goals    PT Home Exercise Plan  [id number]    Consulted and Agree with Plan of Care  Patient       Patient will benefit from skilled therapeutic intervention in order to improve the following deficits and impairments:  Pain, Increased muscle spasms, Decreased scar mobility, Decreased mobility, Decreased activity tolerance, Decreased endurance, Decreased range of motion, Decreased strength, Impaired UE functional use, Impaired flexibility  Visit Diagnosis: Chronic left shoulder pain  Stiffness of left shoulder, not elsewhere classified     Problem List Patient Active Problem List   Diagnosis Date Noted  . Nausea 07/27/2018  . Clavicle fracture, shaft 07/27/2018  . Osteopenia 12/20/2015  . Allergy to cats 12/19/2015  . Asthma, mild intermittent 12/19/2015  . Hyperlipidemia 09/19/2014  . Hot flashes 09/19/2014    Voncille Lo, PT Certified Exercise Expert for the Aging Adult  01/04/19 11:19 AM Phone: [phone] Fax: [facility] [address] [place], [place], [address] Phone: [phone]   Fax:  [phone]  Name: LAHOMA CONNICK MRN: [id number] Date of Birth: 1956/03/03

## 2019-01-05 ENCOUNTER — Encounter: Payer: Self-pay | Admitting: Physical Therapy

## 2019-01-05 ENCOUNTER — Ambulatory Visit: Payer: BC Managed Care – PPO | Admitting: Physical Therapy

## 2019-01-05 DIAGNOSIS — G8929 Other chronic pain: Secondary | ICD-10-CM

## 2019-01-05 DIAGNOSIS — M25612 Stiffness of left shoulder, not elsewhere classified: Secondary | ICD-10-CM

## 2019-01-05 DIAGNOSIS — M25512 Pain in left shoulder: Secondary | ICD-10-CM

## 2019-01-05 NOTE — Therapy (Signed)
[place], [place], [address] Phone: [phone]   Fax:  [phone]  Physical Therapy Treatment  Patient Details  Name: Megan Hooper MRN: [id number] Date of Birth: 1955/06/20 Referring Provider (Megan Hooper): Marcene Duos   Encounter Date: 01/05/2019  Megan Hooper End of Session - 01/05/19 0929    Visit Number  3    Number of Visits  12    Date for Megan Hooper Re-Evaluation  02/09/19    Authorization Type  BCBS    Megan Hooper Start Time  0915    Megan Hooper Stop Time  0957    Megan Hooper Time Calculation (min)  42 min    Activity Tolerance  Patient tolerated treatment well    Behavior During Therapy  [facility] for tasks assessed/performed       Past Medical History:  Diagnosis Date  . Allergy   . Anemia   . Asthma   . Clavicle fracture    Left  . Dyslipidemia   . Leukopenia    mild  . Osteopenia   . Overactive bladder     Past Surgical History:  Procedure Laterality Date  . ABDOMINAL HYSTERECTOMY    . ORIF CLAVICULAR FRACTURE Left 07/27/2018   OPEN REDUCTION INTERNAL FIXATION (ORIF) LEFT CLAVICULAR FRACTURE AND ILIAC CREST BONE MARROW ASPIRATION FOR GRAFTING  . ORIF CLAVICULAR FRACTURE Left 07/27/2018   Procedure: OPEN REDUCTION INTERNAL FIXATION (ORIF) LEFT CLAVICULAR FRACTURE AND POSSIBLE BONE MARROW ASPIRATION FOR GRAFTING;  Surgeon: Meredith Pel, MD;  Location: [place];  Service: Orthopedics;  Laterality: Left;    There were no vitals filed for this visit.  Subjective Assessment - 01/05/19 0929    Subjective  Megan Hooper reports doing HEP. Feels shoulder is less painful, but still stiff.    Patient Stated Goals  Decreased pain, improved movment.    Currently in Pain?  Yes    Pain Score  2     Pain Location  Shoulder    Pain Orientation  Left    Pain Descriptors / Indicators  Aching;Tightness    Pain Type  Acute pain;Chronic pain                       [facility] Adult Megan Hooper Treatment/Exercise - 01/05/19 0930      Exercises   Exercises  Shoulder      Shoulder Exercises: Supine   Horizontal ABduction  15 reps;AROM    External Rotation  AAROM;20 reps   5-10 sec hold   External Rotation Limitations  cane    Flexion  AAROM;20 reps   5 - 10 sec hold   Flexion Limitations  cane      Shoulder Exercises: Standing   Row  20 reps    Theraband Level (Shoulder Row)  Level 2 (Red)      Shoulder Exercises: Pulleys   Flexion  2 minutes    Scaption  2 minutes      Shoulder Exercises: Stretch   Other Shoulder Stretches  ER butterfly/supine 10 sec x10    Other Shoulder Stretches  Wall walks x10;       Manual Therapy   Manual Therapy  Passive ROM;Joint mobilization    Joint Mobilization  Grade 2/3 GHJ AP and inf glide, scap mobs    Soft tissue mobilization  sub scap release    Passive ROM  L GHJ, all motions                Megan Hooper Short Term  Goals - 01/04/19 0934      Megan Hooper SHORT TERM GOAL #1   Title  Megan Hooper to be independent with initial HEP    Time  2    Period  Weeks    Status  On-going    Target Date  01/12/19      Megan Hooper SHORT TERM GOAL #2   Title  Megan Hooper to demo improved PROM for shoulder flex by at least 10 degrees    Baseline  AROM left shoulder flexion 115 today    Time  2    Period  Weeks    Status  Achieved    Target Date  01/12/19        Megan Hooper Long Term Goals - 12/29/18 1006      Megan Hooper LONG TERM GOAL #1   Title  Megan Hooper to be independent with final HEP    Time  6    Period  Weeks    Status  New    Target Date  02/09/19      Megan Hooper LONG TERM GOAL #2   Title  Megan Hooper to demo improved PROM and AROM to be WNL for L shoulder, to improve ability for ADLs and IADLs.    Time  6    Period  Weeks    Status  New    Target Date  02/09/19      Megan Hooper LONG TERM GOAL #3   Title  Megan Hooper to demo improved strength of L Shoulder to at least 4/5 for elevation, and 4+/5 for rotation,  to improve ability for IADLs.    Time  6    Period  Weeks    Status  New    Target Date  02/09/19      Megan Hooper LONG TERM GOAL #4   Title  Megan Hooper to demo  decreased pain in L shoulder to 0-2/10 with activity    Time  6    Period  Weeks    Status  New    Target Date  02/09/19            Plan - 01/05/19 1420    Clinical Impression Statement  Megan Hooper wtih GHJ stiffness that is limiting ROM. Manual and ther ex focus on joint mobiiity and ROM today. Plan to progress as tolerated. Megan Hooper also report ongoing vertigo that has been present for about 2 weeks. Discussed seeing Megan Hooper for this as well. Megan Hooper will discuss with MD at appt tomorrow.    Personal Factors and Comorbidities  Past/Current Experience;Time since onset of injury/illness/exacerbation    Examination-Activity Limitations  Reach Overhead;Carry;Lift;Sleep    Examination-Participation Restrictions  Cleaning;Meal Prep;Yard Work;Community Activity;Driving;Laundry;Shop    Stability/Clinical Decision Making  Stable/Uncomplicated    Rehab Potential  Good    Megan Hooper Frequency  2x / week    Megan Hooper Duration  6 weeks    Megan Hooper Treatment/Interventions  ADLs/Self Care Home Management;Cryotherapy;Electrical Stimulation;DME Instruction;Ultrasound;Moist Heat;Iontophoresis 4mg /ml Dexamethasone;Functional mobility training;Therapeutic activities;Therapeutic exercise;Patient/family education;Neuromuscular re-education;Manual techniques;Scar mobilization;Taping;Dry needling;Passive range of motion;Joint Manipulations;Vasopneumatic Device    Megan Hooper Next Visit Plan  Assess goals    Megan Hooper Home Exercise Plan  [id number]    Consulted and Agree with Plan of Care  Patient       Patient will benefit from skilled therapeutic intervention in order to improve the following deficits and impairments:  Pain, Increased muscle spasms, Decreased scar mobility, Decreased mobility, Decreased activity tolerance, Decreased endurance, Decreased range of motion, Decreased strength, Impaired UE functional use, Impaired flexibility  Visit Diagnosis: Chronic left shoulder  pain  Stiffness of left shoulder, not elsewhere classified     Problem List Patient  Active Problem List   Diagnosis Date Noted  . Nausea 07/27/2018  . Clavicle fracture, shaft 07/27/2018  . Osteopenia 12/20/2015  . Allergy to cats 12/19/2015  . Asthma, mild intermittent 12/19/2015  . Hyperlipidemia 09/19/2014  . Hot flashes 09/19/2014    Megan Hooper, Megan Hooper, Megan Hooper 2:23 PM  01/05/19    [facility] Outpatient Rehabilitation [facility] [address] [place], [place], [address] Phone: [phone]   Fax:  [phone]  Name: Megan Hooper MRN: [id number] Date of Birth: 1955-05-02

## 2019-01-06 ENCOUNTER — Other Ambulatory Visit: Payer: Self-pay

## 2019-01-06 ENCOUNTER — Ambulatory Visit (INDEPENDENT_AMBULATORY_CARE_PROVIDER_SITE_OTHER): Payer: BC Managed Care – PPO | Admitting: Family Medicine

## 2019-01-06 ENCOUNTER — Encounter: Payer: Self-pay | Admitting: Family Medicine

## 2019-01-06 VITALS — BP 123/84 | HR 74 | Temp 97.4°F | Wt 157.2 lb

## 2019-01-06 DIAGNOSIS — H81399 Other peripheral vertigo, unspecified ear: Secondary | ICD-10-CM | POA: Diagnosis not present

## 2019-01-06 DIAGNOSIS — R42 Dizziness and giddiness: Secondary | ICD-10-CM | POA: Diagnosis not present

## 2019-01-06 DIAGNOSIS — Z23 Encounter for immunization: Secondary | ICD-10-CM | POA: Diagnosis not present

## 2019-01-06 MED ORDER — MECLIZINE HCL 25 MG PO TABS: 25.0000 mg | ORAL_TABLET | Freq: Three times a day (TID) | ORAL | 1 refills | Status: DC | PRN

## 2019-01-06 NOTE — Patient Instructions (Addendum)
I am glad to hear that things are improving.  Physical therapy for vertigo may be helpful, please take prescription with you to that appointment.  Continue meclizine as needed for now.  Please let me know if there are questions.   Return to the clinic or go to the nearest emergency room if any of your symptoms worsen or new symptoms occur.   Vertigo Vertigo is the feeling that you or your surroundings are moving when they are not. This feeling can come and go at any time. Vertigo often goes away on its own. Vertigo can be dangerous if it occurs while you are doing something that could endanger you or others, such as driving or operating machinery. Your health care provider will do tests to determine the cause of your vertigo. Tests will also help your health care provider decide how best to treat your condition. Follow these instructions at home: Eating and drinking      Drink enough fluid to keep your urine pale yellow.  Do not drink alcohol. Activity  Return to your normal activities as told by your health care provider. Ask your health care provider what activities are safe for you.  In the morning, first sit up on the side of the bed. When you feel okay, stand slowly while you hold onto something until you know that your balance is fine.  Move slowly. Avoid sudden body or head movements or certain positions, as told by your health care provider.  If you have trouble walking or keeping your balance, try using a cane for stability. If you feel dizzy or unstable, sit down right away.  Avoid doing any tasks that would cause danger to you or others if vertigo occurs.  Avoid bending down if you feel dizzy. Place items in your home so that they are easy for you to reach without leaning over.  Do not drive or use heavy machinery if you feel dizzy. General instructions  Take over-the-counter and prescription medicines only as told by your health care provider.  Keep all follow-up  visits as told by your health care provider. This is important. Contact a health care provider if:  Your medicines do not relieve your vertigo or they make it worse.  You have a fever.  Your condition gets worse or you develop new symptoms.  Your family or friends notice any behavioral changes.  Your nausea or vomiting gets worse.  You have numbness or a prickling and tingling sensation in part of your body. Get help right away if you:  Have difficulty moving or speaking.  Are always dizzy.  Faint.  Develop severe headaches.  Have weakness in your hands, arms, or legs.  Have changes in your hearing or vision.  Develop a stiff neck.  Develop sensitivity to light. Summary  Vertigo is the feeling that you or your surroundings are moving when they are not.  Your health care provider will do tests to determine the cause of your vertigo.  Follow instructions for home care. You may be told to avoid certain tasks, positions, or movements.  Contact a health care provider if your medicines do not relieve your symptoms, or if you have a fever, nausea, vomiting, or changes in behavior.  Get help right away if you have severe headaches or difficulty speaking, or you develop hearing or vision problems. This information is not intended to replace advice given to you by your health care provider. Make sure you discuss any questions you have with your  health care provider. Document Released: 12/25/2004 Document Revised: 02/08/2018 Document Reviewed: 02/08/2018 Elsevier Patient Education  [employer].   If you have lab work done today you will be contacted with your lab results within the next 2 weeks.  If you have not heard from [place] then please contact us. The fastest way to get your results is to register for My Chart.   IF you received an x-ray today, you will receive an invoice from [facility]. Please contact [facility] at [phone] with questions or  concerns regarding your invoice.   IF you received labwork today, you will receive an invoice from [place]. Please contact [facility] at [phone] with questions or concerns regarding your invoice.   Our billing staff will not be able to assist you with questions regarding bills from these companies.  You will be contacted with the lab results as soon as they are available. The fastest way to get your results is to activate your My Chart account. Instructions are located on the last page of this paperwork. If you have not heard from [place] regarding the results in 2 weeks, please contact this office.

## 2019-01-06 NOTE — Progress Notes (Signed)
Subjective:    Patient ID: Megan Hooper, female    DOB: 31-Dec-1955, 63 y.o.   MRN: [id number]  HPI Megan Hooper is a 63 y.o. female Presents today for: Chief Complaint  Patient presents with  . Dizziness    2 week f/u for dizziness. Need a referral to see my physical therapist for the virtigo- Would like to [name] at [place]   Dizziness: Discussed at September 23 visit.  Nonfocal neuro exam.  Exam and history was suspicious for peripheral vertigo.  Home Epley maneuver discussed with potential risks, meclizine 25 mg tablets prescribed if needed.  She is currently undergoing physical therapy for her shoulder but there is also a therapist in that office that specializes in vestibular rehab, requests referral.  Has not tried home Epley maneuver, would like to wait until rehab.  Meclizine helping - up to 4 times per day. Sometimes 3 times per day.  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   Overall is doing better since last visit.  Physical therapy is helping her shoulder pain.  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  Feels like it is a good fit. Had cataract procedure.    Results for orders placed or performed in visit on 0000000  Basic metabolic panel  Result Value Ref Range   Glucose 111 (H) 65 - 99 mg/dL   BUN 14 8 - 27 mg/dL   Creatinine, Ser 0.66 0.57 - 1.00 mg/dL   GFR calc non Af Amer 96 >59 mL/min/1.73   GFR calc Af Amer 110 >59 mL/min/1.73   BUN/Creatinine Ratio 21 12 - 28   Sodium 140 134 - 144 mmol/L   Potassium 4.2 3.5 - 5.2 mmol/L   Chloride 102 96 - 106 mmol/L   CO2 23 20 - 29 mmol/L   Calcium 10.0 8.7 - 10.3 mg/dL  TSH  Result Value Ref Range   TSH 1.790 0.450 - 4.500 uIU/mL  POCT CBC  Result Value Ref Range   WBC 5.1 4.6 - 10.2 K/uL   Lymph, poc 1.4 0.6 - 3.4   POC LYMPH PERCENT 27.1 10 - 50 %L   MID (cbc) 0.3 0 - 0.9   POC MID % 5.9 0  - 12 %M   POC Granulocyte 3.4 2 - 6.9   Granulocyte percent 67.0 37 - 80 %G   RBC 5.13 4.04 - 5.48 M/uL   Hemoglobin 14.4 11 - 14.6 g/dL   HCT, POC 43.3 (A) 29 - 41 %   MCV 84.4 (A) 76 - 111 fL   MCH, POC 28.0 27 - 31.2 pg   MCHC 33.2 31.8 - 35.4 g/dL   RDW, POC 13.6 %   Platelet Count, POC 313 142 - 424 K/uL   MPV 7.7 0 - 99.8 fL  POCT glucose (manual entry)  Result Value Ref Range   POC Glucose 100 (A) 70 - 99 mg/dl   Depression screen [facility] 2/9 01/06/2019 12/22/2018 07/16/2018 03/25/2018 08/26/2017  Decreased Interest 0 1 0 0 0  Down, Depressed, Hopeless 0 1 0 0 0  PHQ - 2 Score 0 2 0 0 0  Altered sleeping - 1 - - -  Tired, decreased energy - 3 - - -  Change in appetite - 0 - - -  Feeling bad or failure about yourself  - 3 - - -  Trouble concentrating -  1 - - -  Moving slowly or fidgety/restless - 0 - - -  Suicidal thoughts - 0 - - -  PHQ-9 Score - 10 - - -  Difficult doing work/chores - Somewhat difficult - - -     Patient Active Problem List   Diagnosis Date Noted  . Nausea 07/27/2018  . Clavicle fracture, shaft 07/27/2018  . Osteopenia 12/20/2015  . Allergy to cats 12/19/2015  . Asthma, mild intermittent 12/19/2015  . Hyperlipidemia 09/19/2014  . Hot flashes 09/19/2014   Past Medical History:  Diagnosis Date  . Allergy   . Anemia   . Asthma   . Clavicle fracture    Left  . Dyslipidemia   . Leukopenia    mild  . Osteopenia   . Overactive bladder    Past Surgical History:  Procedure Laterality Date  . ABDOMINAL HYSTERECTOMY    . ORIF CLAVICULAR FRACTURE Left 07/27/2018   OPEN REDUCTION INTERNAL FIXATION (ORIF) LEFT CLAVICULAR FRACTURE AND ILIAC CREST BONE MARROW ASPIRATION FOR GRAFTING  . ORIF CLAVICULAR FRACTURE Left 07/27/2018   Procedure: OPEN REDUCTION INTERNAL FIXATION (ORIF) LEFT CLAVICULAR FRACTURE AND POSSIBLE BONE MARROW ASPIRATION FOR GRAFTING;  Surgeon: Meredith Pel, MD;  Location: [place];  Service: Orthopedics;  Laterality: Left;    Allergies  Allergen Reactions  . Codeine Nausea And Vomiting   Prior to Admission medications   Medication Sig Start Date End Date Taking? Authorizing Provider  albuterol (PROVENTIL HFA;VENTOLIN HFA) 108 (90 Base) MCG/ACT inhaler Inhale 1-2 puffs into the lungs every 4 (four) hours as needed for wheezing or shortness of breath. 08/26/17  Yes McVey, Gelene Mink, PA-C  ibuprofen (ADVIL) 800 MG tablet Take 1 tablet (800 mg total) by mouth 2 (two) times daily as needed. 07/22/18  Yes Meredith Pel, MD  meclizine (ANTIVERT) 25 MG tablet Take 1 tablet (25 mg total) by mouth 3 (three) times daily as needed for dizziness. 12/22/18  Yes [name], MD  mirabegron ER (MYRBETRIQ) 50 MG TB24 tablet Take 50 mg by mouth daily.   Yes [provider]   Social History   Socioeconomic History  . Marital status: Divorced    Spouse name: Not on file  . Number of children: Not on file  . Years of education: Not on file  . Highest education level: Not on file  Occupational History  . Occupation: Pharmacist, hospital  Social Needs  . Financial resource strain: Not on file  . Food insecurity    Worry: Not on file    Inability: Not on file  . Transportation needs    Medical: Not on file    Non-medical: Not on file  Tobacco Use  . Smoking status: Former Smoker    Packs/day: 0.50    Years: 13.00    Pack years: 6.50    Types: Cigarettes    Start date: 05/26/1978    Quit date: 10/22/1991    Years since quitting: 27.2  . Smokeless tobacco: Never Used  . Tobacco comment: quit  13 years ago  Substance and Sexual Activity  . Alcohol use: No    Alcohol/week: 0.0 standard drinks  . Drug use: No  . Sexual activity: Not Currently  Lifestyle  . Physical activity    Days per week: Not on file    Minutes per session: Not on file  . Stress: Not on file  Relationships  . Social Herbalist on phone: Not on file    Gets together: Not  on file    Attends religious service: Not on file     Active member of club or organization: Not on file    Attends meetings of clubs or organizations: Not on file    Relationship status: Not on file  . Intimate partner violence    Fear of current or ex partner: Not on file    Emotionally abused: Not on file    Physically abused: Not on file    Forced sexual activity: Not on file  Other Topics Concern  . Not on file  Social History Narrative  . Not on file    Review of Systems     Objective:   Physical Exam Vitals signs reviewed.  Constitutional:      Appearance: She is well-developed.  HENT:     Head: Normocephalic and atraumatic.  Eyes:     Extraocular Movements: Extraocular movements intact.     Right eye: No nystagmus.     Left eye: No nystagmus.     Conjunctiva/sclera: Conjunctivae normal.     Pupils: Pupils are equal, round, and reactive to light.  Neck:     Vascular: No carotid bruit.  Cardiovascular:     Rate and Rhythm: Normal rate and regular rhythm.     Heart sounds: Normal heart sounds.  Pulmonary:     Effort: Pulmonary effort is normal.     Breath sounds: Normal breath sounds.  Abdominal:     Palpations: Abdomen is soft. There is no pulsatile mass.     Tenderness: There is no abdominal tenderness.  Skin:    General: Skin is warm and dry.  Neurological:     General: No focal deficit present.     Mental Status: She is alert. She is disoriented.     Cranial Nerves: No cranial nerve deficit.     Motor: No weakness.     Coordination: Coordination normal.     Gait: Gait normal.  Psychiatric:        Behavior: Behavior normal.    Vitals:   01/06/19 0951  BP: 123/84  Pulse: 74  Temp: (!) 97.4 F (36.3 C)  TempSrc: Oral  SpO2: 98%  Weight: 157 lb 3.2 oz (71.3 kg)         Assessment & Plan:    SOLEA OLP is a 63 y.o. female Peripheral vertigo, unspecified laterality  Need for prophylactic vaccination and inoculation against influenza - Plan: Flu Vaccine QUAD 6+ mos PF IM (Fluarix Quad PF)   Vertigo - Plan: meclizine (ANTIVERT) 25 MG tablet  Improving vertigo.  Other issues from last visit also improving including shoulder pain and adjustment disorder.  Continue meclizine, prescription given for physical therapy for vestibular rehab and instruction on home Epley maneuver if needed.  RTC precautions given.  Meds ordered this encounter  Medications  . meclizine (ANTIVERT) 25 MG tablet    Sig: Take 1 tablet (25 mg total) by mouth 3 (three) times daily as needed for dizziness.    Dispense:  30 tablet    Refill:  1   Patient Instructions   I am glad to hear that things are improving.  Physical therapy for vertigo may be helpful, please take prescription with you to that appointment.  Continue meclizine as needed for now.  Please let me know if there are questions.   Return to the clinic or go to the nearest emergency room if any of your symptoms worsen or new symptoms occur.   Vertigo Vertigo is the  feeling that you or your surroundings are moving when they are not. This feeling can come and go at any time. Vertigo often goes away on its own. Vertigo can be dangerous if it occurs while you are doing something that could endanger you or others, such as driving or operating machinery. Your health care provider will do tests to determine the cause of your vertigo. Tests will also help your health care provider decide how best to treat your condition. Follow these instructions at home: Eating and drinking      Drink enough fluid to keep your urine pale yellow.  Do not drink alcohol. Activity  Return to your normal activities as told by your health care provider. Ask your health care provider what activities are safe for you.  In the morning, first sit up on the side of the bed. When you feel okay, stand slowly while you hold onto something until you know that your balance is fine.  Move slowly. Avoid sudden body or head movements or certain positions, as told by your  health care provider.  If you have trouble walking or keeping your balance, try using a cane for stability. If you feel dizzy or unstable, sit down right away.  Avoid doing any tasks that would cause danger to you or others if vertigo occurs.  Avoid bending down if you feel dizzy. Place items in your home so that they are easy for you to reach without leaning over.  Do not drive or use heavy machinery if you feel dizzy. General instructions  Take over-the-counter and prescription medicines only as told by your health care provider.  Keep all follow-up visits as told by your health care provider. This is important. Contact a health care provider if:  Your medicines do not relieve your vertigo or they make it worse.  You have a fever.  Your condition gets worse or you develop new symptoms.  Your family or friends notice any behavioral changes.  Your nausea or vomiting gets worse.  You have numbness or a prickling and tingling sensation in part of your body. Get help right away if you:  Have difficulty moving or speaking.  Are always dizzy.  Faint.  Develop severe headaches.  Have weakness in your hands, arms, or legs.  Have changes in your hearing or vision.  Develop a stiff neck.  Develop sensitivity to light. Summary  Vertigo is the feeling that you or your surroundings are moving when they are not.  Your health care provider will do tests to determine the cause of your vertigo.  Follow instructions for home care. You may be told to avoid certain tasks, positions, or movements.  Contact a health care provider if your medicines do not relieve your symptoms, or if you have a fever, nausea, vomiting, or changes in behavior.  Get help right away if you have severe headaches or difficulty speaking, or you develop hearing or vision problems. This information is not intended to replace advice given to you by your health care provider. Make sure you discuss any  questions you have with your health care provider. Document Released: 12/25/2004 Document Revised: 02/08/2018 Document Reviewed: 02/08/2018 Elsevier Patient Education  [employer].   If you have lab work done today you will be contacted with your lab results within the next 2 weeks.  If you have not heard from [place] then please contact us. The fastest way to get your results is to register for My Chart.   IF  you received an x-ray today, you will receive an invoice from [facility]. Please contact [facility] at [phone] with questions or concerns regarding your invoice.   IF you received labwork today, you will receive an invoice from [place]. Please contact [facility] at [phone] with questions or concerns regarding your invoice.   Our billing staff will not be able to assist you with questions regarding bills from these companies.  You will be contacted with the lab results as soon as they are available. The fastest way to get your results is to activate your My Chart account. Instructions are located on the last page of this paperwork. If you have not heard from [place] regarding the results in 2 weeks, please contact this office.       Signed,   [name], MD [facility] [place].  01/06/19 10:22 AM

## 2019-01-07 ENCOUNTER — Encounter: Payer: Self-pay | Admitting: Physical Therapy

## 2019-01-07 ENCOUNTER — Ambulatory Visit (INDEPENDENT_AMBULATORY_CARE_PROVIDER_SITE_OTHER): Payer: BC Managed Care – PPO | Admitting: Physical Therapy

## 2019-01-07 ENCOUNTER — Ambulatory Visit: Payer: BC Managed Care – PPO | Admitting: Physical Therapy

## 2019-01-07 DIAGNOSIS — H8111 Benign paroxysmal vertigo, right ear: Secondary | ICD-10-CM

## 2019-01-07 NOTE — Therapy (Signed)
[place]  [place], [place], [address] Phone: [phone]   Fax:  [phone]  Physical Therapy Re-Evaluation  Patient Details  Name: Megan Hooper MRN: [id number] Date of Birth: 1955-05-31 Referring Provider (PT): Corliss Parish, MD   Encounter Date: 01/07/2019  PT End of Session - 01/07/19 1002    Visit Number  4    Number of Visits  12    Date for PT Re-Evaluation  02/09/19    Authorization Type  BCBS    PT Start Time  0927    PT Stop Time  1000    PT Time Calculation (min)  33 min    Activity Tolerance  Patient tolerated treatment well    Behavior During Therapy  [facility] for tasks assessed/performed       Past Medical History:  Diagnosis Date  . Allergy   . Anemia   . Asthma   . Clavicle fracture    Left  . Dyslipidemia   . Leukopenia    mild  . Osteopenia   . Overactive bladder     Past Surgical History:  Procedure Laterality Date  . ABDOMINAL HYSTERECTOMY    . ORIF CLAVICULAR FRACTURE Left 07/27/2018   OPEN REDUCTION INTERNAL FIXATION (ORIF) LEFT CLAVICULAR FRACTURE AND ILIAC CREST BONE MARROW ASPIRATION FOR GRAFTING  . ORIF CLAVICULAR FRACTURE Left 07/27/2018   Procedure: OPEN REDUCTION INTERNAL FIXATION (ORIF) LEFT CLAVICULAR FRACTURE AND POSSIBLE BONE MARROW ASPIRATION FOR GRAFTING;  Surgeon: Meredith Pel, MD;  Location: [place];  Service: Orthopedics;  Laterality: Left;    There were no vitals filed for this visit.   Subjective Assessment - 01/07/19 0931    Subjective  Pt is a 63 y/o female who presents to OPPT with vertigo x 4 weeks.  Pt reports rolling over in bed, and getting up as well as sudden movements provokes symptoms.  Pt rx meclizine, taking most recently at 7AM this morning.    Limitations  Lifting;Writing;House hold activities    Patient Stated Goals  Decreased pain, improved movment.; improve dizziness    Currently in Pain?  No/denies         [facility] PT  Assessment - 01/07/19 0933      Assessment   Medical Diagnosis  vertigo    Referring Provider (PT)  Corliss Parish, MD    Onset Date/Surgical Date  --   4 weeks   Hand Dominance  Right    Next MD Visit  PRN    Prior Therapy  currently at [facility] for Lt shoulder pain      Precautions   Precautions  None      Restrictions   Weight Bearing Restrictions  No      Balance Screen   Has the patient fallen in the past 6 months  Yes    How many times?  1    Has the patient had a decrease in activity level because of a fear of falling?   No    Is the patient reluctant to leave their home because of a fear of falling?   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)    Leisure  gardening, painting           Vestibular Assessment - 01/07/19 0936      Vestibular  Assessment   General Observation  no symptoms at rest      Symptom Behavior   Type of Dizziness   Imbalance;Spinning   head swimming, nausea   Frequency of Dizziness  multiple times throughout the day    Duration of Dizziness  couple minutes    Symptom Nature  Motion provoked;Positional    Aggravating Factors  Rolling to right;Rolling to left;Turning head quickly   head down to up   Relieving Factors  Lying supine;Medication    Progression of Symptoms  Better   less spinning     Oculomotor Exam   Ocular ROM  WNL    Spontaneous  Absent    Gaze-induced   Absent    Smooth Pursuits  Intact    Saccades  Slow   increase in symptoms bil     Oculomotor Exam-Fixation Suppressed    Left Head Impulse  pt closing eyes    Right Head Impulse  pt closing eyes      Vestibulo-Ocular Reflex   VOR 1 Head Only (x 1 viewing)  WNL with increase in symptoms      Positional Testing   Dix-Hallpike  Dix-Hallpike Right;Dix-Hallpike Left    Horizontal Canal Testing  --      Dix-Hallpike Right    Dix-Hallpike Right Duration  6 sec    Dix-Hallpike Right Symptoms  Upbeat, right rotatory nystagmus      Dix-Hallpike Left   Dix-Hallpike Left Duration  none    Dix-Hallpike Left Symptoms  No nystagmus          Objective measurements completed on examination: See above findings.       Vestibular Treatment/Exercise - 01/07/19 1001      Vestibular Treatment/Exercise   Vestibular Treatment Provided  Canalith Repositioning    Canalith Repositioning  Epley Manuever Right       EPLEY MANUEVER RIGHT   Number of Reps   2    Overall Response  Symptoms Resolved            PT Education - 01/07/19 1002    Education Details  BPPV    Person(s) Educated  Patient    Methods  Explanation;Handout    Comprehension  Verbalized understanding       PT Short Term Goals - 01/04/19 0934      PT SHORT TERM GOAL #1   Title  Pt to be independent with initial HEP    Time  2    Period  Weeks    Status  On-going    Target Date  01/12/19      PT SHORT TERM GOAL #2   Title  Pt to demo improved PROM for shoulder flex by at least 10 degrees    Baseline  AROM left shoulder flexion 115 today    Time  2    Period  Weeks    Status  Achieved    Target Date  01/12/19        PT Long Term Goals - 01/07/19 1004      PT LONG TERM GOAL #1   Title  Pt to be independent with final HEP    Time  6    Period  Weeks    Status  New    Target Date  02/09/19      PT LONG TERM GOAL #2   Title  Pt to demo improved PROM and AROM to be WNL for L shoulder, to improve ability for ADLs and IADLs.  Time  6    Period  Weeks    Status  New    Target Date  02/09/19      PT LONG TERM GOAL #3   Title  Pt to demo improved strength of L Shoulder to at least 4/5 for elevation, and 4+/5 for rotation,  to improve ability for IADLs.    Time  6    Period  Weeks    Status  New    Target Date  02/09/19      PT LONG TERM GOAL #4   Title  Pt to demo decreased pain in L shoulder to 0-2/10 with activity     Time  6    Period  Weeks    Status  New    Target Date  02/09/19      PT LONG TERM GOAL #5   Title  report resolve of vertigo    Status  New    Target Date  02/09/19             Plan - 01/07/19 1002    Clinical Impression Statement  Pt presents to OPPT today for vestibular eval.  Pt with Rt pBPPV resolved x 2 reps of Epley's.  Anticipate resolve of BPPV, and pt will notify PT if symptoms return.  Will benefit from PT to address additional vestibular symptoms.    Personal Factors and Comorbidities  Past/Current Experience;Time since onset of injury/illness/exacerbation    Examination-Activity Limitations  Reach Overhead;Carry;Lift;Sleep    Examination-Participation Restrictions  Cleaning;Meal Prep;Yard Work;Community Activity;Driving;Laundry;Shop    Stability/Clinical Decision Making  Stable/Uncomplicated    Rehab Potential  Good    PT Frequency  2x / week    PT Duration  6 weeks    PT Treatment/Interventions  ADLs/Self Care Home Management;Cryotherapy;Electrical Stimulation;DME Instruction;Ultrasound;Moist Heat;Iontophoresis 4mg /ml Dexamethasone;Functional mobility training;Therapeutic activities;Therapeutic exercise;Patient/family education;Neuromuscular re-education;Manual techniques;Scar mobilization;Taping;Dry needling;Passive range of motion;Joint Manipulations;Vasopneumatic Device;Canalith Repostioning;Vestibular    PT Next Visit Plan  continue shoulder rehab per POC, assess vestibular system PRN    PT Home Exercise Plan  [id number]    Consulted and Agree with Plan of Care  Patient       Patient will benefit from skilled therapeutic intervention in order to improve the following deficits and impairments:  Pain, Increased muscle spasms, Decreased scar mobility, Decreased mobility, Decreased activity tolerance, Decreased endurance, Decreased range of motion, Decreased strength, Impaired UE functional use, Impaired flexibility  Visit Diagnosis: BPPV (benign paroxysmal positional  vertigo), right - Plan: PT plan of care cert/re-cert     Problem List Patient Active Problem List   Diagnosis Date Noted  . Nausea 07/27/2018  . Clavicle fracture, shaft 07/27/2018  . Osteopenia 12/20/2015  . Allergy to cats 12/19/2015  . Asthma, mild intermittent 12/19/2015  . Hyperlipidemia 09/19/2014  . Hot flashes 09/19/2014      Laureen Abrahams, PT, DPT 01/07/19 10:07 AM     [facility] [place], [place], [address] Phone: [phone]   Fax:  [phone]  Name: Megan Hooper MRN: [id number] Date of Birth: 06/17/1955

## 2019-01-10 ENCOUNTER — Encounter: Payer: Self-pay | Admitting: Family Medicine

## 2019-01-12 ENCOUNTER — Ambulatory Visit: Payer: BC Managed Care – PPO | Admitting: Physical Therapy

## 2019-01-14 ENCOUNTER — Ambulatory Visit: Payer: BC Managed Care – PPO | Admitting: Physical Therapy

## 2019-01-18 ENCOUNTER — Ambulatory Visit: Payer: BC Managed Care – PPO

## 2019-01-18 ENCOUNTER — Other Ambulatory Visit: Payer: Self-pay

## 2019-01-18 DIAGNOSIS — M25612 Stiffness of left shoulder, not elsewhere classified: Secondary | ICD-10-CM | POA: Diagnosis not present

## 2019-01-18 DIAGNOSIS — G8929 Other chronic pain: Secondary | ICD-10-CM

## 2019-01-18 DIAGNOSIS — M25512 Pain in left shoulder: Secondary | ICD-10-CM

## 2019-01-18 NOTE — Therapy (Signed)
[place], [place], [address] Phone: [phone]   Fax:  [phone]  Physical Therapy Treatment  Patient Details  Name: Megan Hooper MRN: [id number] Date of Birth: 14-May-1955 Referring Provider (PT): Corliss Parish, MD   Encounter Date: 01/18/2019  PT End of Session - 01/18/19 0700    Visit Number  5    Number of Visits  12    Date for PT Re-Evaluation  02/09/19    Authorization Type  BCBS    PT Start Time  0700    PT Stop Time  0740    PT Time Calculation (min)  40 min    Activity Tolerance  Patient tolerated treatment well    Behavior During Therapy  [facility] for tasks assessed/performed       Past Medical History:  Diagnosis Date  . Allergy   . Anemia   . Asthma   . Clavicle fracture    Left  . Dyslipidemia   . Leukopenia    mild  . Osteopenia   . Overactive bladder     Past Surgical History:  Procedure Laterality Date  . ABDOMINAL HYSTERECTOMY    . ORIF CLAVICULAR FRACTURE Left 07/27/2018   OPEN REDUCTION INTERNAL FIXATION (ORIF) LEFT CLAVICULAR FRACTURE AND ILIAC CREST BONE MARROW ASPIRATION FOR GRAFTING  . ORIF CLAVICULAR FRACTURE Left 07/27/2018   Procedure: OPEN REDUCTION INTERNAL FIXATION (ORIF) LEFT CLAVICULAR FRACTURE AND POSSIBLE BONE MARROW ASPIRATION FOR GRAFTING;  Surgeon: Meredith Pel, MD;  Location: [place];  Service: Orthopedics;  Laterality: Left;    There were no vitals filed for this visit.  Subjective Assessment - 01/18/19 0703    Subjective  She reports stiffness with alittle bit of pain.   Vertigo treatment worked in one session.  Pulling down car trunk and overhead activity still difficult.    Currently in Pain?  No/denies    Aggravating Factors   reaching out to side.         [facility] PT Assessment - 01/18/19 0001      AROM   Left Shoulder Flexion  115 Degrees    Left Shoulder ABduction  96 Degrees    Left Shoulder Internal Rotation  60 Degrees     Left Shoulder External Rotation  35 Degrees   shoulder forward of sagital plane   Left Shoulder Horizontal ABduction  5 Degrees    Left Shoulder Horizontal ADduction  100 Degrees      PROM   Left Shoulder Flexion  120 Degrees    Left Shoulder External Rotation  42 Degrees                   [facility] Adult PT Treatment/Exercise - 01/18/19 0001      Shoulder Exercises: Standing   Extension  Both;20 reps;Theraband    Theraband Level (Shoulder Extension)  Level 2 (Red)    Row  20 reps    Theraband Level (Shoulder Row)  Level 2 (Red)      Shoulder Exercises: Pulleys   Flexion  2 minutes    Scaption  2 minutes      Shoulder Exercises: ROM/Strengthening   UBE (Upper Arm Bike)  L1 5 min      Manual Therapy   Joint Mobilization  Grade 2/3 GHJ AP and inf glide, scap mobs    Soft tissue mobilization  sub scap release    Passive ROM  L GHJ, all motions  PT Short Term Goals - 01/18/19 0708      PT SHORT TERM GOAL #1   Title  Pt to be independent with initial HEP    Status  Achieved      PT SHORT TERM GOAL #2   Title  Pt to demo improved PROM for shoulder flex by at least 10 degrees    Status  Achieved        PT Long Term Goals - 01/18/19 0709      PT LONG TERM GOAL #1   Title  Pt to be independent with final HEP    Status  On-going      PT LONG TERM GOAL #2   Title  Pt to demo improved PROM and AROM to be WNL for L shoulder, to improve ability for ADLs and IADLs.    Status  On-going      PT LONG TERM GOAL #3   Title  Pt to demo improved strength of L Shoulder to at least 4/5 for elevation, and 4+/5 for rotation,  to improve ability for IADLs.    Status  On-going      PT LONG TERM GOAL #4   Title  Pt to demo decreased pain in L shoulder to 0-2/10 with activity    Baseline  highest pain in past week 7/10 rolling in bed.    Status  On-going      PT LONG TERM GOAL #5   Title  report resolve of vertigo    Status  Achieved             Plan - 01/18/19 0700    Clinical Impression Statement  ROM only slightly better . Vertigo resolved. appears functionally overhead is limited activity. Post session active flexion 120 degrees    PT Treatment/Interventions  ADLs/Self Care Home Management;Cryotherapy;Electrical Stimulation;DME Instruction;Ultrasound;Moist Heat;Iontophoresis 4mg /ml Dexamethasone;Functional mobility training;Therapeutic activities;Therapeutic exercise;Patient/family education;Neuromuscular re-education;Manual techniques;Scar mobilization;Taping;Dry needling;Passive range of motion;Joint Manipulations;Vasopneumatic Device;Canalith Repostioning;Vestibular    PT Next Visit Plan  continue shoulder rehab per POC, assess vestibular system PRN    PT Home Exercise Plan  [id number]    Consulted and Agree with Plan of Care  Patient       Patient will benefit from skilled therapeutic intervention in order to improve the following deficits and impairments:  Pain, Increased muscle spasms, Decreased scar mobility, Decreased mobility, Decreased activity tolerance, Decreased endurance, Decreased range of motion, Decreased strength, Impaired UE functional use, Impaired flexibility  Visit Diagnosis: Chronic left shoulder pain  Stiffness of left shoulder, not elsewhere classified     Problem List Patient Active Problem List   Diagnosis Date Noted  . Nausea 07/27/2018  . Clavicle fracture, shaft 07/27/2018  . Osteopenia 12/20/2015  . Allergy to cats 12/19/2015  . Asthma, mild intermittent 12/19/2015  . Hyperlipidemia 09/19/2014  . Hot flashes 09/19/2014    [name]  PT 01/18/2019, 7:49 AM  [facility] [address] [place], [place], [address] Phone: [phone]   Fax:  [phone]  Name: Megan Hooper MRN: [id number] Date of Birth: 09-24-1955

## 2019-01-19 ENCOUNTER — Encounter: Payer: BC Managed Care – PPO | Admitting: Physical Therapy

## 2019-01-21 ENCOUNTER — Encounter: Payer: BC Managed Care – PPO | Admitting: Physical Therapy

## 2019-01-21 DIAGNOSIS — R350 Frequency of micturition: Secondary | ICD-10-CM | POA: Diagnosis not present

## 2019-01-21 DIAGNOSIS — N3946 Mixed incontinence: Secondary | ICD-10-CM | POA: Diagnosis not present

## 2019-01-24 ENCOUNTER — Other Ambulatory Visit: Payer: Self-pay

## 2019-01-24 ENCOUNTER — Ambulatory Visit: Payer: BC Managed Care – PPO

## 2019-01-24 DIAGNOSIS — M25612 Stiffness of left shoulder, not elsewhere classified: Secondary | ICD-10-CM

## 2019-01-24 DIAGNOSIS — M25512 Pain in left shoulder: Secondary | ICD-10-CM | POA: Diagnosis not present

## 2019-01-24 DIAGNOSIS — G8929 Other chronic pain: Secondary | ICD-10-CM | POA: Diagnosis not present

## 2019-01-24 NOTE — Therapy (Signed)
[place], [place], [address] Phone: [phone]   Fax:  [phone]  Physical Therapy Treatment  Patient Details  Name: NAYELLI RINDELS MRN: [id number] Date of Birth: [birth year] Referring Provider (PT): Corliss Parish, MD   Encounter Date: 01/24/2019  PT End of Session - 01/24/19 0711    Visit Number  6    Number of Visits  12    Date for PT Re-Evaluation  02/09/19    Authorization Type  BCBS    PT Start Time  0710   late   PT Stop Time  0740    PT Time Calculation (min)  30 min    Activity Tolerance  Patient tolerated treatment well    Behavior During Therapy  [facility] for tasks assessed/performed       Past Medical History:  Diagnosis Date  . Allergy   . Anemia   . Asthma   . Clavicle fracture    Left  . Dyslipidemia   . Leukopenia    mild  . Osteopenia   . Overactive bladder     Past Surgical History:  Procedure Laterality Date  . ABDOMINAL HYSTERECTOMY    . ORIF CLAVICULAR FRACTURE Left 07/27/2018   OPEN REDUCTION INTERNAL FIXATION (ORIF) LEFT CLAVICULAR FRACTURE AND ILIAC CREST BONE MARROW ASPIRATION FOR GRAFTING  . ORIF CLAVICULAR FRACTURE Left 07/27/2018   Procedure: OPEN REDUCTION INTERNAL FIXATION (ORIF) LEFT CLAVICULAR FRACTURE AND POSSIBLE BONE MARROW ASPIRATION FOR GRAFTING;  Surgeon: Meredith Pel, MD;  Location: [place];  Service: Orthopedics;  Laterality: Left;    There were no vitals filed for this visit.  Subjective Assessment - 01/24/19 0709    Subjective  About the same    Currently in Pain?  No/denies         [facility] PT Assessment - 01/24/19 0001      AROM   Left Shoulder Flexion  120 Degrees    Left Shoulder ABduction  103 Degrees                   [facility] Adult PT Treatment/Exercise - 01/24/19 0001      Shoulder Exercises: Supine   Horizontal ABduction Limitations  25 reps with dowel    External Rotation Limitations  dowell  x 25 reps    Flexion Limitations  cane  25 reps      Shoulder Exercises: Prone   Flexion  Left;20 reps    Internal Rotation  20 reps;Left    Internal Rotation Limitations  reaching to buttoks    Horizontal ABduction 1  Left;20 reps      Shoulder Exercises: Standing   Extension  Both;20 reps;Theraband    Theraband Level (Shoulder Extension)  Level 2 (Red)    Row  20 reps    Theraband Level (Shoulder Row)  Level 2 (Red)      Shoulder Exercises: Pulleys   Flexion  2 minutes    Scaption  2 minutes      Shoulder Exercises: ROM/Strengthening   UBE (Upper Arm Bike)  L2  5 min               PT Short Term Goals - 01/18/19 0708      PT SHORT TERM GOAL #1   Title  Pt to be independent with initial HEP    Status  Achieved      PT SHORT TERM GOAL #2   Title  Pt to demo improved PROM for shoulder flex  by at least 10 degrees    Status  Achieved        PT Long Term Goals - 01/18/19 0709      PT LONG TERM GOAL #1   Title  Pt to be independent with final HEP    Status  On-going      PT LONG TERM GOAL #2   Title  Pt to demo improved PROM and AROM to be WNL for L shoulder, to improve ability for ADLs and IADLs.    Status  On-going      PT LONG TERM GOAL #3   Title  Pt to demo improved strength of L Shoulder to at least 4/5 for elevation, and 4+/5 for rotation,  to improve ability for IADLs.    Status  On-going      PT LONG TERM GOAL #4   Title  Pt to demo decreased pain in L shoulder to 0-2/10 with activity    Baseline  highest pain in past week 7/10 rolling in bed.    Status  On-going      PT LONG TERM GOAL #5   Title  report resolve of vertigo    Status  Achieved            Plan - 01/24/19 0712    Clinical Impression Statement  Measureable incr ROM at end of session.   Pain limits but she is tolerant of stretching.    PT Treatment/Interventions  ADLs/Self Care Home Management;Cryotherapy;Electrical Stimulation;DME Instruction;Ultrasound;Moist Heat;Iontophoresis 4mg /ml  Dexamethasone;Functional mobility training;Therapeutic activities;Therapeutic exercise;Patient/family education;Neuromuscular re-education;Manual techniques;Scar mobilization;Taping;Dry needling;Passive range of motion;Joint Manipulations;Vasopneumatic Device;Canalith Repostioning;Vestibular    PT Next Visit Plan  continue shoulder rehab per POC, assess vestibular system PRN    PT Home Exercise Plan  [id number]   red band rows and shoulder extension    Consulted and Agree with Plan of Care  Patient       Patient will benefit from skilled therapeutic intervention in order to improve the following deficits and impairments:  Pain, Increased muscle spasms, Decreased scar mobility, Decreased mobility, Decreased activity tolerance, Decreased endurance, Decreased range of motion, Decreased strength, Impaired UE functional use, Impaired flexibility  Visit Diagnosis: Chronic left shoulder pain  Stiffness of left shoulder, not elsewhere classified     Problem List Patient Active Problem List   Diagnosis Date Noted  . Nausea 07/27/2018  . Clavicle fracture, shaft 07/27/2018  . Osteopenia 12/20/2015  . Allergy to cats 12/19/2015  . Asthma, mild intermittent 12/19/2015  . Hyperlipidemia 09/19/2014  . Hot flashes 09/19/2014    [name]  PT 01/24/2019, 7:44 AM  [facility] [address] [place], [place], [address] Phone: [phone]   Fax:  [phone]  Name: GERELDINE NUCCIO MRN: [id number] Date of Birth: 04-07-1955

## 2019-01-26 ENCOUNTER — Encounter: Payer: BC Managed Care – PPO | Admitting: Physical Therapy

## 2019-01-27 ENCOUNTER — Ambulatory Visit: Payer: BC Managed Care – PPO

## 2019-01-27 ENCOUNTER — Other Ambulatory Visit: Payer: Self-pay

## 2019-01-27 DIAGNOSIS — M25612 Stiffness of left shoulder, not elsewhere classified: Secondary | ICD-10-CM

## 2019-01-27 DIAGNOSIS — M25512 Pain in left shoulder: Secondary | ICD-10-CM

## 2019-01-27 DIAGNOSIS — G8929 Other chronic pain: Secondary | ICD-10-CM

## 2019-01-27 NOTE — Therapy (Signed)
[place], [place], [address] Phone: [phone]   Fax:  [phone]  Physical Therapy Treatment  Patient Details  Name: Megan Hooper MRN: [id number] Date of Birth: 1955/05/29 Referring Provider (PT): Corliss Parish, MD   Encounter Date: 01/27/2019  PT End of Session - 01/27/19 0704    Visit Number  7    Number of Visits  12    Date for PT Re-Evaluation  02/09/19    Authorization Type  BCBS    PT Start Time  0705    PT Stop Time  0745    PT Time Calculation (min)  40 min    Activity Tolerance  Patient tolerated treatment well    Behavior During Therapy  [facility] for tasks assessed/performed       Past Medical History:  Diagnosis Date  . Allergy   . Anemia   . Asthma   . Clavicle fracture    Left  . Dyslipidemia   . Leukopenia    mild  . Osteopenia   . Overactive bladder     Past Surgical History:  Procedure Laterality Date  . ABDOMINAL HYSTERECTOMY    . ORIF CLAVICULAR FRACTURE Left 07/27/2018   OPEN REDUCTION INTERNAL FIXATION (ORIF) LEFT CLAVICULAR FRACTURE AND ILIAC CREST BONE MARROW ASPIRATION FOR GRAFTING  . ORIF CLAVICULAR FRACTURE Left 07/27/2018   Procedure: OPEN REDUCTION INTERNAL FIXATION (ORIF) LEFT CLAVICULAR FRACTURE AND POSSIBLE BONE MARROW ASPIRATION FOR GRAFTING;  Surgeon: Meredith Pel, MD;  Location: [place];  Service: Orthopedics;  Laterality: Left;    There were no vitals filed for this visit.  Subjective Assessment - 01/27/19 0706    Subjective  No pain this AM    Currently in Pain?  No/denies                       [facility] Adult PT Treatment/Exercise - 01/27/19 0001      Shoulder Exercises: Prone   Flexion  Left;20 reps    Internal Rotation  20 reps;Left    Internal Rotation Limitations  reaching to buttoks    Horizontal ABduction 1  Left;20 reps      Shoulder Exercises: Sidelying   Other Sidelying Exercises  arm openers LT x 20      Shoulder Exercises: Pulleys   Other Pulley Exercises  5 min total. flex scaption abduction      Shoulder Exercises: Stretch   Corner Stretch  30 seconds;1 rep    Corner Stretch Limitations  LT flexion    Cross Chest Stretch  2 reps;30 seconds    External Rotation Stretch  1 rep;30 seconds   in doorway     Modalities   Modalities  Moist Heat      Moist Heat Therapy   Number Minutes Moist Heat  12 Minutes    Moist Heat Location  Shoulder      Manual Therapy   Joint Mobilization  Grade 2/3 GHJ AP and inf glide, scap mobs    Passive ROM  L GHJ, all motions                PT Short Term Goals - 01/18/19 0708      PT SHORT TERM GOAL #1   Title  Pt to be independent with initial HEP    Status  Achieved      PT SHORT TERM GOAL #2   Title  Pt to demo improved PROM for shoulder flex by  at least 10 degrees    Status  Achieved        PT Long Term Goals - 01/18/19 0709      PT LONG TERM GOAL #1   Title  Pt to be independent with final HEP    Status  On-going      PT LONG TERM GOAL #2   Title  Pt to demo improved PROM and AROM to be WNL for L shoulder, to improve ability for ADLs and IADLs.    Status  On-going      PT LONG TERM GOAL #3   Title  Pt to demo improved strength of L Shoulder to at least 4/5 for elevation, and 4+/5 for rotation,  to improve ability for IADLs.    Status  On-going      PT LONG TERM GOAL #4   Title  Pt to demo decreased pain in L shoulder to 0-2/10 with activity    Baseline  highest pain in past week 7/10 rolling in bed.    Status  On-going      PT LONG TERM GOAL #5   Title  report resolve of vertigo    Status  Achieved            Plan - 01/27/19 0704    Clinical Impression Statement  No dizziness in past week.   Generally no pain unless reaching to end range    PT Treatment/Interventions  ADLs/Self Care Home Management;Cryotherapy;Electrical Stimulation;DME Instruction;Ultrasound;Moist Heat;Iontophoresis 4mg /ml  Dexamethasone;Functional mobility training;Therapeutic activities;Therapeutic exercise;Patient/family education;Neuromuscular re-education;Manual techniques;Scar mobilization;Taping;Dry needling;Passive range of motion;Joint Manipulations;Vasopneumatic Device;Canalith Repostioning;Vestibular    PT Next Visit Plan  continue shoulder rehab per POC, assess vestibular system PRN    PT Home Exercise Plan  [email or web address]   red band rows and shoulder extension    Consulted and Agree with Plan of Care  Patient       Patient will benefit from skilled therapeutic intervention in order to improve the following deficits and impairments:  Pain, Increased muscle spasms, Decreased scar mobility, Decreased mobility, Decreased activity tolerance, Decreased endurance, Decreased range of motion, Decreased strength, Impaired UE functional use, Impaired flexibility  Visit Diagnosis: Chronic left shoulder pain  Stiffness of left shoulder, not elsewhere classified     Problem List Patient Active Problem List   Diagnosis Date Noted  . Nausea 07/27/2018  . Clavicle fracture, shaft 07/27/2018  . Osteopenia 12/20/2015  . Allergy to cats 12/19/2015  . Asthma, mild intermittent 12/19/2015  . Hyperlipidemia 09/19/2014  . Hot flashes 09/19/2014    [name]  PT 01/27/2019, 7:39 AM  [facility] [address] [place], [place], [address] Phone: [phone]   Fax:  [phone]  Name: Megan Hooper MRN: [id number] Date of Birth: Jul 27, 1955

## 2019-01-28 ENCOUNTER — Encounter: Payer: BC Managed Care – PPO | Admitting: Physical Therapy

## 2019-01-31 ENCOUNTER — Other Ambulatory Visit: Payer: Self-pay

## 2019-01-31 ENCOUNTER — Ambulatory Visit: Payer: BC Managed Care – PPO | Attending: Orthopedic Surgery

## 2019-01-31 DIAGNOSIS — M25612 Stiffness of left shoulder, not elsewhere classified: Secondary | ICD-10-CM | POA: Insufficient documentation

## 2019-01-31 DIAGNOSIS — M25512 Pain in left shoulder: Secondary | ICD-10-CM | POA: Insufficient documentation

## 2019-01-31 DIAGNOSIS — G8929 Other chronic pain: Secondary | ICD-10-CM | POA: Diagnosis not present

## 2019-01-31 NOTE — Patient Instructions (Addendum)
Posterior Capsule Sleeper Stretch, Side-Lying    Lie on side, pillow under head, neck in neutral, underside arm in 90-90 of shoulder and elbow flexion with scapula fixed to table. Use other hand to press back of underside arm forward and downward. Keep elbow angle. Hold _30_ seconds.  Repeat _3__ times per session. Do _2__ sessions per day. Advanced: Use PNF contract-relax method.  Copyright  VHI. All rights reserved.  SHOULDER: Flexion At Wall    Slide both arms up wall while leaning gently into wall. Maintain upright posture and tuck in stomach. Hold _30__ seconds. __3_ reps per set, _2__ sets per day, _7Copyright  VHI. All rights reserved.  Flexibility: Corner Stretch    Standing in corner with hands just above shoulder level and feet ____ inches from corner, lean forward until a comfortable stretch is felt across chest. Hold _30___ seconds. Repeat 3____ times per set. Do _1-2___ sets per session. Do _2___ sessions per day.  http://orth.exer.us/343   Copyright  VHI. All rights reserved.  CHEST: Doorway, Unilateral - Standing    Standing in doorway, place one hand on wall with elbow bent at shoulder height. Lean forward. Hold _30__ seconds. _3__ reps per set, __2_ sets per day, ___7s per week  Copyright  VHI. All rights reserved.

## 2019-01-31 NOTE — Therapy (Signed)
[place], [place], [address] Phone: [phone]   Fax:  [phone]  Physical Therapy Treatment  Patient Details  Name: Megan Hooper MRN: [id number] Date of Birth: [birth year] Referring Provider (PT): Corliss Parish, MD   Encounter Date: 01/31/2019  PT End of Session - 01/31/19 0703    Visit Number  8    Number of Visits  12    Date for PT Re-Evaluation  02/09/19    Authorization Type  BCBS    PT Start Time  0700    PT Stop Time  0740    PT Time Calculation (min)  40 min    Activity Tolerance  Patient tolerated treatment well    Behavior During Therapy  [facility] for tasks assessed/performed       Past Medical History:  Diagnosis Date  . Allergy   . Anemia   . Asthma   . Clavicle fracture    Left  . Dyslipidemia   . Leukopenia    mild  . Osteopenia   . Overactive bladder     Past Surgical History:  Procedure Laterality Date  . ABDOMINAL HYSTERECTOMY    . ORIF CLAVICULAR FRACTURE Left 07/27/2018   OPEN REDUCTION INTERNAL FIXATION (ORIF) LEFT CLAVICULAR FRACTURE AND ILIAC CREST BONE MARROW ASPIRATION FOR GRAFTING  . ORIF CLAVICULAR FRACTURE Left 07/27/2018   Procedure: OPEN REDUCTION INTERNAL FIXATION (ORIF) LEFT CLAVICULAR FRACTURE AND POSSIBLE BONE MARROW ASPIRATION FOR GRAFTING;  Surgeon: Meredith Pel, MD;  Location: [place];  Service: Orthopedics;  Laterality: Left;    There were no vitals filed for this visit.  Subjective Assessment - 01/31/19 0703    Subjective  no pain About the same .    Currently in Pain?  No/denies                       [facility] Adult PT Treatment/Exercise - 01/31/19 0001      Shoulder Exercises: Standing   Extension  Both;20 reps;Theraband    Theraband Level (Shoulder Extension)  Level 2 (Red)    Row  20 reps    Theraband Level (Shoulder Row)  Level 2 (Red)    Other Standing Exercises  Flexion and  abduction x 15 3# LT       Shoulder  Exercises: Pulleys   Other Pulley Exercises  5 min total. flex scaption abduction      Shoulder Exercises: ROM/Strengthening   UBE (Upper Arm Bike)  L2 3 min forward and 3 back      Shoulder Exercises: Stretch   Internal Rotation Stretch  3 reps    Internal Rotation Stretch Limitations  sleeper stretch 30 sec    Other Shoulder Stretches  wall stretch flexion      Manual Therapy   Joint Mobilization  Grade 2/3 GHJ AP and inf glide, scap mobs    Passive ROM  L GHJ, all motions              PT Education - 01/31/19 0734    Education Details  sleeper stretch    Person(s) Educated  Patient    Methods  Explanation;Tactile cues;Verbal cues;Handout    Comprehension  Returned demonstration;Verbalized understanding       PT Short Term Goals - 01/18/19 0708      PT SHORT TERM GOAL #1   Title  Pt to be independent with initial HEP    Status  Achieved      PT  SHORT TERM GOAL #2   Title  Pt to demo improved PROM for shoulder flex by at least 10 degrees    Status  Achieved        PT Long Term Goals - 01/18/19 0709      PT LONG TERM GOAL #1   Title  Pt to be independent with final HEP    Status  On-going      PT LONG TERM GOAL #2   Title  Pt to demo improved PROM and AROM to be WNL for L shoulder, to improve ability for ADLs and IADLs.    Status  On-going      PT LONG TERM GOAL #3   Title  Pt to demo improved strength of L Shoulder to at least 4/5 for elevation, and 4+/5 for rotation,  to improve ability for IADLs.    Status  On-going      PT LONG TERM GOAL #4   Title  Pt to demo decreased pain in L shoulder to 0-2/10 with activity    Baseline  highest pain in past week 7/10 rolling in bed.    Status  On-going      PT LONG TERM GOAL #5   Title  report resolve of vertigo    Status  Achieved            Plan - 01/31/19 0703    Clinical Impression Statement  Did not measure. Continued active and manual  ROM and strength in available ROM    PT  Treatment/Interventions  ADLs/Self Care Home Management;Cryotherapy;Electrical Stimulation;DME Instruction;Ultrasound;Moist Heat;Iontophoresis 4mg /ml Dexamethasone;Functional mobility training;Therapeutic activities;Therapeutic exercise;Patient/family education;Neuromuscular re-education;Manual techniques;Scar mobilization;Taping;Dry needling;Passive range of motion;Joint Manipulations;Vasopneumatic Device;Canalith Repostioning;Vestibular    PT Next Visit Plan  continue shoulder rehab per POC, assess vestibular system PRN    PT Home Exercise Plan  [email or web address]   red band rows and shoulder extension  corner ER stretch , wall stretch into flexion,    sleeper stretch    Consulted and Agree with Plan of Care  Patient       Patient will benefit from skilled therapeutic intervention in order to improve the following deficits and impairments:  Pain, Increased muscle spasms, Decreased scar mobility, Decreased mobility, Decreased activity tolerance, Decreased endurance, Decreased range of motion, Decreased strength, Impaired UE functional use, Impaired flexibility  Visit Diagnosis: Chronic left shoulder pain  Stiffness of left shoulder, not elsewhere classified     Problem List Patient Active Problem List   Diagnosis Date Noted  . Nausea 07/27/2018  . Clavicle fracture, shaft 07/27/2018  . Osteopenia 12/20/2015  . Allergy to cats 12/19/2015  . Asthma, mild intermittent 12/19/2015  . Hyperlipidemia 09/19/2014  . Hot flashes 09/19/2014    [name]  PT 01/31/2019, 7:48 AM  [facility] [address] [place], [place], [address] Phone: [phone]   Fax:  [phone]  Name: Megan Hooper MRN: [id number] Date of Birth: [birth year]

## 2019-02-02 ENCOUNTER — Ambulatory Visit: Payer: BC Managed Care – PPO | Admitting: Orthopedic Surgery

## 2019-02-03 ENCOUNTER — Other Ambulatory Visit: Payer: Self-pay

## 2019-02-03 ENCOUNTER — Ambulatory Visit: Payer: BC Managed Care – PPO

## 2019-02-03 DIAGNOSIS — M25612 Stiffness of left shoulder, not elsewhere classified: Secondary | ICD-10-CM

## 2019-02-03 DIAGNOSIS — M25512 Pain in left shoulder: Secondary | ICD-10-CM

## 2019-02-03 DIAGNOSIS — G8929 Other chronic pain: Secondary | ICD-10-CM

## 2019-02-03 NOTE — Therapy (Signed)
[place], [place], [address] Phone: [phone]   Fax:  [phone]  Physical Therapy Treatment  Patient Details  Name: Megan Hooper MRN: [id number] Date of Birth: Aug 12, 1955 Referring Provider (PT): Corliss Parish, MD   Encounter Date: 02/03/2019  PT End of Session - 02/03/19 0708    Visit Number  9    Number of Visits  12    Date for PT Re-Evaluation  02/09/19    Authorization Type  BCBS    PT Start Time  0705    PT Stop Time  0745    PT Time Calculation (min)  40 min    Activity Tolerance  Patient tolerated treatment well    Behavior During Therapy  [facility] for tasks assessed/performed       Past Medical History:  Diagnosis Date  . Allergy   . Anemia   . Asthma   . Clavicle fracture    Left  . Dyslipidemia   . Leukopenia    mild  . Osteopenia   . Overactive bladder     Past Surgical History:  Procedure Laterality Date  . ABDOMINAL HYSTERECTOMY    . ORIF CLAVICULAR FRACTURE Left 07/27/2018   OPEN REDUCTION INTERNAL FIXATION (ORIF) LEFT CLAVICULAR FRACTURE AND ILIAC CREST BONE MARROW ASPIRATION FOR GRAFTING  . ORIF CLAVICULAR FRACTURE Left 07/27/2018   Procedure: OPEN REDUCTION INTERNAL FIXATION (ORIF) LEFT CLAVICULAR FRACTURE AND POSSIBLE BONE MARROW ASPIRATION FOR GRAFTING;  Surgeon: Meredith Pel, MD;  Location: [facility];  Service: Orthopedics;  Laterality: Left;    There were no vitals filed for this visit.  Subjective Assessment - 02/03/19 0708    Subjective  No complaints . Still stiff and end range pain.    Patient Stated Goals  Decreased pain, improved movment.; improve dizziness    Currently in Pain?  No/denies         [facility] PT Assessment - 02/03/19 0001      AROM   Left Shoulder Flexion  120 Degrees   sitting   Left Shoulder ABduction  120 Degrees   sitting   Left Shoulder Internal Rotation  50 Degrees   shoulder abducted 90 degrees   Left Shoulder External  Rotation  35 Degrees      PROM   Left Shoulder Flexion  140 Degrees   supine                  [facility] Adult PT Treatment/Exercise - 02/03/19 0001      Shoulder Exercises: Standing   Other Standing Exercises  back to wall angels x 12       Shoulder Exercises: Pulleys   Other Pulley Exercises  4 min flex/scaption/abduction      Shoulder Exercises: ROM/Strengthening   UBE (Upper Arm Bike)  L2 3 min forward and 3 back      Shoulder Exercises: Stretch   Cross Chest Stretch  2 reps;30 seconds    Internal Rotation Stretch Limitations  sleeper stretch 30 sec    Other Shoulder Stretches  wall stretch flexion      Manual Therapy   Joint Mobilization  Grade /3/4 GHJ AP and inf glide, scap mobs    Soft tissue mobilization  sub scap release    Passive ROM  L GHJ, all motions                PT Short Term Goals - 01/18/19 0708      PT SHORT TERM GOAL #  1   Title  Pt to be independent with initial HEP    Status  Achieved      PT SHORT TERM GOAL #2   Title  Pt to demo improved PROM for shoulder flex by at least 10 degrees    Status  Achieved        PT Long Term Goals - 01/18/19 0709      PT LONG TERM GOAL #1   Title  Pt to be independent with final HEP    Status  On-going      PT LONG TERM GOAL #2   Title  Pt to demo improved PROM and AROM to be WNL for L shoulder, to improve ability for ADLs and IADLs.    Status  On-going      PT LONG TERM GOAL #3   Title  Pt to demo improved strength of L Shoulder to at least 4/5 for elevation, and 4+/5 for rotation,  to improve ability for IADLs.    Status  On-going      PT LONG TERM GOAL #4   Title  Pt to demo decreased pain in L shoulder to 0-2/10 with activity    Baseline  highest pain in past week 7/10 rolling in bed.    Status  On-going      PT LONG TERM GOAL #5   Title  report resolve of vertigo    Status  Achieved            Plan - 02/03/19 0707    Clinical Impression Statement  Some ROM has improved  but still stiff and tender along lateral border of scapula. Overall she is improving. Discussed MUA as option though she does not do well with anesthesia    Examination-Participation Restrictions  Cleaning;Meal Prep;Yard Work;Community Activity;Driving;Laundry;Shop    PT Treatment/Interventions  ADLs/Self Care Home Management;Cryotherapy;Electrical Stimulation;DME Instruction;Ultrasound;Moist Heat;Iontophoresis 4mg /ml Dexamethasone;Functional mobility training;Therapeutic activities;Therapeutic exercise;Patient/family education;Neuromuscular re-education;Manual techniques;Scar mobilization;Taping;Dry needling;Passive range of motion;Joint Manipulations;Vasopneumatic Device;Canalith Repostioning;Vestibular    PT Next Visit Plan  continue shoulder rehab per POC, assess vestibular system PRN    PT Home Exercise Plan  [id number]   red band rows and shoulder extension  corner ER stretch , wall stretch into flexion,    sleeper stretch    Consulted and Agree with Plan of Care  Patient       Patient will benefit from skilled therapeutic intervention in order to improve the following deficits and impairments:  Pain, Increased muscle spasms, Decreased scar mobility, Decreased mobility, Decreased activity tolerance, Decreased endurance, Decreased range of motion, Decreased strength, Impaired UE functional use, Impaired flexibility  Visit Diagnosis: Stiffness of left shoulder, not elsewhere classified  Chronic left shoulder pain     Problem List Patient Active Problem List   Diagnosis Date Noted  . Nausea 07/27/2018  . Clavicle fracture, shaft 07/27/2018  . Osteopenia 12/20/2015  . Allergy to cats 12/19/2015  . Asthma, mild intermittent 12/19/2015  . Hyperlipidemia 09/19/2014  . Hot flashes 09/19/2014    [name]  PT 02/03/2019, 7:53 AM  [facility] [address] [place], [place], [address] Phone: [phone]   Fax:   [phone]  Name: Megan Hooper MRN: [id number] Date of Birth: January 04, 1956

## 2019-02-07 ENCOUNTER — Ambulatory Visit: Payer: BC Managed Care – PPO

## 2019-02-07 ENCOUNTER — Other Ambulatory Visit: Payer: Self-pay

## 2019-02-07 DIAGNOSIS — G8929 Other chronic pain: Secondary | ICD-10-CM | POA: Diagnosis not present

## 2019-02-07 DIAGNOSIS — M25512 Pain in left shoulder: Secondary | ICD-10-CM | POA: Diagnosis not present

## 2019-02-07 DIAGNOSIS — M25612 Stiffness of left shoulder, not elsewhere classified: Secondary | ICD-10-CM

## 2019-02-07 NOTE — Therapy (Signed)
[place], [place], [address] Phone: [phone]   Fax:  [phone]  Physical Therapy Treatment  Patient Details  Name: Megan Hooper MRN: [id number] Date of Birth: [birth year] Referring Provider (PT): Corliss Parish, MD   Encounter Date: 02/07/2019  PT End of Session - 02/07/19 0705    Visit Number  10    Number of Visits  12    Date for PT Re-Evaluation  03/25/19    Authorization Type  BCBS    PT Start Time  0702    PT Stop Time  0743    PT Time Calculation (min)  41 min    Activity Tolerance  Patient tolerated treatment well    Behavior During Therapy  [facility] for tasks assessed/performed       Past Medical History:  Diagnosis Date  . Allergy   . Anemia   . Asthma   . Clavicle fracture    Left  . Dyslipidemia   . Leukopenia    mild  . Osteopenia   . Overactive bladder     Past Surgical History:  Procedure Laterality Date  . ABDOMINAL HYSTERECTOMY    . ORIF CLAVICULAR FRACTURE Left 07/27/2018   OPEN REDUCTION INTERNAL FIXATION (ORIF) LEFT CLAVICULAR FRACTURE AND ILIAC CREST BONE MARROW ASPIRATION FOR GRAFTING  . ORIF CLAVICULAR FRACTURE Left 07/27/2018   Procedure: OPEN REDUCTION INTERNAL FIXATION (ORIF) LEFT CLAVICULAR FRACTURE AND POSSIBLE BONE MARROW ASPIRATION FOR GRAFTING;  Surgeon: Meredith Pel, MD;  Location: [facility];  Service: Orthopedics;  Laterality: Left;    There were no vitals filed for this visit.  Subjective Assessment - 02/07/19 0706    Subjective  No pain . Improved use but stiffness limts.    Currently in Pain?  No/denies         [facility] PT Assessment - 02/07/19 0001      AROM   Left Shoulder Flexion  120 Degrees    Left Shoulder ABduction  120 Degrees    Left Shoulder Internal Rotation  50 Degrees    Left Shoulder External Rotation  35 Degrees      PROM   Left Shoulder Flexion  140 Degrees                   [facility] Adult PT Treatment/Exercise  - 02/07/19 0001      Shoulder Exercises: Sidelying   External Rotation  Left;15 reps    External Rotation Weight (lbs)  3      Shoulder Exercises: Standing   Other Standing Exercises  Flexion and  abduction and over head  x 15   4#   LT          Carry 15# 100 feet x 2     Other Standing Exercises  back to wall angels x 12              flexion stretch at wall x 10 with lift off wall at end ronge      Shoulder Exercises: ROM/Strengthening   UBE (Upper Arm Bike)  L2 3 min forward and 3 back      Manual Therapy   Joint Mobilization  Grade /3/4 GHJ AP and inf glide, scap mobs    Soft tissue mobilization  sub scap release    Passive ROM  L GHJ, all motions                PT Short Term Goals - 01/18/19 [id number]  PT SHORT TERM GOAL #1   Title  Pt to be independent with initial HEP    Status  Achieved      PT SHORT TERM GOAL #2   Title  Pt to demo improved PROM for shoulder flex by at least 10 degrees    Status  Achieved        PT Long Term Goals - 01/18/19 0709      PT LONG TERM GOAL #1   Title  Pt to be independent with final HEP    Status  On-going      PT LONG TERM GOAL #2   Title  Pt to demo improved PROM and AROM to be WNL for L shoulder, to improve ability for ADLs and IADLs.    Status  On-going      PT LONG TERM GOAL #3   Title  Pt to demo improved strength of L Shoulder to at least 4/5 for elevation, and 4+/5 for rotation,  to improve ability for IADLs.    Status  On-going      PT LONG TERM GOAL #4   Title  Pt to demo decreased pain in L shoulder to 0-2/10 with activity    Baseline  highest pain in past week 7/10 rolling in bed.    Status  On-going      PT LONG TERM GOAL #5   Title  report resolve of vertigo    Status  Achieved            Plan - 02/07/19 0706    Clinical Impression Statement  She loosens during sesison but returns stiff. ER tends to be stiffeer and more painful.  She would benefit from more PT sessions    Personal Factors and  Comorbidities  Profession    PT Treatment/Interventions  ADLs/Self Care Home Management;Cryotherapy;Electrical Stimulation;DME Instruction;Ultrasound;Moist Heat;Iontophoresis 4mg /ml Dexamethasone;Functional mobility training;Therapeutic activities;Therapeutic exercise;Patient/family education;Neuromuscular re-education;Manual techniques;Scar mobilization;Taping;Dry needling;Passive range of motion;Joint Manipulations;Vasopneumatic Device;Canalith Repostioning;Vestibular    PT Next Visit Plan  continue shoulder rehab per POC, assess vestibular system PRN    PT Home Exercise Plan  [id number]   red band rows and shoulder extension  corner ER stretch , wall stretch into flexion,    sleeper stretch    Consulted and Agree with Plan of Care  Patient       Patient will benefit from skilled therapeutic intervention in order to improve the following deficits and impairments:  Pain, Increased muscle spasms, Decreased scar mobility, Decreased mobility, Decreased activity tolerance, Decreased endurance, Decreased range of motion, Decreased strength, Impaired UE functional use, Impaired flexibility  Visit Diagnosis: Stiffness of left shoulder, not elsewhere classified  Chronic left shoulder pain     Problem List Patient Active Problem List   Diagnosis Date Noted  . Nausea 07/27/2018  . Clavicle fracture, shaft 07/27/2018  . Osteopenia 12/20/2015  . Allergy to cats 12/19/2015  . Asthma, mild intermittent 12/19/2015  . Hyperlipidemia 09/19/2014  . Hot flashes 09/19/2014    [name] PT 02/07/2019, 7:53 AM  [facility] [address] [place], [place], [address] Phone: [phone]   Fax:  [phone]  Name: Megan Hooper MRN: [id number] Date of Birth: [birth year]

## 2019-02-10 ENCOUNTER — Other Ambulatory Visit: Payer: Self-pay

## 2019-02-10 ENCOUNTER — Ambulatory Visit: Payer: BC Managed Care – PPO

## 2019-02-10 DIAGNOSIS — M25612 Stiffness of left shoulder, not elsewhere classified: Secondary | ICD-10-CM

## 2019-02-10 DIAGNOSIS — G8929 Other chronic pain: Secondary | ICD-10-CM | POA: Diagnosis not present

## 2019-02-10 DIAGNOSIS — M25512 Pain in left shoulder: Secondary | ICD-10-CM

## 2019-02-10 NOTE — Therapy (Signed)
[place], [place], [address] Phone: [phone]   Fax:  [phone]  Physical Therapy Treatment  Patient Details  Name: Megan Hooper MRN: [id number] Date of Birth: 21-Jun-1955 Referring Provider (PT): Corliss Parish, MD   Encounter Date: 02/10/2019  PT End of Session - 02/10/19 0710    Visit Number  11    Number of Visits  22    Date for PT Re-Evaluation  03/25/19    Authorization Type  BCBS    PT Start Time  0704    PT Stop Time  0744    PT Time Calculation (min)  40 min    Activity Tolerance  Patient tolerated treatment well    Behavior During Therapy  [facility] for tasks assessed/performed       Past Medical History:  Diagnosis Date  . Allergy   . Anemia   . Asthma   . Clavicle fracture    Left  . Dyslipidemia   . Leukopenia    mild  . Osteopenia   . Overactive bladder     Past Surgical History:  Procedure Laterality Date  . ABDOMINAL HYSTERECTOMY    . ORIF CLAVICULAR FRACTURE Left 07/27/2018   OPEN REDUCTION INTERNAL FIXATION (ORIF) LEFT CLAVICULAR FRACTURE AND ILIAC CREST BONE MARROW ASPIRATION FOR GRAFTING  . ORIF CLAVICULAR FRACTURE Left 07/27/2018   Procedure: OPEN REDUCTION INTERNAL FIXATION (ORIF) LEFT CLAVICULAR FRACTURE AND POSSIBLE BONE MARROW ASPIRATION FOR GRAFTING;  Surgeon: Meredith Pel, MD;  Location: [facility];  Service: Orthopedics;  Laterality: Left;    There were no vitals filed for this visit.      [facility] PT Assessment - 02/10/19 0001      PROM   Left Shoulder Flexion  148 Degrees    Left Shoulder External Rotation  70 Degrees      Strength   Left Shoulder Flexion  4/5    Left Shoulder ABduction  4/5    Left Shoulder Internal Rotation  4+/5    Left Shoulder External Rotation  4/5    Left Shoulder Horizontal ABduction  4/5                   [facility] Adult PT Treatment/Exercise - 02/10/19 0001      Shoulder Exercises: Supine   Protraction  Weight (lbs)  6    Protraction Limitations  bench press LT x 20    Other Supine Exercises  overhead stretch with dowel      Shoulder Exercises: Sidelying   External Rotation  Left;20 reps    External Rotation Weight (lbs)  3      Shoulder Exercises: Standing   Other Standing Exercises  Flexion and  abduction and over head  and hor abduction  x 20   3#   LT          Carry 15# 100 feet x 2     Other Standing Exercises             flexion stretch at wall x 10 with lift off wall at end ronge      Shoulder Exercises: ROM/Strengthening   UBE (Upper Arm Bike)  L2 3 min forward and 3 back      Shoulder Exercises: Stretch   Cross Chest Stretch  2 reps;30 seconds    Other Shoulder Stretches  wall stretch flexion    Other Shoulder Stretches  Sleeper stretch x 3 20 sec      Manual Therapy  Joint Mobilization  Grade /3/4 GHJ AP and inf glide, scap mobs    Passive ROM  L GHJ, all motions                PT Short Term Goals - 01/18/19 0708      PT SHORT TERM GOAL #1   Title  Pt to be independent with initial HEP    Status  Achieved      PT SHORT TERM GOAL #2   Title  Pt to demo improved PROM for shoulder flex by at least 10 degrees    Status  Achieved        PT Long Term Goals - 01/18/19 0709      PT LONG TERM GOAL #1   Title  Pt to be independent with final HEP    Status  On-going      PT LONG TERM GOAL #2   Title  Pt to demo improved PROM and AROM to be WNL for L shoulder, to improve ability for ADLs and IADLs.    Status  On-going      PT LONG TERM GOAL #3   Title  Pt to demo improved strength of L Shoulder to at least 4/5 for elevation, and 4+/5 for rotation,  to improve ability for IADLs.    Status  On-going      PT LONG TERM GOAL #4   Title  Pt to demo decreased pain in L shoulder to 0-2/10 with activity    Baseline  highest pain in past week 7/10 rolling in bed.    Status  On-going      PT LONG TERM GOAL #5   Title  report resolve of vertigo    Status   Achieved            Plan - 02/10/19 0710    Clinical Impression Statement  Passive ROM appears improved . Tolerates stretching well. Continued weakness but improved since last measured    PT Frequency  2x / week    PT Duration  6 weeks    PT Treatment/Interventions  ADLs/Self Care Home Management;Cryotherapy;Electrical Stimulation;DME Instruction;Ultrasound;Moist Heat;Iontophoresis 4mg /ml Dexamethasone;Functional mobility training;Therapeutic activities;Therapeutic exercise;Patient/family education;Neuromuscular re-education;Manual techniques;Scar mobilization;Taping;Dry needling;Passive range of motion;Joint Manipulations;Vasopneumatic Device;Canalith Repostioning;Vestibular    PT Next Visit Plan  continue shoulder rehab per POC  strength and ROM , assess vestibular system PRN    PT Home Exercise Plan  [email or web address]   red band rows and shoulder extension  corner ER stretch , wall stretch into flexion,    sleeper stretch    Consulted and Agree with Plan of Care  Patient       Patient will benefit from skilled therapeutic intervention in order to improve the following deficits and impairments:  Pain, Increased muscle spasms, Decreased scar mobility, Decreased mobility, Decreased activity tolerance, Decreased endurance, Decreased range of motion, Decreased strength, Impaired UE functional use, Impaired flexibility  Visit Diagnosis: Stiffness of left shoulder, not elsewhere classified - Plan: PT plan of care cert/re-cert  Chronic left shoulder pain - Plan: PT plan of care cert/re-cert     Problem List Patient Active Problem List   Diagnosis Date Noted  . Nausea 07/27/2018  . Clavicle fracture, shaft 07/27/2018  . Osteopenia 12/20/2015  . Allergy to cats 12/19/2015  . Asthma, mild intermittent 12/19/2015  . Hyperlipidemia 09/19/2014  . Hot flashes 09/19/2014    [name]  PT 02/10/2019, 7:51 AM  [place]  [place], [place], [address] Phone: [phone]   Fax:  [phone]  Name: Megan Hooper MRN: [id number] Date of Birth: [birth year]

## 2019-02-14 ENCOUNTER — Telehealth: Payer: Self-pay | Admitting: Family Medicine

## 2019-02-14 NOTE — Telephone Encounter (Signed)
Received a fax from [facility] stating that they have been trying to reach the patient to schedule a follow up as recommended by her radiologist. I faxed over the paper as well.

## 2019-02-15 ENCOUNTER — Other Ambulatory Visit: Payer: Self-pay

## 2019-02-15 ENCOUNTER — Ambulatory Visit: Payer: BC Managed Care – PPO

## 2019-02-15 DIAGNOSIS — G8929 Other chronic pain: Secondary | ICD-10-CM

## 2019-02-15 DIAGNOSIS — M25512 Pain in left shoulder: Secondary | ICD-10-CM | POA: Diagnosis not present

## 2019-02-15 DIAGNOSIS — M25612 Stiffness of left shoulder, not elsewhere classified: Secondary | ICD-10-CM | POA: Diagnosis not present

## 2019-02-15 NOTE — Therapy (Signed)
[place], [place], [address] Phone: [phone]   Fax:  [phone]  Physical Therapy Treatment  Patient Details  Name: Megan Hooper MRN: [id number] Date of Birth: 1956-03-10 Referring Provider (PT): Corliss Parish, MD   Encounter Date: 02/15/2019  PT End of Session - 02/15/19 0708    Visit Number  12    Number of Visits  22    Date for PT Re-Evaluation  03/25/19    Authorization Type  BCBS    PT Start Time  0703    PT Stop Time  0744    PT Time Calculation (min)  41 min    Activity Tolerance  Patient tolerated treatment well    Behavior During Therapy  [facility] for tasks assessed/performed       Past Medical History:  Diagnosis Date  . Allergy   . Anemia   . Asthma   . Clavicle fracture    Left  . Dyslipidemia   . Leukopenia    mild  . Osteopenia   . Overactive bladder     Past Surgical History:  Procedure Laterality Date  . ABDOMINAL HYSTERECTOMY    . ORIF CLAVICULAR FRACTURE Left 07/27/2018   OPEN REDUCTION INTERNAL FIXATION (ORIF) LEFT CLAVICULAR FRACTURE AND ILIAC CREST BONE MARROW ASPIRATION FOR GRAFTING  . ORIF CLAVICULAR FRACTURE Left 07/27/2018   Procedure: OPEN REDUCTION INTERNAL FIXATION (ORIF) LEFT CLAVICULAR FRACTURE AND POSSIBLE BONE MARROW ASPIRATION FOR GRAFTING;  Surgeon: Meredith Pel, MD;  Location: [facility];  Service: Orthopedics;  Laterality: Left;    There were no vitals filed for this visit.      [facility] PT Assessment - 02/15/19 0001      AROM   Left Shoulder Flexion  130 Degrees                   [facility] Adult PT Treatment/Exercise - 02/15/19 0001      Shoulder Exercises: Sidelying   External Rotation  Left    External Rotation Weight (lbs)  3    External Rotation Limitations  25 reps (5x5)    ABduction  Left    ABduction Limitations  3# x5  2# 3x5      Shoulder Exercises: Standing   Other Standing Exercises  Flexion and  abduction and  over head  and hor abduction 4 x 5   5#   LT          Carry 15# 100 feet x 2     Other Standing Exercises             flexion stretch at wall x 10 with lift off wall at end ronge      Shoulder Exercises: ROM/Strengthening   UBE (Upper Arm Bike)  L3 3 min forward and 3 back      Shoulder Exercises: Stretch   Other Shoulder Stretches  wall stretch flexion      Manual Therapy   Joint Mobilization  Grade /3/4 GHJ AP and inf glide, scap mobs    Passive ROM  L GHJ, all motions                PT Short Term Goals - 01/18/19 0708      PT SHORT TERM GOAL #1   Title  Pt to be independent with initial HEP    Status  Achieved      PT SHORT TERM GOAL #2   Title  Pt to demo improved PROM  for shoulder flex by at least 10 degrees    Status  Achieved        PT Long Term Goals - 01/18/19 0709      PT LONG TERM GOAL #1   Title  Pt to be independent with final HEP    Status  On-going      PT LONG TERM GOAL #2   Title  Pt to demo improved PROM and AROM to be WNL for L shoulder, to improve ability for ADLs and IADLs.    Status  On-going      PT LONG TERM GOAL #3   Title  Pt to demo improved strength of L Shoulder to at least 4/5 for elevation, and 4+/5 for rotation,  to improve ability for IADLs.    Status  On-going      PT LONG TERM GOAL #4   Title  Pt to demo decreased pain in L shoulder to 0-2/10 with activity    Baseline  highest pain in past week 7/10 rolling in bed.    Status  On-going      PT LONG TERM GOAL #5   Title  report resolve of vertigo    Status  Achieved            Plan - 02/15/19 0708    Clinical Impression Statement  Continue to push ROM and strength , no significant changes    PT Treatment/Interventions  ADLs/Self Care Home Management;Cryotherapy;Electrical Stimulation;DME Instruction;Ultrasound;Moist Heat;Iontophoresis 4mg /ml Dexamethasone;Functional mobility training;Therapeutic activities;Therapeutic exercise;Patient/family education;Neuromuscular  re-education;Manual techniques;Scar mobilization;Taping;Dry needling;Passive range of motion;Joint Manipulations;Vasopneumatic Device;Canalith Repostioning;Vestibular    PT Next Visit Plan  continue shoulder rehab per POC  strength and ROM , assess vestibular system PRN    PT Home Exercise Plan  [id number]   red band rows and shoulder extension  corner ER stretch , wall stretch into flexion,    sleeper stretch    Consulted and Agree with Plan of Care  Patient       Patient will benefit from skilled therapeutic intervention in order to improve the following deficits and impairments:  Pain, Increased muscle spasms, Decreased scar mobility, Decreased mobility, Decreased activity tolerance, Decreased endurance, Decreased range of motion, Decreased strength, Impaired UE functional use, Impaired flexibility  Visit Diagnosis: Stiffness of left shoulder, not elsewhere classified  Chronic left shoulder pain     Problem List Patient Active Problem List   Diagnosis Date Noted  . Nausea 07/27/2018  . Clavicle fracture, shaft 07/27/2018  . Osteopenia 12/20/2015  . Allergy to cats 12/19/2015  . Asthma, mild intermittent 12/19/2015  . Hyperlipidemia 09/19/2014  . Hot flashes 09/19/2014    [name]   PT 02/15/2019, 7:48 AM  [facility] [address] [place], [place], [address] Phone: [phone]   Fax:  [phone]  Name: Megan Hooper MRN: [id number] Date of Birth: 15-Oct-1955

## 2019-02-18 ENCOUNTER — Other Ambulatory Visit: Payer: Self-pay

## 2019-02-18 ENCOUNTER — Encounter: Payer: Self-pay | Admitting: Physical Therapy

## 2019-02-18 ENCOUNTER — Ambulatory Visit: Payer: BC Managed Care – PPO | Admitting: Physical Therapy

## 2019-02-18 DIAGNOSIS — M25512 Pain in left shoulder: Secondary | ICD-10-CM

## 2019-02-18 DIAGNOSIS — G8929 Other chronic pain: Secondary | ICD-10-CM

## 2019-02-18 DIAGNOSIS — M25612 Stiffness of left shoulder, not elsewhere classified: Secondary | ICD-10-CM | POA: Diagnosis not present

## 2019-02-18 NOTE — Therapy (Signed)
[place], [place], [address] Phone: [phone]   Fax:  [phone]  Physical Therapy Treatment  Patient Details  Name: Megan Hooper MRN: [id number] Date of Birth: [birth year] Referring Provider (PT): Corliss Parish, MD   Encounter Date: 02/18/2019  PT End of Session - 02/18/19 0743    Visit Number  13    Number of Visits  22    Date for PT Re-Evaluation  03/25/19    Authorization Type  BCBS    PT Start Time  0720    PT Stop Time  0758    PT Time Calculation (min)  38 min       Past Medical History:  Diagnosis Date  . Allergy   . Anemia   . Asthma   . Clavicle fracture    Left  . Dyslipidemia   . Leukopenia    mild  . Osteopenia   . Overactive bladder     Past Surgical History:  Procedure Laterality Date  . ABDOMINAL HYSTERECTOMY    . ORIF CLAVICULAR FRACTURE Left 07/27/2018   OPEN REDUCTION INTERNAL FIXATION (ORIF) LEFT CLAVICULAR FRACTURE AND ILIAC CREST BONE MARROW ASPIRATION FOR GRAFTING  . ORIF CLAVICULAR FRACTURE Left 07/27/2018   Procedure: OPEN REDUCTION INTERNAL FIXATION (ORIF) LEFT CLAVICULAR FRACTURE AND POSSIBLE BONE MARROW ASPIRATION FOR GRAFTING;  Surgeon: Meredith Pel, MD;  Location: [facility];  Service: Orthopedics;  Laterality: Left;    There were no vitals filed for this visit.  Subjective Assessment - 02/18/19 0723    Subjective  No pain now. Pain is sporadics, sometimes sore.    Currently in Pain?  No/denies         [facility] PT Assessment - 02/18/19 0001      AROM   Left Shoulder Flexion  122 Degrees    Left Shoulder ABduction  132 Degrees   scaption   Left Shoulder Internal Rotation  --   reaches T12   Left Shoulder External Rotation  --   Reaches T2                  [facility] Adult PT Treatment/Exercise - 02/18/19 0001      Shoulder Exercises: Supine   Flexion Limitations  3# L chest press     Other Supine Exercises  Lat pull over 3#  single arm       Shoulder Exercises: Sidelying   External Rotation  Left;10 reps   2 sets   External Rotation Weight (lbs)  3    ABduction  Left;10 reps   2 sets   ABduction Weight (lbs)  3      Shoulder Exercises: Standing   External Rotation  15 reps    Theraband Level (Shoulder External Rotation)  Level 3 (Green)    External Rotation Limitations  needs cues to control the motion    Internal Rotation  15 reps    Theraband Level (Shoulder Internal Rotation)  Level 3 (Green)    Internal Rotation Limitations  needs cues for control     Extension  Both;20 reps;Theraband    Theraband Level (Shoulder Extension)  Level 3 (Green)    Extension Limitations  cues for control of motion    Row  20 reps    Theraband Level (Shoulder Row)  Level 3 (Green)    Row Limitations  needs cues to slow down and control the motion    Other Standing Exercises  cabinet reaching 2 # middle shelf x  15 flex , x 15 scaption , wall washing circles 30 sec each way  , wall push up     Other Standing Exercises             flexion stretch at wall x 10 with lift off wall at end ronge      Shoulder Exercises: ROM/Strengthening   UBE (Upper Arm Bike)  L3 3 min forward and 3 back    Lat Pull  15 reps    Lat Pull Limitations  15#    Cybex Row  15 reps    Cybex Row Limitations  20#      Shoulder Exercises: IT sales professional Limitations  doorway stretch       Manual Therapy   Joint Mobilization  Grade 3 A/P and inferior  glaides     Passive ROM  L GHJ, all motions                PT Short Term Goals - 01/18/19 0708      PT SHORT TERM GOAL #1   Title  Pt to be independent with initial HEP    Status  Achieved      PT SHORT TERM GOAL #2   Title  Pt to demo improved PROM for shoulder flex by at least 10 degrees    Status  Achieved        PT Long Term Goals - 01/18/19 0709      PT LONG TERM GOAL #1   Title  Pt to be independent with final HEP    Status  On-going      PT LONG TERM GOAL #2    Title  Pt to demo improved PROM and AROM to be WNL for L shoulder, to improve ability for ADLs and IADLs.    Status  On-going      PT LONG TERM GOAL #3   Title  Pt to demo improved strength of L Shoulder to at least 4/5 for elevation, and 4+/5 for rotation,  to improve ability for IADLs.    Status  On-going      PT LONG TERM GOAL #4   Title  Pt to demo decreased pain in L shoulder to 0-2/10 with activity    Baseline  highest pain in past week 7/10 rolling in bed.    Status  On-going      PT LONG TERM GOAL #5   Title  report resolve of vertigo    Status  Achieved            Plan - 02/18/19 0724    Clinical Impression Statement  Pt reports difficulty closing back hatch of car and getting clothes out of dryer, getting things out of the high cabinets is difficult. Incorportated cabinet reaching and Lat pull down into treatment today. She did well with fatigue and some mild increase in pain.    PT Next Visit Plan  continue shoulder rehab per POC  strength and ROM , assess vestibular system PRN    PT Home Exercise Plan  [id number]   red band rows and shoulder extension  corner ER stretch , wall stretch into flexion,    sleeper stretch       Patient will benefit from skilled therapeutic intervention in order to improve the following deficits and impairments:  Pain, Increased muscle spasms, Decreased scar mobility, Decreased mobility, Decreased activity tolerance, Decreased endurance, Decreased range of motion, Decreased strength, Impaired UE functional use, Impaired flexibility  Visit  Diagnosis: Stiffness of left shoulder, not elsewhere classified  Chronic left shoulder pain     Problem List Patient Active Problem List   Diagnosis Date Noted  . Nausea 07/27/2018  . Clavicle fracture, shaft 07/27/2018  . Osteopenia 12/20/2015  . Allergy to cats 12/19/2015  . Asthma, mild intermittent 12/19/2015  . Hyperlipidemia 09/19/2014  . Hot flashes 09/19/2014    Dorene Ar, PTA 02/18/2019, 7:58 AM  [facility] [address] [place], [place], [address] Phone: [phone]   Fax:  [phone]  Name: Megan Hooper MRN: [id number] Date of Birth: 1955/09/14

## 2019-02-21 ENCOUNTER — Ambulatory Visit: Payer: BC Managed Care – PPO

## 2019-02-21 ENCOUNTER — Other Ambulatory Visit: Payer: Self-pay

## 2019-02-21 DIAGNOSIS — M25612 Stiffness of left shoulder, not elsewhere classified: Secondary | ICD-10-CM

## 2019-02-21 DIAGNOSIS — M25512 Pain in left shoulder: Secondary | ICD-10-CM | POA: Diagnosis not present

## 2019-02-21 DIAGNOSIS — G8929 Other chronic pain: Secondary | ICD-10-CM | POA: Diagnosis not present

## 2019-02-21 NOTE — Therapy (Signed)
[place], [place], [address] Phone: [phone]   Fax:  [phone]  Physical Therapy Treatment  Patient Details  Name: Megan Hooper MRN: [id number] Date of Birth: March 09, 1956 Referring Provider (PT): Corliss Parish, MD   Encounter Date: 02/21/2019  PT End of Session - 02/21/19 0706    Visit Number  14    Number of Visits  22    Date for PT Re-Evaluation  03/25/19    Authorization Type  BCBS    PT Start Time  0705    PT Stop Time  0745    PT Time Calculation (min)  40 min    Activity Tolerance  Patient tolerated treatment well    Behavior During Therapy  [facility] for tasks assessed/performed       Past Medical History:  Diagnosis Date  . Allergy   . Anemia   . Asthma   . Clavicle fracture    Left  . Dyslipidemia   . Leukopenia    mild  . Osteopenia   . Overactive bladder     Past Surgical History:  Procedure Laterality Date  . ABDOMINAL HYSTERECTOMY    . ORIF CLAVICULAR FRACTURE Left 07/27/2018   OPEN REDUCTION INTERNAL FIXATION (ORIF) LEFT CLAVICULAR FRACTURE AND ILIAC CREST BONE MARROW ASPIRATION FOR GRAFTING  . ORIF CLAVICULAR FRACTURE Left 07/27/2018   Procedure: OPEN REDUCTION INTERNAL FIXATION (ORIF) LEFT CLAVICULAR FRACTURE AND POSSIBLE BONE MARROW ASPIRATION FOR GRAFTING;  Surgeon: Meredith Pel, MD;  Location: [facility];  Service: Orthopedics;  Laterality: Left;    There were no vitals filed for this visit.  Subjective Assessment - 02/21/19 0710    Subjective  No pain.  Intermittant pain.  sore with use.    Currently in Pain?  No/denies                       [facility] Adult PT Treatment/Exercise - 02/21/19 0001      Shoulder Exercises: Sidelying   External Rotation  Left;10 reps    External Rotation Weight (lbs)  3   2 sets and  10 with 4#   ABduction  Left;15 reps    ABduction Weight (lbs)  3    Other Sidelying Exercises  iometric hold with ER cue to ont  extend wrist.  x 6 10 sec or more.         Shoulder Exercises: Standing   External Rotation  Left;15 reps    Theraband Level (Shoulder External Rotation)  Level 4 (Blue)    Internal Rotation  Left;15 reps    Theraband Level (Shoulder Internal Rotation)  Level 4 (Blue)    Extension  Both;20 reps    Theraband Level (Shoulder Extension)  Level 4 (Blue)    Row  Both;20 reps    Theraband Level (Shoulder Row)  Level 4 (Blue)    Other Standing Exercises  counter pushups x 20        Reaching vcabinet x 10 3 and 4 # x 10    Other Standing Exercises             flexion stretch at wall x 15 with lift off wall at end ronge      Shoulder Exercises: ROM/Strengthening   UBE (Upper Arm Bike)  L3 3 min forward and 3 back      Manual Therapy   Joint Mobilization  Grade 3 A/P and inferior  glaides     Passive ROM  L GHJ, all motions                PT Short Term Goals - 01/18/19 0708      PT SHORT TERM GOAL #1   Title  Pt to be independent with initial HEP    Status  Achieved      PT SHORT TERM GOAL #2   Title  Pt to demo improved PROM for shoulder flex by at least 10 degrees    Status  Achieved        PT Long Term Goals - 02/21/19 0747      Additional Long Term Goals   Additional Long Term Goals  Yes      PT LONG TERM GOAL #6   Title  She will report able to close trunk of car without effort.    Time  4    Period  Weeks    Status  New            Plan - 02/21/19 0706    Clinical Impression Statement  No  measuring today so will do next session. REports tired and sore post but no complaints. told Ms Geeslin we would continue thropugh Dec 25th  and if not better with ROM will ask he rto see MD    PT Treatment/Interventions  ADLs/Self Care Home Management;Cryotherapy;Electrical Stimulation;DME Instruction;Ultrasound;Moist Heat;Iontophoresis 4mg /ml Dexamethasone;Functional mobility training;Therapeutic activities;Therapeutic exercise;Patient/family education;Neuromuscular  re-education;Manual techniques;Scar mobilization;Taping;Dry needling;Passive range of motion;Joint Manipulations;Vasopneumatic Device;Canalith Repostioning;Vestibular    PT Next Visit Plan  continue shoulder rehab per POC  strength and ROM , assess vestibular system PRN    PT Home Exercise Plan  [id number]   red band rows and shoulder extension  corner ER stretch , wall stretch into flexion,    sleeper stretch    Consulted and Agree with Plan of Care  Patient       Patient will benefit from skilled therapeutic intervention in order to improve the following deficits and impairments:  Pain, Increased muscle spasms, Decreased scar mobility, Decreased mobility, Decreased activity tolerance, Decreased endurance, Decreased range of motion, Decreased strength, Impaired UE functional use, Impaired flexibility  Visit Diagnosis: Stiffness of left shoulder, not elsewhere classified  Chronic left shoulder pain     Problem List Patient Active Problem List   Diagnosis Date Noted  . Nausea 07/27/2018  . Clavicle fracture, shaft 07/27/2018  . Osteopenia 12/20/2015  . Allergy to cats 12/19/2015  . Asthma, mild intermittent 12/19/2015  . Hyperlipidemia 09/19/2014  . Hot flashes 09/19/2014    [name]  PT 02/21/2019, 7:48 AM  [facility] [address] [place], [place], [address] Phone: [phone]   Fax:  [phone]  Name: Megan Hooper MRN: [id number] Date of Birth: 08/23/1955

## 2019-02-22 ENCOUNTER — Ambulatory Visit: Payer: BC Managed Care – PPO

## 2019-02-22 DIAGNOSIS — M25612 Stiffness of left shoulder, not elsewhere classified: Secondary | ICD-10-CM

## 2019-02-22 DIAGNOSIS — M25512 Pain in left shoulder: Secondary | ICD-10-CM

## 2019-02-22 DIAGNOSIS — G8929 Other chronic pain: Secondary | ICD-10-CM

## 2019-02-22 NOTE — Therapy (Signed)
[place], [place], [address] Phone: [phone]   Fax:  [phone]  Physical Therapy Treatment  Patient Details  Name: Megan Hooper MRN: [id number] Date of Birth: 01/26/1956 Referring Provider (PT): Corliss Parish, MD   Encounter Date: 02/22/2019  PT End of Session - 02/22/19 0711    Visit Number  15    Number of Visits  22    Date for PT Re-Evaluation  03/25/19    Authorization Type  BCBS    PT Start Time  0709   pt late   PT Stop Time  0748    PT Time Calculation (min)  39 min    Activity Tolerance  Patient tolerated treatment well    Behavior During Therapy  [facility] for tasks assessed/performed       Past Medical History:  Diagnosis Date  . Allergy   . Anemia   . Asthma   . Clavicle fracture    Left  . Dyslipidemia   . Leukopenia    mild  . Osteopenia   . Overactive bladder     Past Surgical History:  Procedure Laterality Date  . ABDOMINAL HYSTERECTOMY    . ORIF CLAVICULAR FRACTURE Left 07/27/2018   OPEN REDUCTION INTERNAL FIXATION (ORIF) LEFT CLAVICULAR FRACTURE AND ILIAC CREST BONE MARROW ASPIRATION FOR GRAFTING  . ORIF CLAVICULAR FRACTURE Left 07/27/2018   Procedure: OPEN REDUCTION INTERNAL FIXATION (ORIF) LEFT CLAVICULAR FRACTURE AND POSSIBLE BONE MARROW ASPIRATION FOR GRAFTING;  Surgeon: Meredith Pel, MD;  Location: [place];  Service: Orthopedics;  Laterality: Left;    There were no vitals filed for this visit.  Subjective Assessment - 02/22/19 0710    Subjective  Doing well    Currently in Pain?  No/denies         [facility] PT Assessment - 02/22/19 0001      AROM   Left Shoulder Extension  45 Degrees    Left Shoulder Flexion  130 Degrees    Left Shoulder ABduction  128 Degrees   slightly in front of saggital plane   Left Shoulder Internal Rotation  --   reaching behind back 5 inches less than RT.    Left Shoulder External Rotation  --   3 inches less than Rt  overhead to thoracic spine.                   [place] Adult PT Treatment/Exercise - 02/22/19 0001      Shoulder Exercises: Standing   External Rotation  Left;15 reps    Theraband Level (Shoulder External Rotation)  Level 3 (Green)    Internal Rotation  Left;15 reps    Theraband Level (Shoulder Internal Rotation)  Level 4 (Blue)    Other Standing Exercises  counter pushups x 20        Reaching vcabinet   4 x 10 3#    Other Standing Exercises             flexion stretch at wall x 15 with lift off wall at end ronge      Shoulder Exercises: ROM/Strengthening   UBE (Upper Arm Bike)  L3.2   3 min forward and 3 back    Lat Pull  15 reps    Lat Pull Limitations  25#    Cybex Row  15 reps    Cybex Row Limitations  25#      Manual Therapy   Joint Mobilization  Grade 3 - 4  A/P  and inferior  glaides     Passive ROM  L GHJ, all motions                PT Short Term Goals - 01/18/19 0708      PT SHORT TERM GOAL #1   Title  Pt to be independent with initial HEP    Status  Achieved      PT SHORT TERM GOAL #2   Title  Pt to demo improved PROM for shoulder flex by at least 10 degrees    Status  Achieved        PT Long Term Goals - 02/21/19 0747      Additional Long Term Goals   Additional Long Term Goals  Yes      PT LONG TERM GOAL #6   Title  She will report able to close trunk of car without effort.    Time  4    Period  Weeks    Status  New            Plan - 02/22/19 [id number]    Clinical Impression Statement  ROM with no significant changes. Will contnue for 4 weeks and if ROM not significantly better may need MD to address.    PT Treatment/Interventions  ADLs/Self Care Home Management;Cryotherapy;Electrical Stimulation;DME Instruction;Ultrasound;Moist Heat;Iontophoresis 4mg /ml Dexamethasone;Functional mobility training;Therapeutic activities;Therapeutic exercise;Patient/family education;Neuromuscular re-education;Manual techniques;Scar  mobilization;Taping;Dry needling;Passive range of motion;Joint Manipulations;Vasopneumatic Device;Canalith Repostioning;Vestibular    PT Next Visit Plan  continue shoulder rehab per POC  strength and ROM , assess vestibular system PRN            GOALS    PT Home Exercise Plan  [id number]   red band rows and shoulder extension  corner ER stretch , wall stretch into flexion,    sleeper stretch    Consulted and Agree with Plan of Care  Patient       Patient will benefit from skilled therapeutic intervention in order to improve the following deficits and impairments:  Pain, Increased muscle spasms, Decreased scar mobility, Decreased mobility, Decreased activity tolerance, Decreased endurance, Decreased range of motion, Decreased strength, Impaired UE functional use, Impaired flexibility  Visit Diagnosis: Stiffness of left shoulder, not elsewhere classified  Chronic left shoulder pain     Problem List Patient Active Problem List   Diagnosis Date Noted  . Nausea 07/27/2018  . Clavicle fracture, shaft 07/27/2018  . Osteopenia 12/20/2015  . Allergy to cats 12/19/2015  . Asthma, mild intermittent 12/19/2015  . Hyperlipidemia 09/19/2014  . Hot flashes 09/19/2014    Megan Hooper   PT 02/22/2019, 7:58 AM  [facility] [address] [place], [place], [address] Phone: [phone]   Fax:  [phone]  Name: Megan Hooper MRN: [id number] Date of Birth: 01-28-1956

## 2019-02-28 ENCOUNTER — Other Ambulatory Visit: Payer: Self-pay

## 2019-02-28 ENCOUNTER — Ambulatory Visit: Payer: BC Managed Care – PPO

## 2019-02-28 DIAGNOSIS — M25512 Pain in left shoulder: Secondary | ICD-10-CM | POA: Diagnosis not present

## 2019-02-28 DIAGNOSIS — M25612 Stiffness of left shoulder, not elsewhere classified: Secondary | ICD-10-CM

## 2019-02-28 DIAGNOSIS — G8929 Other chronic pain: Secondary | ICD-10-CM | POA: Diagnosis not present

## 2019-02-28 NOTE — Therapy (Signed)
[place], [place], [address] Phone: [phone]   Fax:  [phone]  Physical Therapy Treatment  Patient Details  Name: MAURICA BELZ MRN: [id number] Date of Birth: 03-24-1956 Referring Provider (PT): Corliss Parish, MD   Encounter Date: 02/28/2019  PT End of Session - 02/28/19 0707    Visit Number  16    Number of Visits  22    Date for PT Re-Evaluation  03/25/19    Authorization Type  BCBS    PT Start Time  0705    PT Stop Time  0744    PT Time Calculation (min)  39 min    Activity Tolerance  Patient tolerated treatment well    Behavior During Therapy  [facility] for tasks assessed/performed       Past Medical History:  Diagnosis Date  . Allergy   . Anemia   . Asthma   . Clavicle fracture    Left  . Dyslipidemia   . Leukopenia    mild  . Osteopenia   . Overactive bladder     Past Surgical History:  Procedure Laterality Date  . ABDOMINAL HYSTERECTOMY    . ORIF CLAVICULAR FRACTURE Left 07/27/2018   OPEN REDUCTION INTERNAL FIXATION (ORIF) LEFT CLAVICULAR FRACTURE AND ILIAC CREST BONE MARROW ASPIRATION FOR GRAFTING  . ORIF CLAVICULAR FRACTURE Left 07/27/2018   Procedure: OPEN REDUCTION INTERNAL FIXATION (ORIF) LEFT CLAVICULAR FRACTURE AND POSSIBLE BONE MARROW ASPIRATION FOR GRAFTING;  Surgeon: Meredith Pel, MD;  Location: [place];  Service: Orthopedics;  Laterality: Left;    There were no vitals filed for this visit.  Subjective Assessment - 02/28/19 0748    Subjective  No worse since last session.  continues with intermittant pain.    Currently in Pain?  No/denies                       [facility] Adult PT Treatment/Exercise - 02/28/19 0001      Shoulder Exercises: Sidelying   External Rotation  Left;10 reps    External Rotation Weight (lbs)  3    External Rotation Limitations  3 sets    ABduction  Left;10 reps    ABduction Weight (lbs)  3    ABduction Limitations  3  sets      Shoulder Exercises: Standing   Other Standing Exercises  counter pushups x 20        Reaching vcabinet   5 x 10 3#    Other Standing Exercises             flexion stretch at wall x 15 with lift off wall at end ronge      Shoulder Exercises: ROM/Strengthening   UBE (Upper Arm Bike)  L3.2   3 min forward and 3 back    Lat Pull  20 reps    Lat Pull Limitations  25#    Cybex Row  20 reps    Cybex Row Limitations  25#      Manual Therapy   Joint Mobilization  Grade 3 - 4  A/P and inferior  glaides     Passive ROM  L GHJ, all motions                PT Short Term Goals - 01/18/19 0708      PT SHORT TERM GOAL #1   Title  Pt to be independent with initial HEP    Status  Achieved  PT SHORT TERM GOAL #2   Title  Pt to demo improved PROM for shoulder flex by at least 10 degrees    Status  Achieved        PT Long Term Goals - 02/28/19 0708      PT LONG TERM GOAL #1   Title  Pt to be independent with final HEP    Status  On-going      PT LONG TERM GOAL #2   Title  Pt to demo improved PROM and AROM to be WNL for L shoulder, to improve ability for ADLs and IADLs.    Baseline  progressing but still limited    Status  On-going      PT LONG TERM GOAL #3   Title  Pt to demo improved strength of L Shoulder to at least 4/5 for elevation, and 4+/5 for rotation,  to improve ability for IADLs.    Status  On-going      PT LONG TERM GOAL #4   Title  Pt to demo decreased pain in L shoulder to 0-2/10 with activity    Baseline  0ccasional higher if higher level of activity    Status  Achieved      PT LONG TERM GOAL #5   Title  report resolve of vertigo    Status  Achieved      PT LONG TERM GOAL #6   Title  She will report able to close trunk of car without effort.    Status  On-going            Plan - 02/28/19 0710    Clinical Impression Statement  No measurements today. Tolerated though painful. Will measure next session,  Cont to progress strength    PT  Treatment/Interventions  ADLs/Self Care Home Management;Cryotherapy;Electrical Stimulation;DME Instruction;Ultrasound;Moist Heat;Iontophoresis 4mg /ml Dexamethasone;Functional mobility training;Therapeutic activities;Therapeutic exercise;Patient/family education;Neuromuscular re-education;Manual techniques;Scar mobilization;Taping;Dry needling;Passive range of motion;Joint Manipulations;Vasopneumatic Device;Canalith Repostioning;Vestibular    PT Next Visit Plan  continue shoulder rehab per POC  strength and ROM , assess vestibular system PRN            GOALS    PT Home Exercise Plan  [id number]   red band rows and shoulder extension  corner ER stretch , wall stretch into flexion,    sleeper stretch    Consulted and Agree with Plan of Care  Patient       Patient will benefit from skilled therapeutic intervention in order to improve the following deficits and impairments:  Pain, Increased muscle spasms, Decreased scar mobility, Decreased mobility, Decreased activity tolerance, Decreased endurance, Decreased range of motion, Decreased strength, Impaired UE functional use, Impaired flexibility  Visit Diagnosis: Stiffness of left shoulder, not elsewhere classified  Chronic left shoulder pain     Problem List Patient Active Problem List   Diagnosis Date Noted  . Nausea 07/27/2018  . Clavicle fracture, shaft 07/27/2018  . Osteopenia 12/20/2015  . Allergy to cats 12/19/2015  . Asthma, mild intermittent 12/19/2015  . Hyperlipidemia 09/19/2014  . Hot flashes 09/19/2014    [name]  PT 02/28/2019, 7:48 AM  [facility] [address] [place], [place], [address] Phone: [phone]   Fax:  [phone]  Name: MATHEW COSCARELLI MRN: [id number] Date of Birth: 10/08/1955

## 2019-03-10 ENCOUNTER — Ambulatory Visit: Payer: BC Managed Care – PPO | Attending: Orthopedic Surgery

## 2019-03-10 ENCOUNTER — Other Ambulatory Visit: Payer: Self-pay

## 2019-03-10 DIAGNOSIS — G8929 Other chronic pain: Secondary | ICD-10-CM | POA: Diagnosis not present

## 2019-03-10 DIAGNOSIS — M25612 Stiffness of left shoulder, not elsewhere classified: Secondary | ICD-10-CM | POA: Diagnosis not present

## 2019-03-10 DIAGNOSIS — M25512 Pain in left shoulder: Secondary | ICD-10-CM | POA: Insufficient documentation

## 2019-03-10 NOTE — Therapy (Signed)
[place], [place], [address] Phone: [phone]   Fax:  [phone]  Physical Therapy Treatment  Patient Details  Name: Megan Hooper MRN: [id number] Date of Birth: 1955-11-19 Referring Provider (PT): Corliss Parish, MD   Encounter Date: 03/10/2019  PT End of Session - 03/10/19 0714    Visit Number  17    Number of Visits  22    Date for PT Re-Evaluation  03/25/19    Authorization Type  BCBS    PT Start Time  0708   pt late   PT Stop Time  0746    PT Time Calculation (min)  38 min    Activity Tolerance  Patient tolerated treatment well    Behavior During Therapy  [facility] for tasks assessed/performed       Past Medical History:  Diagnosis Date  . Allergy   . Anemia   . Asthma   . Clavicle fracture    Left  . Dyslipidemia   . Leukopenia    mild  . Osteopenia   . Overactive bladder     Past Surgical History:  Procedure Laterality Date  . ABDOMINAL HYSTERECTOMY    . ORIF CLAVICULAR FRACTURE Left 07/27/2018   OPEN REDUCTION INTERNAL FIXATION (ORIF) LEFT CLAVICULAR FRACTURE AND ILIAC CREST BONE MARROW ASPIRATION FOR GRAFTING  . ORIF CLAVICULAR FRACTURE Left 07/27/2018   Procedure: OPEN REDUCTION INTERNAL FIXATION (ORIF) LEFT CLAVICULAR FRACTURE AND POSSIBLE BONE MARROW ASPIRATION FOR GRAFTING;  Surgeon: Meredith Pel, MD;  Location: [place];  Service: Orthopedics;  Laterality: Left;    There were no vitals filed for this visit.  Subjective Assessment - 03/10/19 0715    Subjective  No worse since last session.  continues with intermittant pain. No early appointments available.  so gap in PT visits    Currently in Pain?  No/denies         [facility] PT Assessment - 03/10/19 0001      AROM   Left Shoulder Flexion  140 Degrees   supine    Left Shoulder ABduction  120 Degrees   supine so no compensation of arm forward of sag plane   Left Shoulder Internal Rotation  70 Degrees   supine    Left Shoulder External Rotation  45 Degrees   supine     PROM   Left Shoulder Flexion  150 Degrees    Left Shoulder ABduction  115 Degrees    Left Shoulder Internal Rotation  70 Degrees    Left Shoulder External Rotation  65 Degrees                   [facility] Adult PT Treatment/Exercise - 03/10/19 0001      Shoulder Exercises: Standing   Other Standing Exercises  counter pushups x 25       Reaching vcabinet   5 x 10 3#  ball ion wall circles x 20- CW/CCW      Shoulder Exercises: ROM/Strengthening   UBE (Upper Arm Bike)  L3.2   3 min forward and 3 back    Lat Pull  20 reps    Lat Pull Limitations  25#    Cybex Row  20 reps    Cybex Row Limitations  25#      Manual Therapy   Joint Mobilization  Grade 3 - 4  A/P and inferior  glaides     Passive ROM  L GHJ, all motions  PT Short Term Goals - 01/18/19 0708      PT SHORT TERM GOAL #1   Title  Pt to be independent with initial HEP    Status  Achieved      PT SHORT TERM GOAL #2   Title  Pt to demo improved PROM for shoulder flex by at least 10 degrees    Status  Achieved        PT Long Term Goals - 03/10/19 0939      PT LONG TERM GOAL #1   Title  Pt to be independent with final HEP    Status  On-going      PT LONG TERM GOAL #2   Title  Pt to demo improved PROM and AROM to be WNL for L shoulder, to improve ability for ADLs and IADLs.    Baseline  progressing but still limited    Status  On-going      PT LONG TERM GOAL #3   Title  Pt to demo improved strength of L Shoulder to at least 4/5 for elevation, and 4+/5 for rotation,  to improve ability for IADLs.    Baseline  continues weak functionally somewhat limited by stiffness of shoulder    Status  On-going      PT LONG TERM GOAL #4   Title  Pt to demo decreased pain in L shoulder to 0-2/10 with activity    Baseline  0ccasional higher if higher level of activity    Status  On-going      PT LONG TERM GOAL #6   Title  She will report  able to close trunk of car without effort.    Status  On-going            Plan - 03/10/19 0714    Clinical Impression Statement  Range not better today and taken inn supine so will measure next week  in sitting. Discussed progress and plan after next 2 week  if not measureing better.    PT Treatment/Interventions  ADLs/Self Care Home Management;Cryotherapy;Electrical Stimulation;DME Instruction;Ultrasound;Moist Heat;Iontophoresis 4mg /ml Dexamethasone;Functional mobility training;Therapeutic activities;Therapeutic exercise;Patient/family education;Neuromuscular re-education;Manual techniques;Scar mobilization;Taping;Dry needling;Passive range of motion;Joint Manipulations;Vasopneumatic Device;Canalith Repostioning;Vestibular    PT Next Visit Plan  continue shoulder rehab per POC  strength and ROM , assess vestibular system PRN            GOALS    PT Home Exercise Plan  [id number]   red band rows and shoulder extension  corner ER stretch , wall stretch into flexion,    sleeper stretch    Consulted and Agree with Plan of Care  Patient       Patient will benefit from skilled therapeutic intervention in order to improve the following deficits and impairments:  Pain, Increased muscle spasms, Decreased scar mobility, Decreased mobility, Decreased activity tolerance, Decreased endurance, Decreased range of motion, Decreased strength, Impaired UE functional use, Impaired flexibility  Visit Diagnosis: Stiffness of left shoulder, not elsewhere classified  Chronic left shoulder pain     Problem List Patient Active Problem List   Diagnosis Date Noted  . Nausea 07/27/2018  . Clavicle fracture, shaft 07/27/2018  . Osteopenia 12/20/2015  . Allergy to cats 12/19/2015  . Asthma, mild intermittent 12/19/2015  . Hyperlipidemia 09/19/2014  . Hot flashes 09/19/2014    [name]  PT 03/10/2019, 9:41 AM  [facility] [address] [place], [place], [address] Phone: [phone]   Fax:  [phone]  Name: Megan Shutter  Hooper MRN: [id number] Date of Birth: 08/12/1955

## 2019-03-14 ENCOUNTER — Other Ambulatory Visit: Payer: Self-pay

## 2019-03-14 ENCOUNTER — Ambulatory Visit: Payer: BC Managed Care – PPO

## 2019-03-14 DIAGNOSIS — M25512 Pain in left shoulder: Secondary | ICD-10-CM

## 2019-03-14 DIAGNOSIS — G8929 Other chronic pain: Secondary | ICD-10-CM | POA: Diagnosis not present

## 2019-03-14 DIAGNOSIS — M25612 Stiffness of left shoulder, not elsewhere classified: Secondary | ICD-10-CM | POA: Diagnosis not present

## 2019-03-14 NOTE — Therapy (Signed)
[place], [place], [address] Phone: [phone]   Fax:  [phone]  Physical Therapy Treatment  Patient Details  Name: Megan Hooper MRN: [id number] Date of Birth: [birth year] Referring Provider (PT): Corliss Parish, MD   Encounter Date: 03/14/2019  PT End of Session - 03/14/19 0705    Visit Number  18    Number of Visits  22    Date for PT Re-Evaluation  03/25/19    Authorization Type  BCBS    PT Start Time  0702    PT Stop Time  0745    PT Time Calculation (min)  43 min    Activity Tolerance  Patient tolerated treatment well       Past Medical History:  Diagnosis Date  . Allergy   . Anemia   . Asthma   . Clavicle fracture    Left  . Dyslipidemia   . Leukopenia    mild  . Osteopenia   . Overactive bladder     Past Surgical History:  Procedure Laterality Date  . ABDOMINAL HYSTERECTOMY    . ORIF CLAVICULAR FRACTURE Left 07/27/2018   OPEN REDUCTION INTERNAL FIXATION (ORIF) LEFT CLAVICULAR FRACTURE AND ILIAC CREST BONE MARROW ASPIRATION FOR GRAFTING  . ORIF CLAVICULAR FRACTURE Left 07/27/2018   Procedure: OPEN REDUCTION INTERNAL FIXATION (ORIF) LEFT CLAVICULAR FRACTURE AND POSSIBLE BONE MARROW ASPIRATION FOR GRAFTING;  Surgeon: Meredith Pel, MD;  Location: [place];  Service: Orthopedics;  Laterality: Left;    There were no vitals filed for this visit.  Subjective Assessment - 03/14/19 0705    Subjective  No changes    Currently in Pain?  No/denies         [facility] PT Assessment - 03/14/19 0001      AROM   Left Shoulder Flexion  130 Degrees   sitting   Left Shoulder ABduction  130 Degrees   sitting                  [facility] Adult PT Treatment/Exercise - 03/14/19 0001      Shoulder Exercises: Seated   Other Seated Exercises  overhead 4# x 20 LT 5# x 20 RT. , active flexion for a stretch after manual stretching.       Shoulder Exercises: ROM/Strengthening   UBE  (Upper Arm Bike)  L3.2   3 min forward and 3 back    Lat Pull  20 reps    Lat Pull Limitations  35#    Cybex Row  20 reps    Cybex Row Limitations  35#      Shoulder Exercises: Stretch   Wall Stretch - Flexion Limitations  12 reps 10 sec      Manual Therapy   Joint Mobilization  Grade 3 - 4  A/P and inferior  glaides   MWM inf glide for flexion    Passive ROM  L GHJ, all motions                PT Short Term Goals - 01/18/19 0708      PT SHORT TERM GOAL #1   Title  Pt to be independent with initial HEP    Status  Achieved      PT SHORT TERM GOAL #2   Title  Pt to demo improved PROM for shoulder flex by at least 10 degrees    Status  Achieved        PT Long Term Goals - 03/14/19  [id number]      PT LONG TERM GOAL #1   Title  Pt to be independent with final HEP    Status  On-going      PT LONG TERM GOAL #2   Title  Pt to demo improved PROM and AROM to be WNL for L shoulder, to improve ability for ADLs and IADLs.    Baseline  progressing but still limited    Status  On-going            Plan - 03/14/19 0706    Clinical Impression Statement  sitting active flexion 150 post session. Will contnue to sork ROm and strenght. Discussed MUA again and she is reluctant /encourage her to continue HEP as it may take months to max RO and strength.    PT Treatment/Interventions  ADLs/Self Care Home Management;Cryotherapy;Electrical Stimulation;DME Instruction;Ultrasound;Moist Heat;Iontophoresis 4mg /ml Dexamethasone;Functional mobility training;Therapeutic activities;Therapeutic exercise;Patient/family education;Neuromuscular re-education;Manual techniques;Scar mobilization;Taping;Dry needling;Passive range of motion;Joint Manipulations;Vasopneumatic Device;Canalith Repostioning;Vestibular    PT Next Visit Plan  continue shoulder rehab per POC  strength and ROM , assess vestibular system PRN            GOALS    PT Home Exercise Plan  [id number]   red band rows and shoulder extension   corner ER stretch , wall stretch into flexion,    sleeper stretch    Consulted and Agree with Plan of Care  Patient       Patient will benefit from skilled therapeutic intervention in order to improve the following deficits and impairments:  Pain, Increased muscle spasms, Decreased scar mobility, Decreased mobility, Decreased activity tolerance, Decreased endurance, Decreased range of motion, Decreased strength, Impaired UE functional use, Impaired flexibility  Visit Diagnosis: Stiffness of left shoulder, not elsewhere classified  Chronic left shoulder pain     Problem List Patient Active Problem List   Diagnosis Date Noted  . Nausea 07/27/2018  . Clavicle fracture, shaft 07/27/2018  . Osteopenia 12/20/2015  . Allergy to cats 12/19/2015  . Asthma, mild intermittent 12/19/2015  . Hyperlipidemia 09/19/2014  . Hot flashes 09/19/2014    [name]  PT 03/14/2019, 7:49 AM  [facility] [address] [place], [place], [address] Phone: [phone]   Fax:  [phone]  Name: Megan Hooper MRN: [id number] Date of Birth: 1955/10/01

## 2019-03-17 ENCOUNTER — Other Ambulatory Visit: Payer: Self-pay

## 2019-03-17 ENCOUNTER — Ambulatory Visit: Payer: BC Managed Care – PPO

## 2019-03-17 DIAGNOSIS — M25512 Pain in left shoulder: Secondary | ICD-10-CM

## 2019-03-17 DIAGNOSIS — G8929 Other chronic pain: Secondary | ICD-10-CM | POA: Diagnosis not present

## 2019-03-17 DIAGNOSIS — M25612 Stiffness of left shoulder, not elsewhere classified: Secondary | ICD-10-CM

## 2019-03-17 NOTE — Therapy (Signed)
[place], [place], [address] Phone: [phone]   Fax:  [phone]  Physical Therapy Treatment  Patient Details  Name: VALMAI VONGUNTEN MRN: [id number] Date of Birth: 26-Mar-1956 Referring Provider (PT): Corliss Parish, MD   Encounter Date: 03/17/2019  PT End of Session - 03/17/19 0706    Visit Number  19    Number of Visits  22    Date for PT Re-Evaluation  03/25/19    Authorization Type  BCBS    PT Start Time  0705    PT Stop Time  0745    PT Time Calculation (min)  40 min    Activity Tolerance  Patient tolerated treatment well    Behavior During Therapy  [facility] for tasks assessed/performed       Past Medical History:  Diagnosis Date  . Allergy   . Anemia   . Asthma   . Clavicle fracture    Left  . Dyslipidemia   . Leukopenia    mild  . Osteopenia   . Overactive bladder     Past Surgical History:  Procedure Laterality Date  . ABDOMINAL HYSTERECTOMY    . ORIF CLAVICULAR FRACTURE Left 07/27/2018   OPEN REDUCTION INTERNAL FIXATION (ORIF) LEFT CLAVICULAR FRACTURE AND ILIAC CREST BONE MARROW ASPIRATION FOR GRAFTING  . ORIF CLAVICULAR FRACTURE Left 07/27/2018   Procedure: OPEN REDUCTION INTERNAL FIXATION (ORIF) LEFT CLAVICULAR FRACTURE AND POSSIBLE BONE MARROW ASPIRATION FOR GRAFTING;  Surgeon: Meredith Pel, MD;  Location: [place];  Service: Orthopedics;  Laterality: Left;    There were no vitals filed for this visit.  Subjective Assessment - 03/17/19 0710    Subjective  Doing well. no pain. Does have some pain with reaching with LT arm and lifting but eases off after.    Currently in Pain?  No/denies                       [facility] Adult PT Treatment/Exercise - 03/17/19 0001      Shoulder Exercises: Seated   Other Seated Exercises  overhead  LT 5# x 20 RT. , active flexion for a stretch after manual stretching.       Shoulder Exercises: Standing   Other Standing  Exercises  counter push ups x 25      Shoulder Exercises: ROM/Strengthening   UBE (Upper Arm Bike)  L4 3 min forw 3 back     Lat Pull  20 reps    Lat Pull Limitations  35#    Cybex Row  20 reps    Cybex Row Limitations  35#      Shoulder Exercises: Stretch   Wall Stretch - Flexion Limitations  12 reps 10 sec      Manual Therapy   Joint Mobilization  Grade 3 - 4  A/P and inferior  glaides   MWM inf glide for flexion    Passive ROM  L GHJ, all motions                PT Short Term Goals - 01/18/19 0708      PT SHORT TERM GOAL #1   Title  Pt to be independent with initial HEP    Status  Achieved      PT SHORT TERM GOAL #2   Title  Pt to demo improved PROM for shoulder flex by at least 10 degrees    Status  Achieved  PT Long Term Goals - 03/14/19 0748      PT LONG TERM GOAL #1   Title  Pt to be independent with final HEP    Status  On-going      PT LONG TERM GOAL #2   Title  Pt to demo improved PROM and AROM to be WNL for L shoulder, to improve ability for ADLs and IADLs.    Baseline  progressing but still limited    Status  On-going            Plan - 03/17/19 0710    Clinical Impression Statement  She reported  greater ease with closing trunk and getting clothes in an d out of washer. No change in AROM    PT Treatment/Interventions  ADLs/Self Care Home Management;Cryotherapy;Electrical Stimulation;DME Instruction;Ultrasound;Moist Heat;Iontophoresis 4mg /ml Dexamethasone;Functional mobility training;Therapeutic activities;Therapeutic exercise;Patient/family education;Neuromuscular re-education;Manual techniques;Scar mobilization;Taping;Dry needling;Passive range of motion;Joint Manipulations;Vasopneumatic Device;Canalith Repostioning;Vestibular    PT Next Visit Plan  continue shoulder rehab per POC  strength and ROM ,        GOALS    PT Home Exercise Plan  [id number]   red band rows and shoulder extension  corner ER stretch , wall stretch into flexion,     sleeper stretch    Consulted and Agree with Plan of Care  Patient       Patient will benefit from skilled therapeutic intervention in order to improve the following deficits and impairments:  Pain, Increased muscle spasms, Decreased scar mobility, Decreased mobility, Decreased activity tolerance, Decreased endurance, Decreased range of motion, Decreased strength, Impaired UE functional use, Impaired flexibility  Visit Diagnosis: Stiffness of left shoulder, not elsewhere classified  Chronic left shoulder pain     Problem List Patient Active Problem List   Diagnosis Date Noted  . Nausea 07/27/2018  . Clavicle fracture, shaft 07/27/2018  . Osteopenia 12/20/2015  . Allergy to cats 12/19/2015  . Asthma, mild intermittent 12/19/2015  . Hyperlipidemia 09/19/2014  . Hot flashes 09/19/2014    [name] PT 03/17/2019, 8:32 AM  [facility] [address] [place], [place], [address] Phone: [phone]   Fax:  [phone]  Name: RIVERLYN COYKENDALL MRN: [id number] Date of Birth: Mar 06, 1956

## 2019-03-21 ENCOUNTER — Ambulatory Visit: Payer: BC Managed Care – PPO

## 2019-03-21 ENCOUNTER — Other Ambulatory Visit: Payer: Self-pay

## 2019-03-21 DIAGNOSIS — M25612 Stiffness of left shoulder, not elsewhere classified: Secondary | ICD-10-CM

## 2019-03-21 DIAGNOSIS — G8929 Other chronic pain: Secondary | ICD-10-CM

## 2019-03-21 DIAGNOSIS — M25512 Pain in left shoulder: Secondary | ICD-10-CM | POA: Diagnosis not present

## 2019-03-21 NOTE — Therapy (Signed)
[place], [place], [address] Phone: [phone]   Fax:  [phone]  Physical Therapy Treatment  Patient Details  Name: Megan Hooper MRN: [id number] Date of Birth: [birth year] Referring Provider (PT): Corliss Parish, MD   Encounter Date: 03/21/2019  PT End of Session - 03/21/19 0706    Visit Number  20    Number of Visits  25    Date for PT Re-Evaluation  04/15/19    Authorization Type  BCBS    PT Start Time  0702    PT Stop Time  0744    PT Time Calculation (min)  42 min    Activity Tolerance  Patient tolerated treatment well    Behavior During Therapy  [facility] for tasks assessed/performed       Past Medical History:  Diagnosis Date  . Allergy   . Anemia   . Asthma   . Clavicle fracture    Left  . Dyslipidemia   . Leukopenia    mild  . Osteopenia   . Overactive bladder     Past Surgical History:  Procedure Laterality Date  . ABDOMINAL HYSTERECTOMY    . ORIF CLAVICULAR FRACTURE Left 07/27/2018   OPEN REDUCTION INTERNAL FIXATION (ORIF) LEFT CLAVICULAR FRACTURE AND ILIAC CREST BONE MARROW ASPIRATION FOR GRAFTING  . ORIF CLAVICULAR FRACTURE Left 07/27/2018   Procedure: OPEN REDUCTION INTERNAL FIXATION (ORIF) LEFT CLAVICULAR FRACTURE AND POSSIBLE BONE MARROW ASPIRATION FOR GRAFTING;  Surgeon: Meredith Pel, MD;  Location: [place];  Service: Orthopedics;  Laterality: Left;    There were no vitals filed for this visit.  Subjective Assessment - 03/21/19 0708    Subjective  No pain.    Patient Stated Goals  Decreased pain, improved movment.; improve dizziness    Currently in Pain?  No/denies         [facility] PT Assessment - 03/21/19 0001      PROM   Left Shoulder Internal Rotation  80 Degrees   supine   Left Shoulder External Rotation  90 Degrees   supine                  [facility] Adult PT Treatment/Exercise - 03/21/19 0001      Shoulder Exercises: Seated   Other Seated  Exercises  overhead  LT 5# x 20 RT.       Shoulder Exercises: Standing   Other Standing Exercises  counter push ups x 25      Shoulder Exercises: ROM/Strengthening   UBE (Upper Arm Bike)  L4 3 min forw 3 back     Lat Pull  20 reps    Lat Pull Limitations  35#    Cybex Row  20 reps    Cybex Row Limitations  35#      Shoulder Exercises: Stretch   Wall Stretch - Flexion Limitations  12 reps 10 sec      Manual Therapy   Joint Mobilization  Grade 3 - 4  A/P and inferior  glaides   MWM inf glide for flexion    Passive ROM  L GHJ, all motions                PT Short Term Goals - 01/18/19 0708      PT SHORT TERM GOAL #1   Title  Pt to be independent with initial HEP    Status  Achieved      PT SHORT TERM GOAL #2   Title  Pt to demo improved PROM for shoulder flex by at least 10 degrees    Status  Achieved        PT Long Term Goals - 03/21/19 0749      PT LONG TERM GOAL #1   Title  Pt to be independent with final HEP    Status  On-going      PT LONG TERM GOAL #2   Title  Pt to demo improved PROM and AROM to be WNL for L shoulder, to improve ability for ADLs and IADLs.    Baseline  progressing but still limited    Status  On-going      PT LONG TERM GOAL #3   Title  Pt to demo improved strength of L Shoulder to at least 4/5 for elevation, and 4+/5 for rotation,  to improve ability for IADLs.    Baseline  continues weak functionally somewhat limited by stiffness of shoulder    Status  On-going      PT LONG TERM GOAL #4   Title  Pt to demo decreased pain in L shoulder to 0-2/10 with activity    Baseline  0ccasional higher if higher level of activity  but eases off quickly    Status  Partially Met      PT LONG TERM GOAL #5   Title  report resolve of vertigo    Status  Achieved      PT LONG TERM GOAL #6   Title  She will report able to close trunk of car without effort.    Status  Achieved            Plan - 03/21/19 0706    Clinical Impression Statement   Her PROM and function is improved and hor abduction is limiting range aftecting function. she does not appear to need MUA if she will sttretch more at home  indoorway  and in corner for over head.  Will continue x 3 weeks then discharge    PT Frequency  2x / week    PT Duration  3 weeks   over a 4 week period   PT Treatment/Interventions  ADLs/Self Care Home Management;Cryotherapy;Electrical Stimulation;DME Instruction;Ultrasound;Moist Heat;Iontophoresis 47m/ml Dexamethasone;Functional mobility training;Therapeutic activities;Therapeutic exercise;Patient/family education;Neuromuscular re-education;Manual techniques;Scar mobilization;Taping;Dry needling;Passive range of motion;Joint Manipulations;Vasopneumatic Device;Canalith Repostioning;Vestibular    PT Next Visit Plan  continue shoulder rehab per POC  strength and ROM ,    PT Home Exercise Plan  [id number]   red band rows and shoulder extension  corner ER stretch , wall stretch into flexion,    sleeper stretch    Consulted and Agree with Plan of Care  Patient       Patient will benefit from skilled therapeutic intervention in order to improve the following deficits and impairments:  Pain, Increased muscle spasms, Decreased scar mobility, Decreased mobility, Decreased activity tolerance, Decreased endurance, Decreased range of motion, Decreased strength, Impaired UE functional use, Impaired flexibility  Visit Diagnosis: Stiffness of left shoulder, not elsewhere classified  Chronic left shoulder pain     Problem List Patient Active Problem List   Diagnosis Date Noted  . Nausea 07/27/2018  . Clavicle fracture, shaft 07/27/2018  . Osteopenia 12/20/2015  . Allergy to cats 12/19/2015  . Asthma, mild intermittent 12/19/2015  . Hyperlipidemia 09/19/2014  . Hot flashes 09/19/2014    CDarrel Hoover PT 03/21/2019, 7:54 AM  [facility] [address]Phone:  [phone]  Fax:  [phone]  Name: BELLAROSE BURTT MRN: [id number] Date of Birth: Jan 28, 1956

## 2019-03-24 ENCOUNTER — Ambulatory Visit: Payer: BC Managed Care – PPO

## 2019-03-24 ENCOUNTER — Other Ambulatory Visit: Payer: Self-pay

## 2019-03-24 DIAGNOSIS — M25512 Pain in left shoulder: Secondary | ICD-10-CM

## 2019-03-24 DIAGNOSIS — G8929 Other chronic pain: Secondary | ICD-10-CM

## 2019-03-24 DIAGNOSIS — M25612 Stiffness of left shoulder, not elsewhere classified: Secondary | ICD-10-CM

## 2019-03-24 NOTE — Therapy (Signed)
[place], [place], [address] Phone: [phone]   Fax:  [phone]  Physical Therapy Treatment  Patient Details  Name: Megan Hooper MRN: [id number] Date of Birth: 1956-01-29 Referring Provider (PT): Corliss Parish, MD   Encounter Date: 03/24/2019  PT End of Session - 03/24/19 0746    Visit Number  21    Number of Visits  25    Date for PT Re-Evaluation  04/15/19    Authorization Type  BCBS    PT Start Time  0745    PT Stop Time  0825    PT Time Calculation (min)  40 min    Activity Tolerance  Patient tolerated treatment well    Behavior During Therapy  [facility] for tasks assessed/performed       Past Medical History:  Diagnosis Date  . Allergy   . Anemia   . Asthma   . Clavicle fracture    Left  . Dyslipidemia   . Leukopenia    mild  . Osteopenia   . Overactive bladder     Past Surgical History:  Procedure Laterality Date  . ABDOMINAL HYSTERECTOMY    . ORIF CLAVICULAR FRACTURE Left 07/27/2018   OPEN REDUCTION INTERNAL FIXATION (ORIF) LEFT CLAVICULAR FRACTURE AND ILIAC CREST BONE MARROW ASPIRATION FOR GRAFTING  . ORIF CLAVICULAR FRACTURE Left 07/27/2018   Procedure: OPEN REDUCTION INTERNAL FIXATION (ORIF) LEFT CLAVICULAR FRACTURE AND POSSIBLE BONE MARROW ASPIRATION FOR GRAFTING;  Surgeon: Meredith Pel, MD;  Location: [place];  Service: Orthopedics;  Laterality: Left;    There were no vitals filed for this visit.  Subjective Assessment - 03/24/19 0749    Subjective  No complaints. Still have trouble overhead    Currently in Pain?  No/denies                       [facility] Adult PT Treatment/Exercise - 03/24/19 0001      Shoulder Exercises: Standing   Other Standing Exercises  counter push and holds short ROm LT   then chair push ups 2x5 with pillow        Shoulder Exercises: ROM/Strengthening   UBE (Upper Arm Bike)  L4 3 min forw 3 back     Lat Pull  15 reps;2  plate    Lat Pull Limitations  Lt only    Cybex Row  15 reps;2 plate    Cybex Row Limitations  Lt only    Wall Pushups  10 reps    Wall Pushups Limitations  LT      Shoulder Exercises: Stretch   Wall Stretch - Flexion Limitations  12 reps 10 sec      Manual Therapy   Joint Mobilization  Grade 3 - 4  A/P and inferior  glaides   MWM inf glide for flexion    Passive ROM  L GHJ, all motions                PT Short Term Goals - 01/18/19 0708      PT SHORT TERM GOAL #1   Title  Pt to be independent with initial HEP    Status  Achieved      PT SHORT TERM GOAL #2   Title  Pt to demo improved PROM for shoulder flex by at least 10 degrees    Status  Achieved        PT Long Term Goals - 03/21/19 9518  PT LONG TERM GOAL #1   Title  Pt to be independent with final HEP    Status  On-going      PT LONG TERM GOAL #2   Title  Pt to demo improved PROM and AROM to be WNL for L shoulder, to improve ability for ADLs and IADLs.    Baseline  progressing but still limited    Status  On-going      PT LONG TERM GOAL #3   Title  Pt to demo improved strength of L Shoulder to at least 4/5 for elevation, and 4+/5 for rotation,  to improve ability for IADLs.    Baseline  continues weak functionally somewhat limited by stiffness of shoulder    Status  On-going      PT LONG TERM GOAL #4   Title  Pt to demo decreased pain in L shoulder to 0-2/10 with activity    Baseline  0ccasional higher if higher level of activity  but eases off quickly    Status  Partially Met      PT LONG TERM GOAL #5   Title  report resolve of vertigo    Status  Achieved      PT LONG TERM GOAL #6   Title  She will report able to close trunk of car without effort.    Status  Achieved            Plan - 03/24/19 0747    Clinical Impression Statement  Contnues to look looser so will contniue until 04/15/19 then discharge    PT Treatment/Interventions  ADLs/Self Care Home Management;Cryotherapy;Electrical  Stimulation;DME Instruction;Ultrasound;Moist Heat;Iontophoresis 40m/ml Dexamethasone;Functional mobility training;Therapeutic activities;Therapeutic exercise;Patient/family education;Neuromuscular re-education;Manual techniques;Scar mobilization;Taping;Dry needling;Passive range of motion;Joint Manipulations;Vasopneumatic Device;Canalith Repostioning;Vestibular    PT Next Visit Plan  continue shoulder rehab per POC  strength and ROM ,    PT Home Exercise Plan  [id number]   red band rows and shoulder extension  corner ER stretch , wall stretch into flexion,    sleeper stretch    Consulted and Agree with Plan of Care  Patient       Patient will benefit from skilled therapeutic intervention in order to improve the following deficits and impairments:  Pain, Increased muscle spasms, Decreased scar mobility, Decreased mobility, Decreased activity tolerance, Decreased endurance, Decreased range of motion, Decreased strength, Impaired UE functional use, Impaired flexibility  Visit Diagnosis: Stiffness of left shoulder, not elsewhere classified  Chronic left shoulder pain     Problem List Patient Active Problem List   Diagnosis Date Noted  . Nausea 07/27/2018  . Clavicle fracture, shaft 07/27/2018  . Osteopenia 12/20/2015  . Allergy to cats 12/19/2015  . Asthma, mild intermittent 12/19/2015  . Hyperlipidemia 09/19/2014  . Hot flashes 09/19/2014    CDarrel HooverPT 03/24/2019, 8:32 AM  [facility] [address]Phone: [phone]  Fax:  [phone] Name: Megan VANAKENMRN: [id number] of Birth: 1October 21, 1957

## 2019-03-29 ENCOUNTER — Other Ambulatory Visit: Payer: Self-pay

## 2019-03-29 ENCOUNTER — Ambulatory Visit: Payer: BC Managed Care – PPO

## 2019-03-29 DIAGNOSIS — M25612 Stiffness of left shoulder, not elsewhere classified: Secondary | ICD-10-CM | POA: Diagnosis not present

## 2019-03-29 DIAGNOSIS — M25512 Pain in left shoulder: Secondary | ICD-10-CM | POA: Diagnosis not present

## 2019-03-29 DIAGNOSIS — G8929 Other chronic pain: Secondary | ICD-10-CM

## 2019-03-29 NOTE — Therapy (Signed)
[place], [place], [address] Phone: [phone]   Fax:  [phone]  Physical Therapy Treatment  Patient Details  Name: Megan Hooper MRN: [id number] Date of Birth: [birth year] Referring Provider (PT): Corliss Parish, MD   Encounter Date: 03/29/2019  PT End of Session - 03/29/19 0703    Visit Number  22    Number of Visits  25    Date for PT Re-Evaluation  04/15/19    Authorization Type  BCBS    PT Start Time  0702    PT Stop Time  0745    PT Time Calculation (min)  43 min    Activity Tolerance  Patient tolerated treatment well    Behavior During Therapy  [facility] for tasks assessed/performed       Past Medical History:  Diagnosis Date  . Allergy   . Anemia   . Asthma   . Clavicle fracture    Left  . Dyslipidemia   . Leukopenia    mild  . Osteopenia   . Overactive bladder     Past Surgical History:  Procedure Laterality Date  . ABDOMINAL HYSTERECTOMY    . ORIF CLAVICULAR FRACTURE Left 07/27/2018   OPEN REDUCTION INTERNAL FIXATION (ORIF) LEFT CLAVICULAR FRACTURE AND ILIAC CREST BONE MARROW ASPIRATION FOR GRAFTING  . ORIF CLAVICULAR FRACTURE Left 07/27/2018   Procedure: OPEN REDUCTION INTERNAL FIXATION (ORIF) LEFT CLAVICULAR FRACTURE AND POSSIBLE BONE MARROW ASPIRATION FOR GRAFTING;  Surgeon: Meredith Pel, MD;  Location: [place];  Service: Orthopedics;  Laterality: Left;    There were no vitals filed for this visit.  Subjective Assessment - 03/29/19 0709    Subjective  No pain today. Feel s little stronger with activity at home    Currently in Pain?  No/denies                       [facility] Adult PT Treatment/Exercise - 03/29/19 0001      Shoulder Exercises: ROM/Strengthening   UBE (Upper Arm Bike)  L4 3 min forw 3 back     Lat Pull  15 reps;2 plate    Lat Pull Limitations  Lt only,  Freemotion    Cybex Row  15 reps;2 plate    Cybex Row Limitations  Lt only  free  motion    Wall Pushups  10 reps    Wall Pushups Limitations  LT    Other ROM/Strengthening Exercises  5# overhead x 20  standing and supine bench LT 6# x 15      Shoulder Exercises: Stretch   Wall Stretch - Flexion Limitations  12 reps 10 sec      Manual Therapy   Joint Mobilization  Grade 3 - 4  A/P and inferior  glaides   MWM inf glide for flexion    Passive ROM  L GHJ, all motions                PT Short Term Goals - 01/18/19 0708      PT SHORT TERM GOAL #1   Title  Pt to be independent with initial HEP    Status  Achieved      PT SHORT TERM GOAL #2   Title  Pt to demo improved PROM for shoulder flex by at least 10 degrees    Status  Achieved        PT Long Term Goals - 03/21/19 0749      PT  LONG TERM GOAL #1   Title  Pt to be independent with final HEP    Status  On-going      PT LONG TERM GOAL #2   Title  Pt to demo improved PROM and AROM to be WNL for L shoulder, to improve ability for ADLs and IADLs.    Baseline  progressing but still limited    Status  On-going      PT LONG TERM GOAL #3   Title  Pt to demo improved strength of L Shoulder to at least 4/5 for elevation, and 4+/5 for rotation,  to improve ability for IADLs.    Baseline  continues weak functionally somewhat limited by stiffness of shoulder    Status  On-going      PT LONG TERM GOAL #4   Title  Pt to demo decreased pain in L shoulder to 0-2/10 with activity    Baseline  0ccasional higher if higher level of activity  but eases off quickly    Status  Partially Met      PT LONG TERM GOAL #5   Title  report resolve of vertigo    Status  Achieved      PT LONG TERM GOAL #6   Title  She will report able to close trunk of car without effort.    Status  Achieved            Plan - 03/29/19 0704    Clinical Impression Statement  She reports    PT Treatment/Interventions  ADLs/Self Care Home Management;Cryotherapy;Electrical Stimulation;DME Instruction;Ultrasound;Moist  Heat;Iontophoresis 4mg/ml Dexamethasone;Functional mobility training;Therapeutic activities;Therapeutic exercise;Patient/family education;Neuromuscular re-education;Manual techniques;Scar mobilization;Taping;Dry needling;Passive range of motion;Joint Manipulations;Vasopneumatic Device;Canalith Repostioning;Vestibular    PT Next Visit Plan  continue shoulder rehab per POC  strength and ROM ,    PT Home Exercise Plan  [id number]   red band rows and shoulder extension  corner ER stretch , wall stretch into flexion,    sleeper stretch    Consulted and Agree with Plan of Care  Patient       Patient will benefit from skilled therapeutic intervention in order to improve the following deficits and impairments:  Pain, Increased muscle spasms, Decreased scar mobility, Decreased mobility, Decreased activity tolerance, Decreased endurance, Decreased range of motion, Decreased strength, Impaired UE functional use, Impaired flexibility  Visit Diagnosis: Stiffness of left shoulder, not elsewhere classified  Chronic left shoulder pain     Problem List Patient Active Problem List   Diagnosis Date Noted  . Nausea 07/27/2018  . Clavicle fracture, shaft 07/27/2018  . Osteopenia 12/20/2015  . Allergy to cats 12/19/2015  . Asthma, mild intermittent 12/19/2015  . Hyperlipidemia 09/19/2014  . Hot flashes 09/19/2014    Chasse, Stephen M  PT 03/29/2019, 7:44 AM  [facility] [address] [place], [place], [address] Phone: [phone]   Fax:  [phone]  Name: Megan Hooper MRN: [id number] Date of Birth: 11/30/1955   

## 2019-03-31 ENCOUNTER — Ambulatory Visit: Payer: BC Managed Care – PPO

## 2019-04-05 ENCOUNTER — Other Ambulatory Visit: Payer: Self-pay

## 2019-04-05 ENCOUNTER — Ambulatory Visit: Payer: BC Managed Care – PPO | Attending: Orthopedic Surgery

## 2019-04-05 DIAGNOSIS — G8929 Other chronic pain: Secondary | ICD-10-CM | POA: Insufficient documentation

## 2019-04-05 DIAGNOSIS — M25512 Pain in left shoulder: Secondary | ICD-10-CM | POA: Diagnosis not present

## 2019-04-05 DIAGNOSIS — M25612 Stiffness of left shoulder, not elsewhere classified: Secondary | ICD-10-CM | POA: Insufficient documentation

## 2019-04-05 NOTE — Therapy (Signed)
[place], [place], [address] Phone: [phone]   Fax:  [phone]  Physical Therapy Treatment  Patient Details  Name: Megan Hooper MRN: [id number] Date of Birth: 07/18/1955 Referring Provider (PT): Corliss Parish, MD   Encounter Date: 04/05/2019  PT End of Session - 04/05/19 0713    Visit Number  23    Number of Visits  25    Date for PT Re-Evaluation  04/15/19    Authorization Type  BCBS    PT Start Time  0704    PT Stop Time  0744    PT Time Calculation (min)  40 min    Activity Tolerance  Patient tolerated treatment well    Behavior During Therapy  [facility] for tasks assessed/performed       Past Medical History:  Diagnosis Date  . Allergy   . Anemia   . Asthma   . Clavicle fracture    Left  . Dyslipidemia   . Leukopenia    mild  . Osteopenia   . Overactive bladder     Past Surgical History:  Procedure Laterality Date  . ABDOMINAL HYSTERECTOMY    . ORIF CLAVICULAR FRACTURE Left 07/27/2018   OPEN REDUCTION INTERNAL FIXATION (ORIF) LEFT CLAVICULAR FRACTURE AND ILIAC CREST BONE MARROW ASPIRATION FOR GRAFTING  . ORIF CLAVICULAR FRACTURE Left 07/27/2018   Procedure: OPEN REDUCTION INTERNAL FIXATION (ORIF) LEFT CLAVICULAR FRACTURE AND POSSIBLE BONE MARROW ASPIRATION FOR GRAFTING;  Surgeon: Meredith Pel, MD;  Location: [place];  Service: Orthopedics;  Laterality: Left;    There were no vitals filed for this visit.  Subjective Assessment - 04/05/19 0712    Subjective  Arm feeling OK. She has pain with lying oin Lt side.    She can't htinkof anything limited with use of Lt arm.    Currently in Pain?  No/denies                       [facility] Adult PT Treatment/Exercise - 04/05/19 0001      Shoulder Exercises: ROM/Strengthening   UBE (Upper Arm Bike)  L4 3 min forw 3 back     Lat Pull  15 reps;2 plate    Lat Pull Limitations  Lt only,  Freemotion    Cybex Row  15 reps;2  plate    Cybex Row Limitations  Lt only  free motion    Wall Pushups  15 reps    Wall Pushups Limitations  LT    Other ROM/Strengthening Exercises  5# overhead x 20  standing and supine bench LT 6# x 15      Shoulder Exercises: Stretch   Wall Stretch - Flexion Limitations  12 reps 10 sec      Manual Therapy   Joint Mobilization  Grade 3 - 4  A/P and inferior  glaides   MWM inf glide for flexion    Passive ROM  L GHJ, all motions                PT Short Term Goals - 01/18/19 0708      PT SHORT TERM GOAL #1   Title  Pt to be independent with initial HEP    Status  Achieved      PT SHORT TERM GOAL #2   Title  Pt to demo improved PROM for shoulder flex by at least 10 degrees    Status  Achieved  PT Long Term Goals - 04/05/19 0800      PT LONG TERM GOAL #1   Title  Pt to be independent with final HEP    Status  On-going      PT LONG TERM GOAL #2   Title  Pt to demo improved PROM and AROM to be WNL for L shoulder, to improve ability for ADLs and IADLs.    Baseline  progressing but still limited    Status  On-going      PT LONG TERM GOAL #3   Title  Pt to demo improved strength of L Shoulder to at least 4/5 for elevation, and 4+/5 for rotation,  to improve ability for IADLs.    Baseline  continues weak functionally somewhat limited by stiffness of shoulder    Status  On-going      PT LONG TERM GOAL #4   Title  Pt to demo decreased pain in L shoulder to 0-2/10 with activity    Baseline  Pain primarily with stretching and lying on Lt shoulder.   Generallyl no pain with activity    Status  Partially Met            Plan - 04/05/19 0713    Clinical Impression Statement  She reports improved function and limited pain. She does feel weaker overhead and in general with Lt arm.    PT Treatment/Interventions  ADLs/Self Care Home Management;Cryotherapy;Electrical Stimulation;DME Instruction;Ultrasound;Moist Heat;Iontophoresis 32m/ml Dexamethasone;Functional  mobility training;Therapeutic activities;Therapeutic exercise;Patient/family education;Neuromuscular re-education;Manual techniques;Scar mobilization;Taping;Dry needling;Passive range of motion;Joint Manipulations;Vasopneumatic Device;Canalith Repostioning;Vestibular    PT Next Visit Plan  continue shoulder rehab per POC  strength and ROM ,    PT Home Exercise Plan  [id number]   red band rows and shoulder extension  corner ER stretch , wall stretch into flexion,    sleeper stretch    Consulted and Agree with Plan of Care  Patient       Patient will benefit from skilled therapeutic intervention in order to improve the following deficits and impairments:  Pain, Increased muscle spasms, Decreased scar mobility, Decreased mobility, Decreased activity tolerance, Decreased endurance, Decreased range of motion, Decreased strength, Impaired UE functional use, Impaired flexibility  Visit Diagnosis: Chronic left shoulder pain  Stiffness of left shoulder, not elsewhere classified     Problem List Patient Active Problem List   Diagnosis Date Noted  . Nausea 07/27/2018  . Clavicle fracture, shaft 07/27/2018  . Osteopenia 12/20/2015  . Allergy to cats 12/19/2015  . Asthma, mild intermittent 12/19/2015  . Hyperlipidemia 09/19/2014  . Hot flashes 09/19/2014    CDarrel Hooper PT 04/05/2019, 8:02 AM  [facility]GCarol[place] N[place] [address]Phone: [phone]  Fax:  [phone] Name: Megan WEYLANDMRN: [id number] of Birth: 105/02/1956

## 2019-04-11 ENCOUNTER — Ambulatory Visit: Payer: BC Managed Care – PPO

## 2019-04-11 ENCOUNTER — Other Ambulatory Visit: Payer: Self-pay

## 2019-04-11 DIAGNOSIS — M25512 Pain in left shoulder: Secondary | ICD-10-CM | POA: Diagnosis not present

## 2019-04-11 DIAGNOSIS — G8929 Other chronic pain: Secondary | ICD-10-CM | POA: Diagnosis not present

## 2019-04-11 DIAGNOSIS — M25612 Stiffness of left shoulder, not elsewhere classified: Secondary | ICD-10-CM | POA: Diagnosis not present

## 2019-04-11 NOTE — Therapy (Addendum)
[place], [place], [address] Phone: [phone]   Fax:  [phone]  Physical Therapy Treatment/Discharge  Patient Details  Name: Megan Hooper MRN: [id number] Date of Birth: February 05, 1956 Referring Provider (PT): Corliss Parish, MD   Encounter Date: 04/11/2019  PT End of Session - 04/11/19 0703    Visit Number  24    Number of Visits  25    Date for PT Re-Evaluation  04/15/19    Authorization Type  BCBS    PT Start Time  0703    PT Stop Time  6606    PT Time Calculation (min)  41 min       Past Medical History:  Diagnosis Date  . Allergy   . Anemia   . Asthma   . Clavicle fracture    Left  . Dyslipidemia   . Leukopenia    mild  . Osteopenia   . Overactive bladder     Past Surgical History:  Procedure Laterality Date  . ABDOMINAL HYSTERECTOMY    . ORIF CLAVICULAR FRACTURE Left 07/27/2018   OPEN REDUCTION INTERNAL FIXATION (ORIF) LEFT CLAVICULAR FRACTURE AND ILIAC CREST BONE MARROW ASPIRATION FOR GRAFTING  . ORIF CLAVICULAR FRACTURE Left 07/27/2018   Procedure: OPEN REDUCTION INTERNAL FIXATION (ORIF) LEFT CLAVICULAR FRACTURE AND POSSIBLE BONE MARROW ASPIRATION FOR GRAFTING;  Surgeon: Meredith Pel, MD;  Location: [place];  Service: Orthopedics;  Laterality: Left;    There were no vitals filed for this visit.  Subjective Assessment - 04/11/19 0704    Subjective  Stiff overhead and weak but no pain generally    Currently in Pain?  No/denies         [facility] PT Assessment - 04/11/19 0001      Assessment   Referring Provider (PT)  Corliss Parish, MD      AROM   Left Shoulder Flexion  153 Degrees   supine   Left Shoulder ABduction  152 Degrees   supine   Left Shoulder Internal Rotation  75 Degrees   supine   Left Shoulder External Rotation  70 Degrees   supine   Left Shoulder Horizontal ABduction  20 Degrees   supine   Left Shoulder Horizontal ADduction  120 Degrees   supine      PROM   Left Shoulder Extension  --   supine   Left Shoulder Flexion  160 Degrees    Left Shoulder Internal Rotation  80 Degrees    Left Shoulder External Rotation  92 Degrees   supine     Strength   Left Shoulder Flexion  4/5    Left Shoulder Extension  5/5    Left Shoulder ABduction  4/5    Left Shoulder Internal Rotation  4+/5    Left Shoulder External Rotation  4+/5    Left Shoulder Horizontal ABduction  4/5    Left Shoulder Horizontal ADduction  5/5      Palpation   Palpation comment  Tender lateral border of scapula                   [facility] Adult PT Treatment/Exercise - 04/11/19 0001      Shoulder Exercises: ROM/Strengthening   UBE (Upper Arm Bike)  L4 3 min forw 3 back     Lat Pull  15 reps;2 plate    Lat Pull Limitations  Lt only,  Freemotion    Cybex Row  15 reps;2 plate    Cybex Row Limitations  Lt only  free motion    Wall Pushups  15 reps    Wall Pushups Limitations  LT    Other ROM/Strengthening Exercises  5# overhead x 20  standing and supine bench LT 6# x 15      Manual Therapy   Joint Mobilization  Grade 3 - 4  A/P and inferior  glaides   MWM inf glide for flexion    Passive ROM  L GHJ, all motions                PT Short Term Goals - 01/18/19 0708      PT SHORT TERM GOAL #1   Title  Pt to be independent with initial HEP    Status  Achieved      PT SHORT TERM GOAL #2   Title  Pt to demo improved PROM for shoulder flex by at least 10 degrees    Status  Achieved        PT Long Term Goals - 04/11/19 0755      PT LONG TERM GOAL #1   Title  Pt to be independent with final HEP    Status  Achieved      PT LONG TERM GOAL #2   Title  Pt to demo improved PROM and AROM to be WNL for L shoulder, to improve ability for ADLs and IADLs.    Baseline  Passive WNL active less than    Status  Partially Met      PT LONG TERM GOAL #3   Title  Pt to demo improved strength of L Shoulder to at least 4/5 for elevation, and 4+/5 for  rotation,  to improve ability for IADLs.    Status  Achieved      PT LONG TERM GOAL #4   Title  Pt to demo decreased pain in L shoulder to 0-2/10 with activity    Status  Achieved      PT LONG TERM GOAL #5   Title  report resolve of vertigo    Status  Achieved      PT LONG TERM GOAL #6   Title  She will report able to close trunk of car without effort.    Status  Achieved            Plan - 04/11/19 0702    Clinical Impression Statement  Her ROM and strength is functional but stril weaker than RT shoulder and ROM active still less than passive.   If she continues to wrok on strnegth and ROM at home diligently I think in 2 months she should feel close to normal. She can return to MD if she feels the need that she is not progressing.    PT Treatment/Interventions  ADLs/Self Care Home Management;Cryotherapy;Electrical Stimulation;DME Instruction;Ultrasound;Moist Heat;Iontophoresis 3m/ml Dexamethasone;Functional mobility training;Therapeutic activities;Therapeutic exercise;Patient/family education;Neuromuscular re-education;Manual techniques;Scar mobilization;Taping;Dry needling;Passive range of motion;Joint Manipulations;Vasopneumatic Device;Canalith Repostioning;Vestibular    PT Next Visit Plan  Discharge with HEP    PT Home Exercise Plan  [id number]   red band rows and shoulder extension  corner ER stretch , wall stretch into flexion,    sleeper stretch    Consulted and Agree with Plan of Care  Patient       Patient will benefit from skilled therapeutic intervention in order to improve the following deficits and impairments:  Pain, Increased muscle spasms, Decreased scar mobility, Decreased mobility, Decreased activity tolerance, Decreased endurance, Decreased range of motion, Decreased strength, Impaired UE functional use, Impaired flexibility  Visit Diagnosis: Stiffness of left shoulder, not elsewhere classified  Chronic left shoulder pain     Problem List Patient Active  Problem List   Diagnosis Date Noted  . Nausea 07/27/2018  . Clavicle fracture, shaft 07/27/2018  . Osteopenia 12/20/2015  . Allergy to cats 12/19/2015  . Asthma, mild intermittent 12/19/2015  . Hyperlipidemia 09/19/2014  . Hot flashes 09/19/2014    [name]  PT 04/11/2019, 7:56 AM  [facility] [address] [place], [place], [address] Phone: [phone]   Fax:  [phone]  Name: Megan Hooper MRN: [id number] Date of Birth: 09-05-1955                               PHYSICAL THERAPY DISCHARGE SUMMARY  Visits from Start of Care: 24  Current functional level related to goals / functional outcomes: See above   Remaining deficits: See above   Education / Equipment: HEP  Plan: Patient agrees to discharge.  Patient goals were partially met. Patient is being discharged due to                                                     ?????    MAx benefit from PT at this time

## 2019-04-29 ENCOUNTER — Ambulatory Visit (INDEPENDENT_AMBULATORY_CARE_PROVIDER_SITE_OTHER): Payer: BC Managed Care – PPO | Admitting: Orthopedic Surgery

## 2019-04-29 ENCOUNTER — Other Ambulatory Visit: Payer: Self-pay

## 2019-04-29 ENCOUNTER — Ambulatory Visit: Payer: BC Managed Care – PPO | Admitting: Orthopedic Surgery

## 2019-04-29 DIAGNOSIS — M7502 Adhesive capsulitis of left shoulder: Secondary | ICD-10-CM | POA: Diagnosis not present

## 2019-04-29 NOTE — Progress Notes (Signed)
Office Visit Note   Patient: Megan Hooper           Date of Birth: May 06, 1955           MRN: [id number] Visit Date: 04/29/2019 Requested by: [name], MD [address] [place],  [place] [address] PCP: [name], MD  Subjective: Chief Complaint  Patient presents with  . Follow-up    HPI: Megan Hooper is a 64 y.o. female who presents to the office for follow-up of left shoulder adhesive capsulitis.  She is s/p left shoulder injection on 12/20/2018.  At her last office visit she was encouraged to go to outpatient physical therapy to continue working on shoulder range of motion after she was having difficulty with home exercise program.  Patient notes that since starting outpatient physical therapy she has improved by "80 to 85%".  She has little pain at this time.  Pain does not wake her up at night.  She does have some trouble sleeping on her left side but overall she feels she is doing very well.  She was doing physical therapy at [facility] outpatient therapy on [facility]  She has finished physical therapy at this point.  She will continue with a home exercise program.  She notes that daily activities are much easier now and overall she feels to have returned to normal function..                ROS:  All systems reviewed are negative as they relate to the chief complaint within the history of present illness.  Patient denies fevers or chills.  Assessment & Plan: Visit Diagnoses: No diagnosis found.  Plan: Patient is a 64 year old female who presents status post home exercise program for frozen shoulder after left clavicle fixation.  Overall she is made very good progress.  She is happy with her clinical result.  Okay for activity as tolerated and follow-up with [place] as needed  Follow-Up Instructions: No follow-ups on file.   Orders:  No orders of the defined types were placed in this encounter.  No orders of the defined types were placed in this  encounter.     Procedures: No procedures performed   Clinical Data: No additional findings.  Objective: Vital Signs: There were no vitals taken for this visit.  Physical Exam:   Constitutional: Patient appears well-developed HEENT:  Head: Normocephalic Eyes:EOM are normal Neck: Normal range of motion Cardiovascular: Normal rate Pulmonary/chest: Effort normal Neurologic: Patient is alert Skin: Skin is warm Psychiatric: Patient has normal mood and affect    Ortho Exam: Ortho exam demonstrates improved forward flexion abduction on that left-hand side.  Only lacking about 10 degrees compared to the right-hand side.  Clavicle itself is nontender with the incision intact and hardware palpable but not prominent.  Rotator cuff strength is good on that left-hand side  Specialty Comments:  No specialty comments available.  Imaging: No results found.   PMFS History: Patient Active Problem List   Diagnosis Date Noted  . Nausea 07/27/2018  . Clavicle fracture, shaft 07/27/2018  . Osteopenia 12/20/2015  . Allergy to cats 12/19/2015  . Asthma, mild intermittent 12/19/2015  . Hyperlipidemia 09/19/2014  . Hot flashes 09/19/2014   Past Medical History:  Diagnosis Date  . Allergy   . Anemia   . Asthma   . Clavicle fracture    Left  . Dyslipidemia   . Leukopenia    mild  . Osteopenia   . Overactive  bladder     Family History  Problem Relation Age of Onset  . Hyperlipidemia Mother   . Cancer Father     Past Surgical History:  Procedure Laterality Date  . ABDOMINAL HYSTERECTOMY    . ORIF CLAVICULAR FRACTURE Left 07/27/2018   OPEN REDUCTION INTERNAL FIXATION (ORIF) LEFT CLAVICULAR FRACTURE AND ILIAC CREST BONE MARROW ASPIRATION FOR GRAFTING  . ORIF CLAVICULAR FRACTURE Left 07/27/2018   Procedure: OPEN REDUCTION INTERNAL FIXATION (ORIF) LEFT CLAVICULAR FRACTURE AND POSSIBLE BONE MARROW ASPIRATION FOR GRAFTING;  Surgeon: Meredith Pel, MD;  Location: [place];   Service: Orthopedics;  Laterality: Left;   Social History   Occupational History  . Occupation: Pharmacist, hospital  Tobacco Use  . Smoking status: Former Smoker    Packs/day: 0.50    Years: 13.00    Pack years: 6.50    Types: Cigarettes    Start date: 05/26/1978    Quit date: 10/22/1991    Years since quitting: 27.5  . Smokeless tobacco: Never Used  . Tobacco comment: quit  13 years ago  Substance and Sexual Activity  . Alcohol use: No    Alcohol/week: 0.0 standard drinks  . Drug use: No  . Sexual activity: Not Currently

## 2019-04-30 ENCOUNTER — Encounter: Payer: Self-pay | Admitting: Orthopedic Surgery

## 2019-05-09 ENCOUNTER — Ambulatory Visit: Payer: BC Managed Care – PPO | Attending: Internal Medicine

## 2019-05-09 DIAGNOSIS — Z23 Encounter for immunization: Secondary | ICD-10-CM

## 2019-05-09 NOTE — Progress Notes (Signed)
   Covid-19 Vaccination Clinic  Name:  Megan Hooper    MRN: [id number] DOB: 04-07-1955  05/09/2019  [name] was observed post Covid-19 immunization for 15 minutes without incidence. She was provided with Vaccine Information Sheet and instruction to access the V-Safe system.   [name] was instructed to call 911 with any severe reactions post vaccine: Marland Kitchen Difficulty breathing  . Swelling of your face and throat  . A fast heartbeat  . A bad rash all over your body  . Dizziness and weakness    Immunizations Administered    Name Date Dose VIS Date Route   Pfizer COVID-19 Vaccine 05/09/2019  5:11 PM 0.3 mL 03/11/2019 Intramuscular   Manufacturer: [place]   Lot: [id number]   [place]: [id number]

## 2019-06-03 ENCOUNTER — Ambulatory Visit: Payer: BLUE CROSS/BLUE SHIELD | Attending: Internal Medicine

## 2019-06-03 DIAGNOSIS — Z23 Encounter for immunization: Secondary | ICD-10-CM | POA: Insufficient documentation

## 2019-06-03 NOTE — Progress Notes (Signed)
   Covid-19 Vaccination Clinic  Name:  Megan Hooper    MRN: [id number] DOB: 1956-03-17  06/03/2019  [name] was observed post Covid-19 immunization for 15 minutes without incident. She was provided with Vaccine Information Sheet and instruction to access the V-Safe system.   [name] was instructed to call 911 with any severe reactions post vaccine: Marland Kitchen Difficulty breathing  . Swelling of face and throat  . A fast heartbeat  . A bad rash all over body  . Dizziness and weakness   Immunizations Administered    Name Date Dose VIS Date Route   Pfizer COVID-19 Vaccine 06/03/2019  5:40 PM 0.3 mL 03/11/2019 Intramuscular   Manufacturer: [place]   Lot: [id number]   [place]: [id number]

## 2019-06-13 ENCOUNTER — Encounter: Payer: Self-pay | Admitting: Physical Therapy

## 2019-06-14 ENCOUNTER — Encounter: Payer: Self-pay | Admitting: Physical Therapy

## 2019-08-31 ENCOUNTER — Telehealth: Payer: Self-pay | Admitting: Family Medicine

## 2019-08-31 NOTE — Telephone Encounter (Signed)
Called pt LVM for her to call our office and schedule appt for her Megan Hooper

## 2019-09-01 ENCOUNTER — Other Ambulatory Visit: Payer: Self-pay

## 2019-09-01 ENCOUNTER — Ambulatory Visit: Payer: BLUE CROSS/BLUE SHIELD | Admitting: Family Medicine

## 2019-11-10 ENCOUNTER — Encounter: Payer: BLUE CROSS/BLUE SHIELD | Admitting: Family Medicine

## 2019-11-17 ENCOUNTER — Encounter: Payer: Self-pay | Admitting: Family Medicine

## 2019-11-17 ENCOUNTER — Ambulatory Visit (INDEPENDENT_AMBULATORY_CARE_PROVIDER_SITE_OTHER): Payer: BC Managed Care – PPO | Admitting: Family Medicine

## 2019-11-17 ENCOUNTER — Other Ambulatory Visit: Payer: Self-pay

## 2019-11-17 VITALS — BP 131/86 | HR 70 | Temp 98.2°F | Ht 65.0 in | Wt 157.0 lb

## 2019-11-17 DIAGNOSIS — Z1211 Encounter for screening for malignant neoplasm of colon: Secondary | ICD-10-CM

## 2019-11-17 DIAGNOSIS — Z23 Encounter for immunization: Secondary | ICD-10-CM

## 2019-11-17 DIAGNOSIS — Z0000 Encounter for general adult medical examination without abnormal findings: Secondary | ICD-10-CM

## 2019-11-17 DIAGNOSIS — Z0001 Encounter for general adult medical examination with abnormal findings: Secondary | ICD-10-CM | POA: Diagnosis not present

## 2019-11-17 DIAGNOSIS — Z0189 Encounter for other specified special examinations: Secondary | ICD-10-CM | POA: Diagnosis not present

## 2019-11-17 DIAGNOSIS — Z1231 Encounter for screening mammogram for malignant neoplasm of breast: Secondary | ICD-10-CM | POA: Diagnosis not present

## 2019-11-17 DIAGNOSIS — J4520 Mild intermittent asthma, uncomplicated: Secondary | ICD-10-CM | POA: Diagnosis not present

## 2019-11-17 DIAGNOSIS — Z79899 Other long term (current) drug therapy: Secondary | ICD-10-CM

## 2019-11-17 LAB — CMP14+EGFR
ALT: 24 IU/L (ref 0–32)
AST: 20 IU/L (ref 0–40)
Albumin/Globulin Ratio: 2.4 — ABNORMAL HIGH (ref 1.2–2.2)
Albumin: 4.6 g/dL (ref 3.8–4.8)
Alkaline Phosphatase: 91 IU/L (ref 48–121)
BUN/Creatinine Ratio: 29 — ABNORMAL HIGH (ref 12–28)
BUN: 19 mg/dL (ref 8–27)
Bilirubin Total: 0.4 mg/dL (ref 0.0–1.2)
CO2: 23 mmol/L (ref 20–29)
Calcium: 9.6 mg/dL (ref 8.7–10.3)
Chloride: 104 mmol/L (ref 96–106)
Creatinine, Ser: 0.65 mg/dL (ref 0.57–1.00)
GFR calc Af Amer: 110 mL/min/{1.73_m2} (ref >59)
GFR calc non Af Amer: 95 mL/min/{1.73_m2} (ref >59)
Globulin, Total: 1.9 g/dL (ref 1.5–4.5)
Glucose: 94 mg/dL (ref 65–99)
Potassium: 4.5 mmol/L (ref 3.5–5.2)
Sodium: 143 mmol/L (ref 134–144)
Total Protein: 6.5 g/dL (ref 6.0–8.5)

## 2019-11-17 LAB — LIPID PANEL
Chol/HDL Ratio: 3.6 ratio (ref 0.0–4.4)
Cholesterol, Total: 319 mg/dL — ABNORMAL HIGH (ref 100–199)
HDL: 88 mg/dL (ref >39)
LDL Chol Calc (NIH): 215 mg/dL — ABNORMAL HIGH (ref 0–99)
Triglycerides: 97 mg/dL (ref 0–149)
VLDL Cholesterol Cal: 16 mg/dL (ref 5–40)

## 2019-11-17 LAB — HEMOGLOBIN A1C
Est. average glucose Bld gHb Est-mCnc: 108 mg/dL
Hgb A1c MFr Bld: 5.4 % (ref 4.8–5.6)

## 2019-11-17 MED ORDER — ALBUTEROL SULFATE HFA 108 (90 BASE) MCG/ACT IN AERS: 1.0000 | INHALATION_SPRAY | RESPIRATORY_TRACT | 0 refills | Status: AC | PRN

## 2019-11-17 NOTE — Patient Instructions (Signed)
Continue taking over-the-counter ibuprofen 200 mg 3 pills 3 times daily or Aleve (naproxen) 220 mg 2 pills twice daily as needed for pain.  Use these on an as-needed basis.  They can upset your stomach.  In addition to that you can take acetaminophen (Tylenol) 500 mg 2 pills 3 times daily.  Do not exceed 3000 mg in 24 hours.  You can also try over-the-counter diclofenac gel for the knees.  If you keep having too much the problem, see your orthopedic doctor to discuss options.  Generally I would recommend pressing on until you cannot tolerate the pain before you proceed on to surgery.  Bone density mammogram are being scheduled.  Immunizations being updated with Tdap and shingles.  Get an annual flu shot.  Get the Covid booster when available for you.  Return annually or as needed  Continue to seek to be physically, emotionally, relationally, and spiritually healthy.

## 2019-11-17 NOTE — Progress Notes (Signed)
Patient ID: Megan Hooper, female    DOB: 1955-09-24  Age: 64 y.o. MRN: [id number]  Chief Complaint  Patient presents with  . Annual Exam  . chronic knee pain    Subjective:   64 year old lady who is here for annual physical examination.  She is a little late on it because of Covid restrictions.  She has no major complaints except for the arthritic aches and pains in her joints.  Especially her knees.  Occasional swelling in the knee.  OTC medication does not seem to give her a lot of relief.  Past medical history: mirabegron ER for overactive bladder OTC analgesia Past medical illnesses: Seasonal allergy: Surgeries: Hysterectomy Clavicle repair  Family history: Mother is living and well.  Father died of stomach cancer  Social history: Does not smoke drink or use drugs.  She is in a a which she has been for many years and stays clean.  She is divorced.  Not sexually involved.  Dates.  She works as a Surveyor, minerals.  Has college education.  She is in a meditation group along with going to [place].  She does have adopted twin girls 63 years old.  Review of systems: Constitutional: Unremarkable HEENT: Unremarkable Cardiovascular: Unremarkable Respiratory: Unremarkable GI: Unremarkable GU: Unremarkable Endocrine: Cold intolerance Musculoskeletal: DJD of knees Skin: Unremarkable Allergy: Seasonal allergies in spring Neurologic: Unremarkable Hematologic: Unremarkable Psychiatric: Unremarkable     Current allergies, medications, problem list, past/family and social histories reviewed.  Objective:  BP 131/86   Pulse 70   Temp 98.2 F (36.8 C)   Ht 5' 5"  (1.651 m)   Wt 157 lb (71.2 kg)   SpO2 97%   BMI 26.13 kg/m   Healthy-appearing lady in no major distress.  TMs normal.  Eyes PERRL.  EOMs intact.  Throat clear.  Neck supple without nodes or thyromegaly.  No carotid bruits.  Chest clear to auscultation.  Heart rate without murmur.  Abdomen soft without mass or tenderness.  No  axillary or inguinal nodes.  Extremities unremarkable.  Skin unremarkable.  Assessment & Plan:   Assessment: 1. Annual physical exam   2. Mild intermittent asthma without complication   3. Encounter for bone density measurement for therapeutic drug monitoring   4. Encounter for screening mammogram for malignant neoplasm of breast   5. Screening for colon cancer       Plan: See instructions.  Orders Placed This Encounter  Procedures  . MM DIGITAL SCREENING BILATERAL    Standing Status:   Future    Standing Expiration Date:   11/16/2020    Scheduling Instructions:     Schedule where previous one done    Order Specific Question:   Reason for Exam (SYMPTOM  OR DIAGNOSIS REQUIRED)    Answer:   screening    Order Specific Question:   Preferred imaging location?    Answer:   External  . DG Bone Density    Standing Status:   Future    Standing Expiration Date:   11/16/2020    Scheduling Instructions:     Schedule where previous one was done    Order Specific Question:   Reason for Exam (SYMPTOM  OR DIAGNOSIS REQUIRED)    Answer:   screening    Order Specific Question:   Preferred imaging location?    Answer:   External  . Varicella-zoster vaccine IM  . Tdap vaccine greater than or equal to 7yo IM  . Lipid panel  . CMP14+EGFR  . Cologuard  .  Hemoglobin A1c    Meds ordered this encounter  Medications  . albuterol (VENTOLIN HFA) 108 (90 Base) MCG/ACT inhaler    Sig: Inhale 1-2 puffs into the lungs every 4 (four) hours as needed for wheezing or shortness of breath.    Dispense:  18 g    Refill:  0         Patient Instructions  Continue taking over-the-counter ibuprofen 200 mg 3 pills 3 times daily or Aleve (naproxen) 220 mg 2 pills twice daily as needed for pain.  Use these on an as-needed basis.  They can upset your stomach.  In addition to that you can take acetaminophen (Tylenol) 500 mg 2 pills 3 times daily.  Do not exceed 3000 mg in 24 hours.  You can also try  over-the-counter diclofenac gel for the knees.  If you keep having too much the problem, see your orthopedic doctor to discuss options.  Generally I would recommend pressing on until you cannot tolerate the pain before you proceed on to surgery.  Bone density mammogram are being scheduled.  Immunizations being updated with Tdap and shingles.  Get an annual flu shot.  Get the Covid booster when available for you.  Return annually or as needed  Continue to seek to be physically, emotionally, relationally, and spiritually healthy.    No follow-ups on file.   Ruben Reason, MD 11/17/2019

## 2019-11-18 ENCOUNTER — Other Ambulatory Visit: Payer: Self-pay | Admitting: Family Medicine

## 2019-11-18 DIAGNOSIS — E7800 Pure hypercholesterolemia, unspecified: Secondary | ICD-10-CM

## 2019-11-18 MED ORDER — ROSUVASTATIN CALCIUM 5 MG PO TABS: 5.0000 mg | ORAL_TABLET | Freq: Every day | ORAL | 1 refills | Status: DC

## 2019-11-27 DIAGNOSIS — Z1211 Encounter for screening for malignant neoplasm of colon: Secondary | ICD-10-CM | POA: Diagnosis not present

## 2019-12-06 LAB — COLOGUARD: Cologuard: NEGATIVE

## 2020-01-06 DIAGNOSIS — N3946 Mixed incontinence: Secondary | ICD-10-CM | POA: Diagnosis not present

## 2020-01-13 DIAGNOSIS — M8589 Other specified disorders of bone density and structure, multiple sites: Secondary | ICD-10-CM | POA: Diagnosis not present

## 2020-01-13 DIAGNOSIS — Z1231 Encounter for screening mammogram for malignant neoplasm of breast: Secondary | ICD-10-CM | POA: Diagnosis not present

## 2020-01-13 LAB — HM DEXA SCAN

## 2020-01-13 LAB — HM MAMMOGRAPHY

## 2020-05-21 ENCOUNTER — Telehealth: Payer: Self-pay

## 2020-05-21 MED ORDER — ROSUVASTATIN CALCIUM 5 MG PO TABS: 5.0000 mg | ORAL_TABLET | Freq: Every day | ORAL | 0 refills | Status: AC

## 2020-05-21 NOTE — Telephone Encounter (Signed)
90 day RX has been sent to pharmacy.

## 2020-05-21 NOTE — Telephone Encounter (Signed)
Pt. Called requesting refill on rosuvastatin until may 15th. Pt. Asserted she had moved to [place] and was unable to come into the office for an appt. She has a new PCP in [place] that she will see in may but needs the medication filled until that time.   If filled the pharmacy is [facility] at [address]

## 2020-07-27 ENCOUNTER — Other Ambulatory Visit: Payer: Self-pay | Admitting: Family Medicine

## 2021-01-05 IMAGING — DX LEFT SHOULDER - 2+ VIEW
3 series · 3 of 3 positions shown · non-contrast
Comparison: None.

CLINICAL DATA: Fall 2 days ago, with persistent left shoulder pain.

EXAM:
LEFT SHOULDER - 2+ VIEW

[shoulder neutral ap]
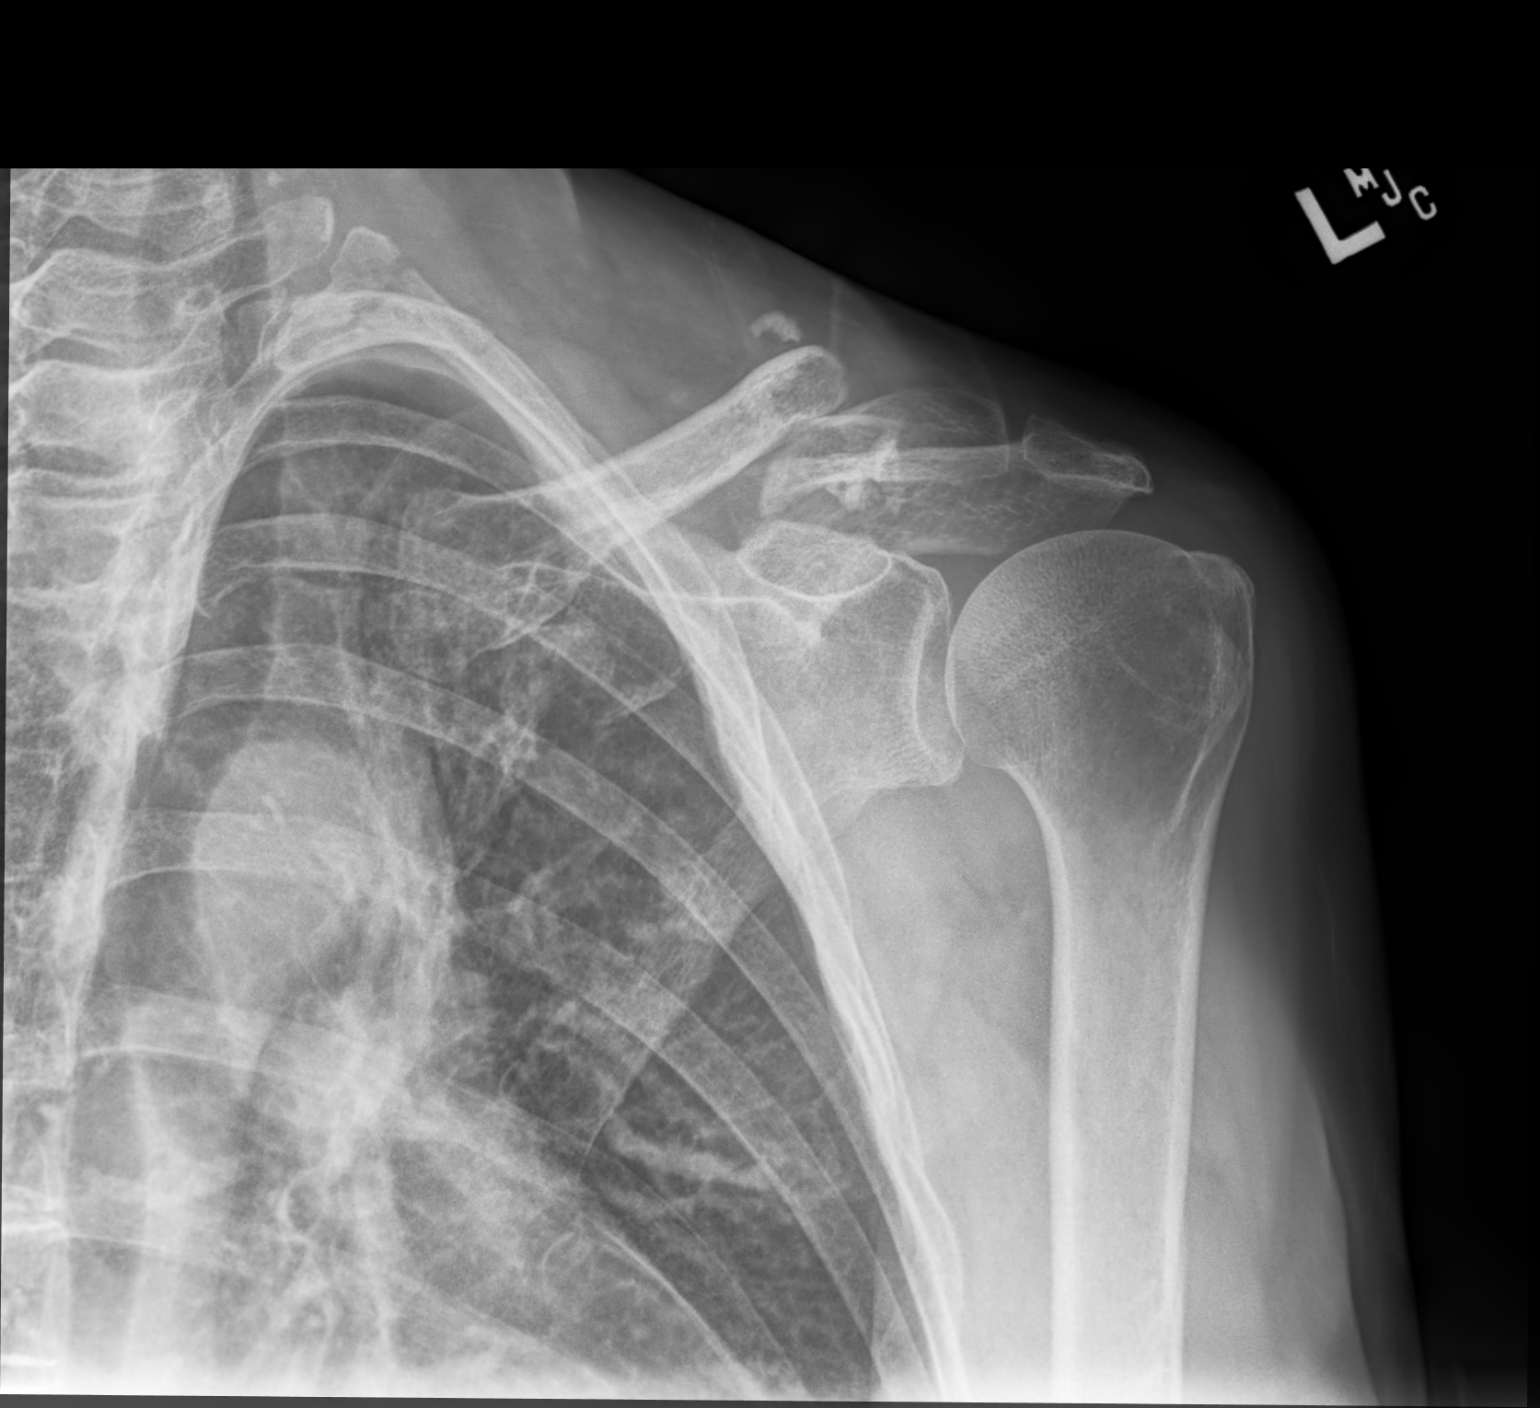

[shoulder transscapular y view (neer)]
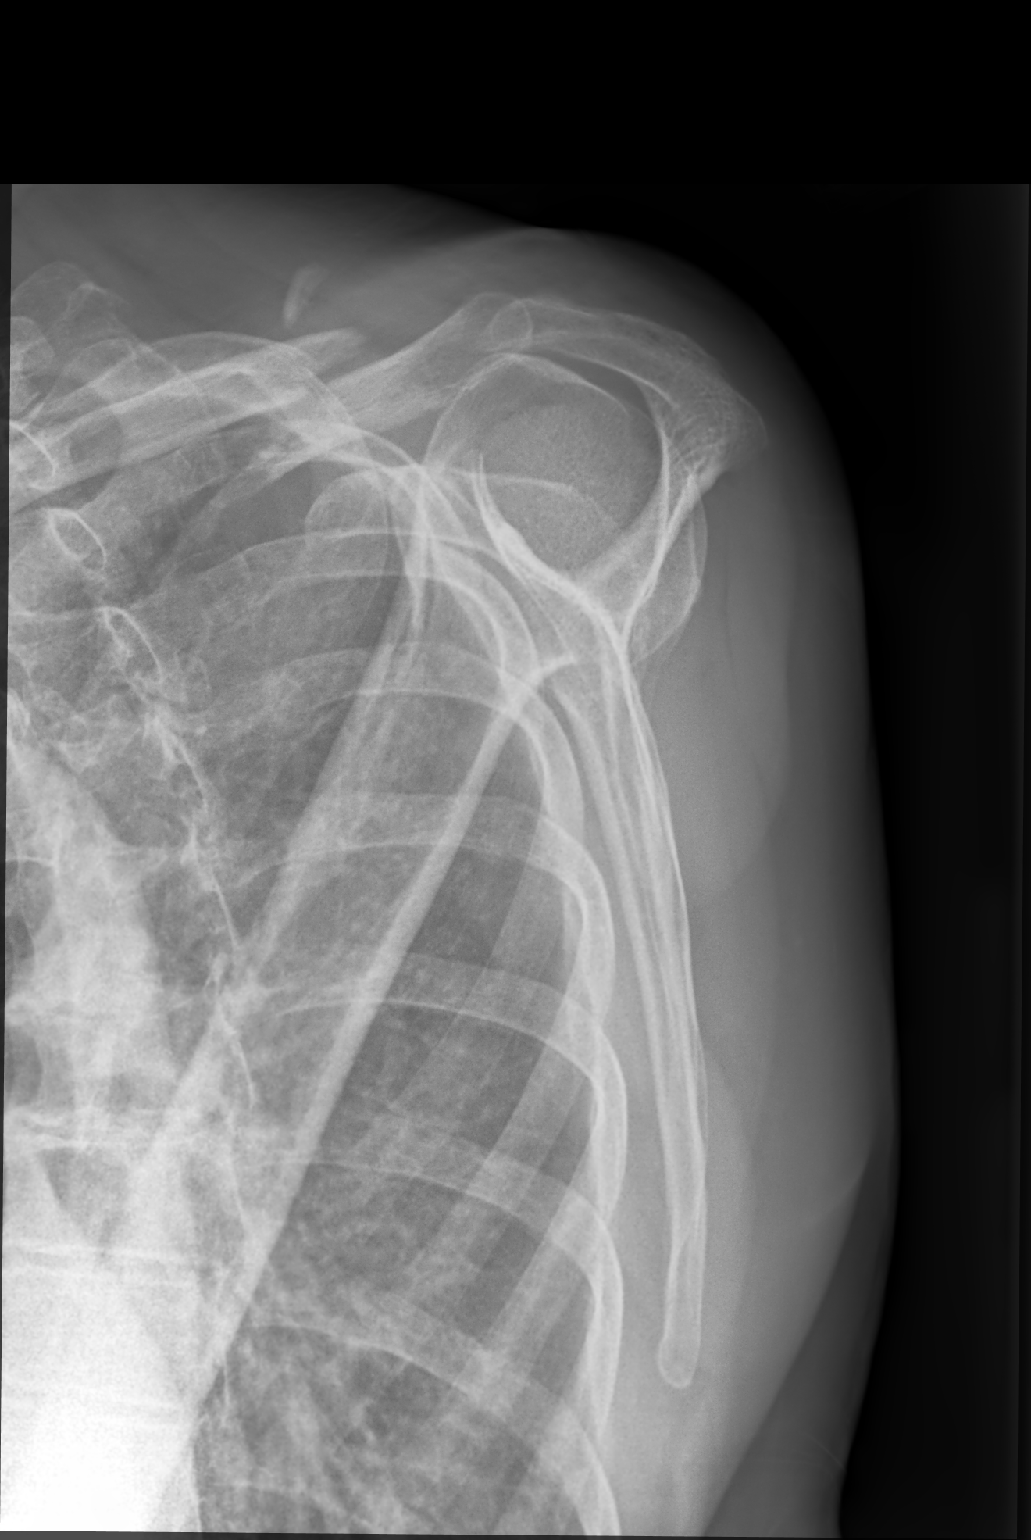

[shoulder axial 5° seated]
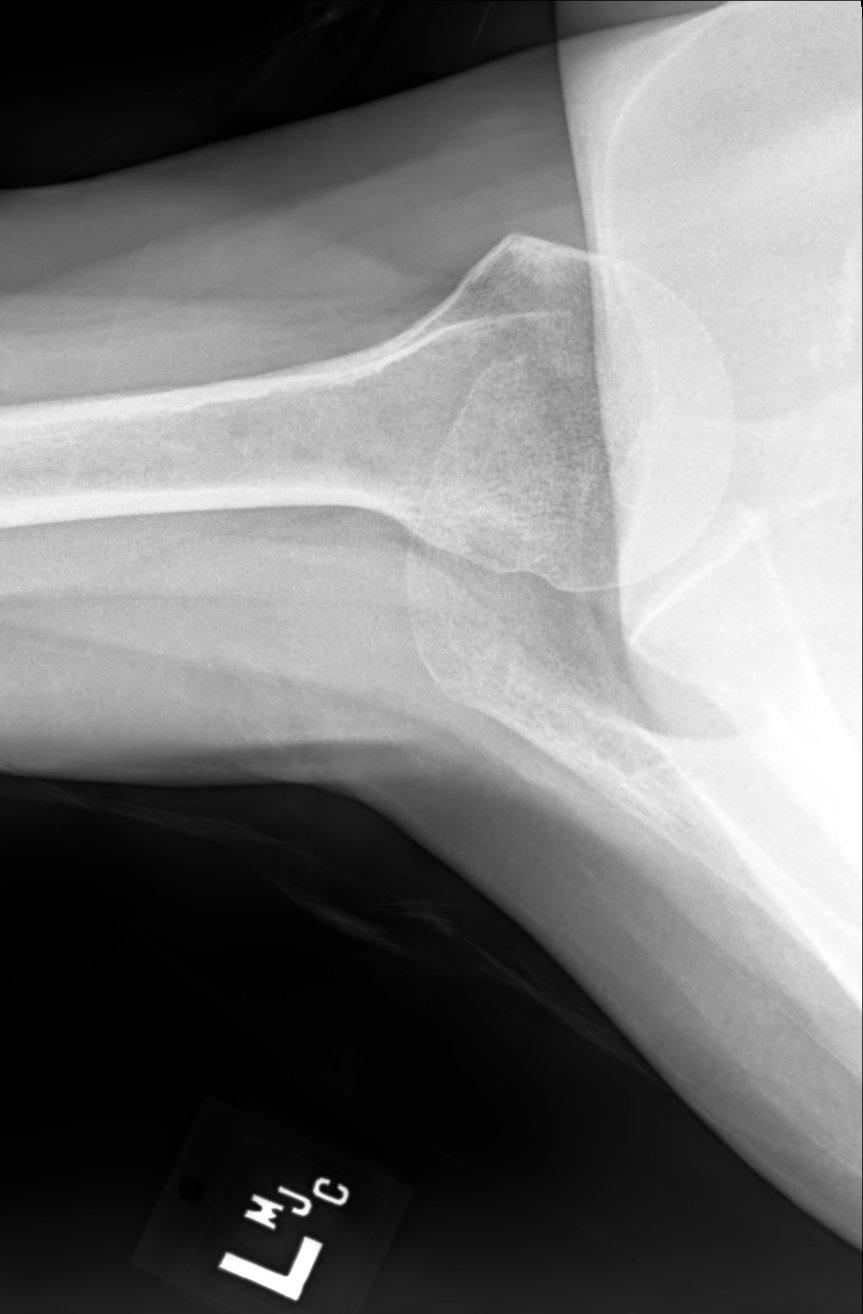

[3 of 3 positions shown; findings below may reference images not displayed]

FINDINGS: There is an acute, displaced fracture involving the distal third of
the clavicle with associated foreshortening. There is an
approximately 1.3 cm displaced ossicle adjacent to the main fracture
site. No definitive extension to involve the acromioclavicular
joint. Glenohumeral joint spaces appear preserved.

Limited visualization of the adjacent thorax suggests a potential
minimally displaced fracture involving the posterior aspect of the
left first rib. No pneumothorax.

Adjacent soft tissue swelling.  No radiopaque foreign body.
IMPRESSION: 1. Acute, displaced clavicular fracture without definitive extension
to the acromioclavicular joint.
2. Suspected nondisplaced fracture involving the posterior aspect of
the left first rib.

## 2021-01-20 IMAGING — RF DG C-ARM 61-120 MIN
1 series · 2 of 2 positions shown · non-contrast
Comparison: Radiographs July 22, 2018.

CLINICAL DATA: Status post open reduction of left clavicular
fracture.

EXAM:
DG C-ARM 61-120 MIN; LEFT CLAVICLE - 2+ VIEWS
FLUOROSCOPY TIME:  8 seconds.

[Series 1: run · 2 of 2 slices shown]
[im 1/2]
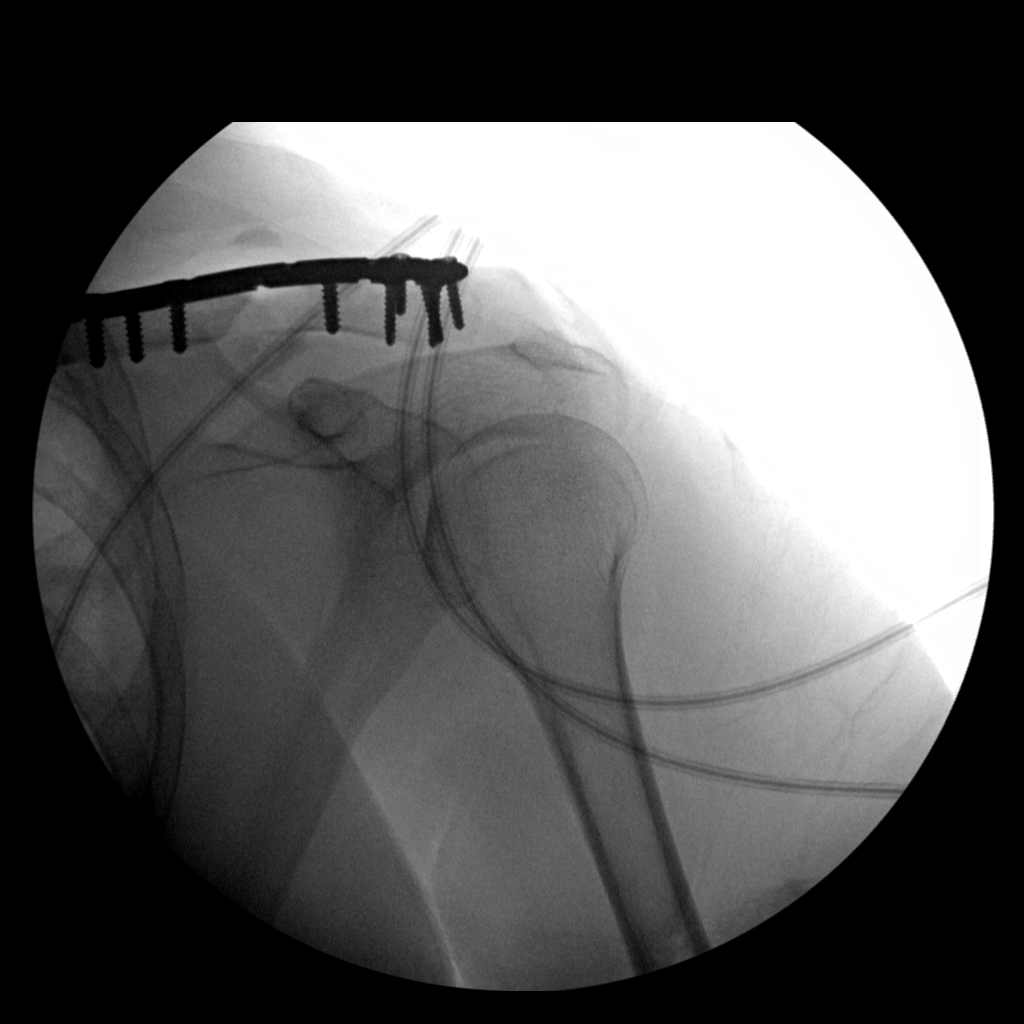
[im 2/2]
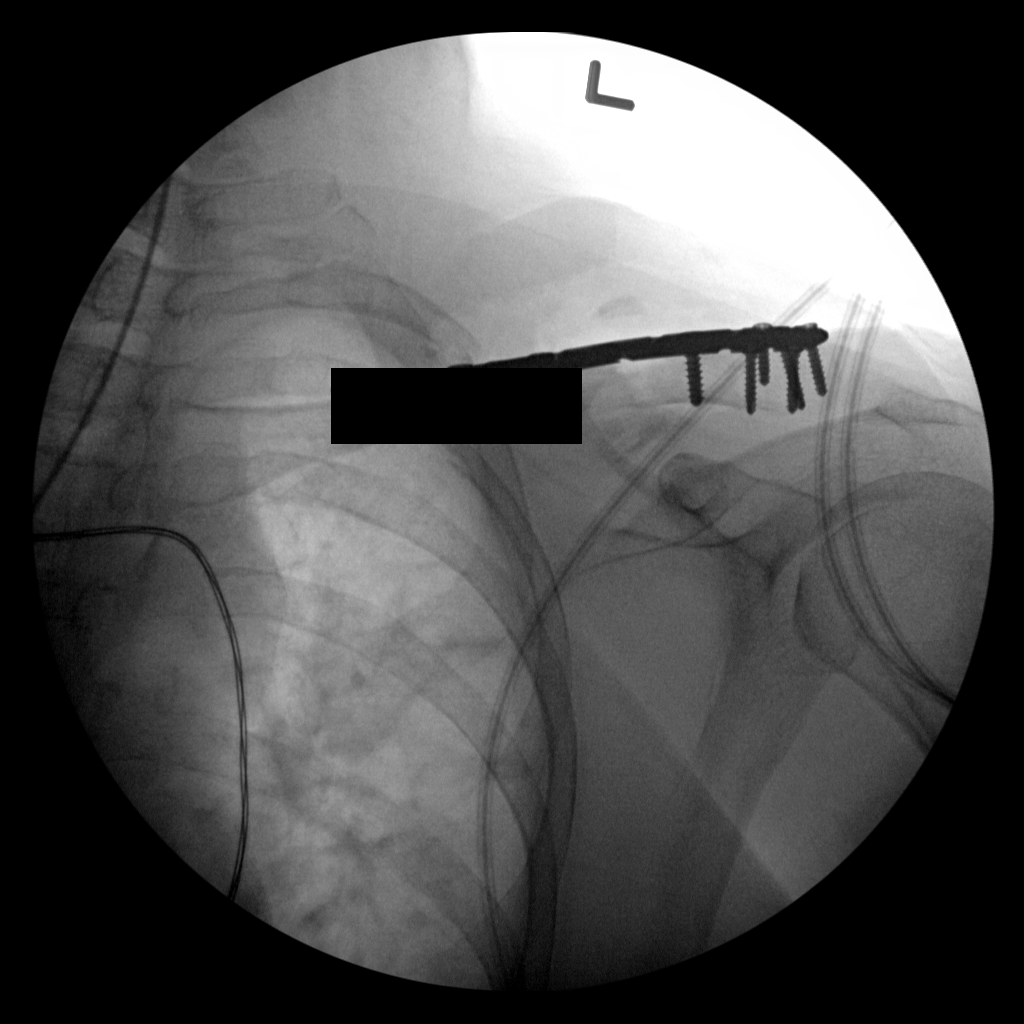

[2 of 2 positions shown; findings below may reference images not displayed]

FINDINGS: Two intraoperative fluoroscopic images demonstrate surgical internal
fixation of left clavicular fracture. Good alignment of fracture
components is noted. Mildly displaced left second and third rib
fractures are noted.
IMPRESSION: Status post surgical internal fixation of left clavicular fracture.
